# Patient Record
Sex: Female | Born: 1965
Health system: Southern US, Community
[De-identification: ages and names within clinical notes are randomized; demographics above are authoritative.]

## PROBLEM LIST (undated history)

## (undated) DIAGNOSIS — F419 Anxiety disorder, unspecified: Secondary | ICD-10-CM

## (undated) DIAGNOSIS — M797 Fibromyalgia: Secondary | ICD-10-CM

## (undated) DIAGNOSIS — G43909 Migraine, unspecified, not intractable, without status migrainosus: Secondary | ICD-10-CM

## (undated) DIAGNOSIS — M810 Age-related osteoporosis without current pathological fracture: Secondary | ICD-10-CM

## (undated) DIAGNOSIS — M1990 Unspecified osteoarthritis, unspecified site: Secondary | ICD-10-CM

## (undated) DIAGNOSIS — E079 Disorder of thyroid, unspecified: Secondary | ICD-10-CM

## (undated) DIAGNOSIS — K219 Gastro-esophageal reflux disease without esophagitis: Secondary | ICD-10-CM

## (undated) HISTORY — PX: TUBAL LIGATION: SHX77

## (undated) HISTORY — DX: Gastro-esophageal reflux disease without esophagitis: K21.9

## (undated) HISTORY — DX: Anxiety disorder, unspecified: F41.9

## (undated) HISTORY — DX: Age-related osteoporosis without current pathological fracture: M81.0

## (undated) HISTORY — PX: TONSILLECTOMY: SUR1361

## (undated) HISTORY — PX: OTHER SURGICAL HISTORY: SHX169

## (undated) HISTORY — DX: Unspecified osteoarthritis, unspecified site: M19.90

---

## 2001-07-23 ENCOUNTER — Encounter: Payer: Self-pay | Admitting: Emergency Medicine

## 2001-07-23 ENCOUNTER — Emergency Department (HOSPITAL_COMMUNITY): Admission: EM | Admit: 2001-07-23 | Discharge: 2001-07-23 | Payer: Self-pay | Admitting: Emergency Medicine

## 2002-10-24 HISTORY — PX: OTHER SURGICAL HISTORY: SHX169

## 2003-05-15 ENCOUNTER — Other Ambulatory Visit: Admission: RE | Admit: 2003-05-15 | Discharge: 2003-05-15 | Payer: Self-pay | Admitting: Obstetrics and Gynecology

## 2004-01-14 ENCOUNTER — Emergency Department (HOSPITAL_COMMUNITY): Admission: EM | Admit: 2004-01-14 | Discharge: 2004-01-14 | Payer: Self-pay | Admitting: Emergency Medicine

## 2004-06-07 ENCOUNTER — Other Ambulatory Visit: Admission: RE | Admit: 2004-06-07 | Discharge: 2004-06-07 | Payer: Self-pay | Admitting: Obstetrics and Gynecology

## 2004-08-24 ENCOUNTER — Ambulatory Visit (HOSPITAL_COMMUNITY): Admission: RE | Admit: 2004-08-24 | Discharge: 2004-08-24 | Payer: Self-pay | Admitting: Internal Medicine

## 2005-05-19 ENCOUNTER — Encounter (INDEPENDENT_AMBULATORY_CARE_PROVIDER_SITE_OTHER): Payer: Self-pay | Admitting: Specialist

## 2005-05-19 ENCOUNTER — Ambulatory Visit (HOSPITAL_COMMUNITY): Admission: RE | Admit: 2005-05-19 | Discharge: 2005-05-19 | Payer: Self-pay | Admitting: Obstetrics and Gynecology

## 2005-10-05 ENCOUNTER — Emergency Department (HOSPITAL_COMMUNITY): Admission: EM | Admit: 2005-10-05 | Discharge: 2005-10-05 | Payer: Self-pay | Admitting: Emergency Medicine

## 2005-11-02 ENCOUNTER — Other Ambulatory Visit: Admission: RE | Admit: 2005-11-02 | Discharge: 2005-11-02 | Payer: Self-pay | Admitting: Obstetrics and Gynecology

## 2006-01-29 ENCOUNTER — Emergency Department (HOSPITAL_COMMUNITY): Admission: EM | Admit: 2006-01-29 | Discharge: 2006-01-29 | Payer: Self-pay | Admitting: Emergency Medicine

## 2006-03-21 ENCOUNTER — Ambulatory Visit (HOSPITAL_COMMUNITY): Admission: RE | Admit: 2006-03-21 | Discharge: 2006-03-21 | Payer: Self-pay | Admitting: Internal Medicine

## 2006-08-10 ENCOUNTER — Ambulatory Visit (HOSPITAL_COMMUNITY): Admission: RE | Admit: 2006-08-10 | Discharge: 2006-08-10 | Payer: Self-pay | Admitting: Internal Medicine

## 2007-05-23 ENCOUNTER — Ambulatory Visit: Payer: Self-pay | Admitting: Internal Medicine

## 2007-06-03 ENCOUNTER — Emergency Department (HOSPITAL_COMMUNITY): Admission: EM | Admit: 2007-06-03 | Discharge: 2007-06-03 | Payer: Self-pay | Admitting: Emergency Medicine

## 2007-07-05 ENCOUNTER — Ambulatory Visit: Payer: Self-pay | Admitting: Surgery

## 2007-07-05 ENCOUNTER — Ambulatory Visit (HOSPITAL_COMMUNITY): Admission: RE | Admit: 2007-07-05 | Discharge: 2007-07-05 | Payer: Self-pay | Admitting: Orthopaedic Surgery

## 2007-09-06 ENCOUNTER — Encounter
Admission: RE | Admit: 2007-09-06 | Discharge: 2007-09-06 | Payer: Self-pay | Admitting: Physical Medicine and Rehabilitation

## 2007-11-22 ENCOUNTER — Ambulatory Visit (HOSPITAL_COMMUNITY): Admission: RE | Admit: 2007-11-22 | Discharge: 2007-11-22 | Payer: Self-pay | Admitting: Internal Medicine

## 2008-07-02 ENCOUNTER — Encounter (HOSPITAL_COMMUNITY): Admission: RE | Admit: 2008-07-02 | Discharge: 2008-07-21 | Payer: Self-pay | Admitting: Sports Medicine

## 2008-10-30 ENCOUNTER — Ambulatory Visit (HOSPITAL_COMMUNITY): Admission: RE | Admit: 2008-10-30 | Discharge: 2008-10-30 | Payer: Self-pay | Admitting: Internal Medicine

## 2009-01-03 ENCOUNTER — Emergency Department (HOSPITAL_COMMUNITY): Admission: EM | Admit: 2009-01-03 | Discharge: 2009-01-03 | Payer: Self-pay | Admitting: Emergency Medicine

## 2010-03-25 ENCOUNTER — Ambulatory Visit (HOSPITAL_COMMUNITY): Admission: RE | Admit: 2010-03-25 | Discharge: 2010-03-25 | Payer: Self-pay | Admitting: Family Medicine

## 2011-01-31 ENCOUNTER — Other Ambulatory Visit (HOSPITAL_COMMUNITY): Payer: Self-pay | Admitting: Family Medicine

## 2011-02-01 ENCOUNTER — Ambulatory Visit (HOSPITAL_COMMUNITY)
Admission: RE | Admit: 2011-02-01 | Discharge: 2011-02-01 | Disposition: A | Discharge status: Home / Self Care | Payer: Self-pay | Source: Ambulatory Visit | Attending: Family Medicine | Admitting: Family Medicine

## 2011-02-01 ENCOUNTER — Other Ambulatory Visit (HOSPITAL_COMMUNITY): Payer: Self-pay | Admitting: Family Medicine

## 2011-02-01 DIAGNOSIS — R1032 Left lower quadrant pain: Secondary | ICD-10-CM | POA: Insufficient documentation

## 2011-02-03 ENCOUNTER — Ambulatory Visit (HOSPITAL_COMMUNITY): Payer: Self-pay

## 2011-03-08 NOTE — Assessment & Plan Note (Signed)
[facility]                             PULMONARY OFFICE NOTE   JAKARA, BLATTER                  MRN:          [id number]  DATE:05/23/2007                            DOB:          Jun 02, 1966    REASON FOR CONSULTATION:  Pain in the back and abnormal chest x-ray.   HISTORY:  This is a 45 year old white female who has developed chest  discomfort for the last two years that is midline in nature, present 24  hours a day and not related to coughing or deep breathing.  It seems  better when she does heat or massage.  She comes in today because she is  having increasing symptoms of morning cough and congestion and  increasing dyspnea on her treadmill after resuming smoking two months  ago.  She states prior to that she had better exercise tolerance and no  cough at all.  She is concerned because she had pneumonia a year ago.  She had normal chest x-ray and PFTs a year ago but has not had any  follow-up.   PAST MEDICAL HISTORY:  Significant for headaches only. She sees a  psychiatrist.  Could not tell me his name initially and does not  actually know what diagnosis she is being followed for.   ALLERGIES:  NO KNOWN DRUG ALLERGIES.   MEDICATIONS:  Multivitamins, Lamictal and Depakote.   SOCIAL HISTORY:  She quit smoking a year ago but then resumed two months  ago which corresponds to when her symptoms started.   FAMILY HISTORY:  Positive for heart disease in her father.   REVIEW OF SYSTEMS:  Taken in detail and negative except as outlined  above.   PHYSICAL EXAMINATION:  VITAL SIGNS:  Afebrile with stable vital signs.  GENERAL APPEARANCE:  This is a somber and anxious, but not overtly  depressed-appearing ambulatory white female in no acute distress.  HEENT:  Unremarkable.  Oropharynx clear. Dentition intact.  Nasal  turbinates reveal minimum nonspecific turbinate edema.  Ear canals were  clear bilaterally.  NECK:  Supple without cervical  adenopathy or tenderness.  Trachea  midline.  No thyromegaly.  LUNGS:  Lung fields perfectly clear to auscultation and percussion  bilaterally with excellent air movement.  CARDIOVASCULAR:  There is regular rhythm without murmurs, rubs, or  gallops.  ABDOMEN:  Soft, benign.  EXTREMITIES:  Warm without calf tenderness, clubbing, cyanosis, or  edema.   IMPRESSION:  I was not exactly sure why this patient was here today.  She clearly understands that the cigarettes are chronologically directly  related to the symptoms that she is having, namely morning cough and  congestion that clears fairly readily after she stirs around and  worsening exercise tolerance on a treadmill.  Rather than treat her with  medicines to correct this, I think it makes more sense for her to return  to her psychiatrist for help quitting smoking.  She indeed does seem  motivated but said the reason she resumed smoking is because she could  not handle the stress which is exactly why she should discuss this with  her psychiatrist over her  pulmonologist.   I told her that no amount of screening with chest x-rays and pulmonary  function tests will be as important as her commitment and follow-through  on smoking cessation but I would be happy to perform a set of pulmonary  function tests and chest x-ray here to reassure her that the studies  done in [place], [place], were accurate (I reviewed them with  her today and find that she had a normal chest x-ray and pulmonary  function tests a year ago so by extrapolation it would be unlikely she  has permanent lung injury from smoking which was her main concern).  In terms of her back pain, it has been present for two years.  It is  midline in nature and is not effected by breathing or coughing, twisting  or turning.  I thought it was a bit peculiar in that it is present 24  hours a day without change except better with heat or massage, strongly  suggesting a  musculoskeletal component.  It is midline in nature, not  lateralizing and therefore, not likely to be related to the thoracic  cage and certainly not of pulmonary etiology and therefore does not need  any further evaluation at this point.     Charlaine Dalton. Sherene Sires, MD, [facility]  Electronically Signed    MBW/MedQ  DD: 05/23/2007  DT: 05/24/2007  Job #: [id number]   cc:   Kingsley Callander. Ouida Sills, MD  Jetty Duhamel., M.D.

## 2011-03-11 NOTE — Op Note (Signed)
Mary [name], Mary [name]           ACCOUNT NO.:  [id number]   MEDICAL RECORD NO.:  [id number]          PATIENT TYPE:  AMB   LOCATION:  SDC                           FACILITY:  [facility]   PHYSICIAN:  Juluis Mire, M.D.   DATE OF BIRTH:  11-07-1965   DATE OF PROCEDURE:  05/19/2005  DATE OF DISCHARGE:                                 OPERATIVE REPORT   PREOPERATIVE DIAGNOSES:  1.  Pelvic pain.  2.  Menorrhagia.   POSTOPERATIVE DIAGNOSES:  1.  Uterine adenomyosis.  2.  Pelvic adhesion.   OPERATIVE PROCEDURE:  1.  Open laparoscopy.  2.  Lysis of adhesion.  3.  Hysteroscopy.  4.  Endometrial curettings.  5.  NovaSure endometrial ablation.   SURGEON:  Dr. Arelia Sneddon   ANESTHESIA:  General.   ESTIMATED BLOOD LOSS:  Minimal.   PACKS AND DRAINS:  None.   INTRAOPERATIVE BLOOD REPLACEMENT AND COMPLICATIONS:  None.   INDICATIONS:  Dictated in history and physical.   DESCRIPTION OF PROCEDURE:  The patient was taken to the OR and placed in  supine position.  After satisfactory level of general endotracheal  anesthesia was obtained, the patient was placed in the dorsal lithotomy  position using the Allen stirrups.  Abdomen, perineum, and vagina were  prepped out with Betadine.  Bladder was emptied by in-and-out  catheterization.  A Hulka tenaculum put in place and secured.  The patient  was then draped out for laparoscopy.  Subumbilical incision was made with a  knife and carried through the subcutaneous tissue.  Fascia was entered  sharply and the incision in the fascia extended laterally.  Muscles were  separated in the midline.  The peritoneum was entered with digital pressure.  __________ laparoscopic trocar was put in place and secured.  Abdomen was  inflated with carbon dioxide.  Laparoscope was introduced.  There was no  evidence of injury to adjacent organs.  A 5 mm trocar was put in place in  the suprapubic area under direct visualization.  Visualization revealed a  markedly  enlarged uterus, consistent with adenomyosis.  Previous bilateral  tubal ligation.  Functional cysts bilaterally.  Did have adhesions from the  left pelvic sidewall to the left ovary.  These were taken down using the  scissors.  We used cautery to bring about hemostasis.  The ovaries are  otherwise unremarkable.  This did completely free up the left side.  Appendix was visualized and noted to be normal.  __________ gallbladder were  clear.  We had no active bleeding or signs of injury to adjacent organs.  The abdomen was deflated of its carbon dioxide.  Trocars were removed.  Subumbilical fascia was closed with two figure-of-eights of 0 Vicryl, skin  with interrupted subcuticulars of 4-0 Vicryl.  The suprapubic incisions were  closed with Dermabond.   The patient's legs were repositioned.  The patient was made ready for  hysteroscopy.  Speculum was placed in the vaginal vault.  The Hulka  tenaculums had been removed.  Cervix was grasped with single-tooth  tenaculum.  Uterine depth was 9 cm.  Endocervical length was 5 cm.  Cervix  serially dilated to a size 25 Pratt dilator.  Nonoperative hysteroscope was  introduced.  The intrauterine cavity was distended using lactated Ringer's.  Visualization revealed a normal endometrial cavity.  There was no evidence  of polyps or other abnormalities.  Endometrial curettings were then  obtained.  The NovaSure was brought in place, properly inserted.  It was  expanded.  Total cavity length was 3.7 cm.  We then passed the carbon  dioxide patency test.  The NovaSure was then fired at a power of 81 for 1  minute and 9 seconds.  NovaSure was then removed intact.  Rehysteroscopic  evaluation revealed complete ablation of the endometrium.  There were no  signs of perforation or other complications.  Total deficit from  hysteroscopy was 30 mL.  At this point in time, the single-tooth tenaculum  and speculum were then removed.  The patient taken out of the  dorsal  lithotomy position, once alert and extubated, transferred to the recovery  room in good condition.  Sponge, instrument, and needle count reported as  correct by the circulating nurse.       JSM/MEDQ  D:  05/19/2005  T:  05/19/2005  Job:  [id number]

## 2011-03-11 NOTE — H&P (Signed)
NAMELEITHA, [name] NO.:  [id number]   MEDICAL RECORD NO.:  [id number]          PATIENT TYPE:  AMB   LOCATION:  SDC                           FACILITY:  [facility]   PHYSICIAN:  Juluis Mire, M.D.   DATE OF BIRTH:  [birth year]   DATE OF ADMISSION:  05/19/2005  DATE OF DISCHARGE:                                HISTORY & PHYSICAL   The patient is a 45 year old gravida 6, para 3, abortus 3, married female  who presents for diagnostic laparoscopy with laser stand-by as well as  hysteroscopy and NovaSure ablation.   In relation to the present admission, the patient has had trouble with  chronic pelvic pain and discomfort.  Ultrasound evaluation has been  basically unremarkable.  We presumptively thought we were dealing with  pelvic endometriosis.  We are going to proceed with laparoscopic evaluation  for management.   The patient also describes menorrhagia.  Her cycles are regular.  She has  four to five days of flow, three days being heavy, changing pads and tampons  every hour, with clots and increasing dysmenorrhea.  Saline infusion  ultrasound was basically unremarkable.  We had discussed options, including  birth control pills versus Mirena IUD versus conservative follow-up.  The  patient decided to proceed with ablation at the time of hysteroscopy.   In terms of allergies, the patient has no known drug allergies.   MEDICATIONS:  None.   PAST MEDICAL HISTORY:  Usual childhood diseases.   PREVIOUS SURGICAL HISTORY:  Bilateral tubal ligation.   OBSTETRICAL HISTORY:  She has had three vaginal deliveries and three  miscarriages.   FAMILY HISTORY:  Noncontributory.   SOCIAL HISTORY:  No tobacco or alcohol use.   REVIEW OF SYSTEMS:  Noncontributory.   PHYSICAL EXAMINATION:  VITAL SIGNS:  The patient is afebrile with stable  vital signs.  HEENT:  Patient normocephalic.  Pupils equal, round, and reactive to light  and accommodation, extraocular movements were  intact.  Sclerae and  conjunctivae clear.  NECK:  Without thyromegaly.  BREASTS:  No discrete masses.  CHEST:  Lungs clear.  CARDIAC:  Regular rhythm and rate, without murmurs or gallops.  ABDOMEN:  Benign.  No mass, organomegaly or tenderness.  PELVIC:  Normal external genitalia.  Vaginal mucosa clear.  Cervix  unremarkable.  Uterus upper limits of normal size, moderately boggy,  possible adenomyosis.  Adnexa unremarkable.  EXTREMITIES:  Trace edema.  NEUROLOGIC:  Grossly within normal limits.   IMPRESSION:  Menorrhagia with associated pelvic pain, possible endometriosis  versus adenomyosis.   PLAN:  The patient will undergo diagnostic laparoscopy with laser stand-by,  subsequent hysteroscopy and NovaSure ablation.  Success rate for ablation of  80% is quoted.  The risks of surgery are explained, including the risk of  infection; the risk of vascular injury that could lead to hemorrhage  requiring transfusion of possible hysterectomy; risk of injury to adjacent  organs that could require further exploratory surgery; the risk of deep  venous thrombosis and pulmonary embolus.  The patient expressed an  understanding of indications and, indeed, risks.  JSM/MEDQ  D:  05/19/2005  T:  05/19/2005  Job:  [id number]

## 2011-03-11 NOTE — Procedures (Signed)
Mary [name], Mary [name]           ACCOUNT NO.:  [id number]   MEDICAL RECORD NO.:  [id number]           PATIENT TYPE:   LOCATION:                                 FACILITY:   PHYSICIAN:  Edward L. Juanetta Gosling, M.D.     DATE OF BIRTH:   DATE OF PROCEDURE:  DATE OF DISCHARGE:                              PULMONARY FUNCTION TEST   PROCEDURE:  Pulmonary function test.   INTERPRETATION:  1.  Spirometry is normal.  2.  Lung volumes are normal but there is slight increase in residual volume.  3.  DLCO is normal.     Edwa   ELH/MEDQ  D:  08/30/2004  T:  08/30/2004  Job:  [id number]   cc:   Vickki Hearing, M.D.  Fax: [phone]

## 2011-04-28 ENCOUNTER — Emergency Department (HOSPITAL_COMMUNITY)
Admission: EM | Admit: 2011-04-28 | Discharge: 2011-04-29 | Disposition: A | Discharge status: Home / Self Care | Payer: Worker's Compensation | Attending: Emergency Medicine | Admitting: Emergency Medicine

## 2011-04-28 ENCOUNTER — Emergency Department (HOSPITAL_COMMUNITY): Payer: Worker's Compensation

## 2011-04-28 DIAGNOSIS — S63509A Unspecified sprain of unspecified wrist, initial encounter: Secondary | ICD-10-CM | POA: Insufficient documentation

## 2011-04-28 DIAGNOSIS — Z79899 Other long term (current) drug therapy: Secondary | ICD-10-CM | POA: Insufficient documentation

## 2011-04-28 DIAGNOSIS — W010XXA Fall on same level from slipping, tripping and stumbling without subsequent striking against object, initial encounter: Secondary | ICD-10-CM | POA: Insufficient documentation

## 2011-04-28 DIAGNOSIS — F411 Generalized anxiety disorder: Secondary | ICD-10-CM | POA: Insufficient documentation

## 2011-04-28 DIAGNOSIS — F319 Bipolar disorder, unspecified: Secondary | ICD-10-CM | POA: Insufficient documentation

## 2011-07-08 ENCOUNTER — Other Ambulatory Visit: Payer: Self-pay

## 2011-07-08 ENCOUNTER — Emergency Department (HOSPITAL_COMMUNITY): Payer: BC Managed Care – PPO

## 2011-07-08 ENCOUNTER — Encounter: Payer: Self-pay | Admitting: Emergency Medicine

## 2011-07-08 ENCOUNTER — Emergency Department (HOSPITAL_COMMUNITY)
Admission: EM | Admit: 2011-07-08 | Discharge: 2011-07-08 | Disposition: A | Discharge status: Home / Self Care | Payer: BC Managed Care – PPO | Attending: Emergency Medicine | Admitting: Emergency Medicine

## 2011-07-08 DIAGNOSIS — IMO0001 Reserved for inherently not codable concepts without codable children: Secondary | ICD-10-CM | POA: Insufficient documentation

## 2011-07-08 DIAGNOSIS — J40 Bronchitis, not specified as acute or chronic: Secondary | ICD-10-CM | POA: Insufficient documentation

## 2011-07-08 DIAGNOSIS — F17200 Nicotine dependence, unspecified, uncomplicated: Secondary | ICD-10-CM | POA: Insufficient documentation

## 2011-07-08 HISTORY — DX: Fibromyalgia: M79.7

## 2011-07-08 MED ORDER — PREDNISONE 20 MG PO TABS
20.0000 mg | ORAL_TABLET | Freq: Once | ORAL | Status: AC
  Administered 2011-07-08: 20 mg via ORAL
  Filled 2011-07-08: qty 1

## 2011-07-08 MED ORDER — PREDNISONE 10 MG PO TABS: 20.0000 mg | ORAL_TABLET | Freq: Every day | ORAL | Status: AC

## 2011-07-08 NOTE — ED Provider Notes (Signed)
History   Scribed for Benny Lennert, MD, the patient was seen in room APA02/APA02. This chart was scribed by Clarita Crane. This patient's care was started at 1:58PM.  CSN: [id number] Arrival date & time: 07/08/2011 11:37 AM  Chief Complaint  Patient presents with  . Chest Pain  . Cough    HPI Mary [name] is a 45 y.o. female who presents to the Emergency Department complaining of cough and burning chest pain which is worse with coughing onset several days ago and persistent since with associated myalgias, fatigue, cough, back pain, left shoulder pain and numbness. Patient reports she was evaluated by PCP 3 days ago and prescribed a Codeine cough syrup with mild relief of cough and ore throat but no relief provided to additional symptoms. Patient is a current smoker. H/o fibromyalgia.   HPI ELEMENTS: Onset: several days ago Duration: persistent since onset  Timing: constant   Quality: chest pain described as burning Modifying factors: chest pain aggravated by coughing Context:  as above  Associated symptoms: +myalgias, fatigue, cough, back pain, left shoulder pain and numbness.Marland Kitchen  PAST MEDICAL HISTORY:  Past Medical History  Diagnosis Date  . Fibromyalgia     PAST SURGICAL HISTORY:  Past Surgical History  Procedure Date  . Tubal ligation   . Tonsillectomy     FAMILY HISTORY:  History reviewed. No pertinent family history.   SOCIAL HISTORY: History   Social History  . Marital Status: Married    Spouse Name: N/A    Number of Children: N/A  . Years of Education: N/A   Social History Main Topics  . Smoking status: Current Some Day Smoker  . Smokeless tobacco: None  . Alcohol Use: No  . Drug Use: No  . Sexually Active: Yes    Birth Control/ Protection: Surgical   Other Topics Concern  . None   Social History Narrative  . None     Review of Systems  Constitutional: Positive for fatigue. Negative for fever.  HENT: Negative for congestion, sinus pressure  and ear discharge.   Eyes: Negative for discharge.  Respiratory: Positive for cough.   Cardiovascular: Positive for chest pain.  Gastrointestinal: Negative for abdominal pain and diarrhea.  Genitourinary: Negative for frequency and hematuria.  Musculoskeletal: Positive for myalgias and back pain.       Left shoulder pain  Skin: Negative for rash.  Neurological: Positive for numbness. Negative for seizures and headaches.  Hematological: Negative.   Psychiatric/Behavioral: Negative for hallucinations.    Allergies  Review of patient's allergies indicates no known allergies.  Home Medications   Current Outpatient Rx  Name Route Sig Dispense Refill  . ACETAMINOPHEN 500 MG PO TABS Oral Take 1,000 mg by mouth every 6 (six) hours as needed. Pain     . AZITHROMYCIN 250 MG PO TABS Oral Take 250 mg by mouth daily. Take 2 tablets by mouth one first day then take 1 tablet by mouth for 4 days.     . ESCITALOPRAM OXALATE 20 MG PO TABS Oral Take 20 mg by mouth daily.      Marland Kitchen HYDROCODONE-HOMATROPINE 5-1.5 MG/5ML PO SYRP Oral Take 5 mLs by mouth every 6 (six) hours as needed. Congestion     . ALIVE WOMENS ENERGY PO TABS Oral Take 1 tablet by mouth daily.        Physical Exam    BP 105/55  Pulse 65  Temp(Src) 98.1 F (36.7 C) (Oral)  Resp 18  Ht 5\' 4"  (1.626 m)  Wt 170 lb (77.111 kg)  BMI 29.18 kg/m2  SpO2 99%  Physical Exam  Nursing note and vitals reviewed. Constitutional: She is oriented to person, place, and time. She appears well-developed and well-nourished. No distress.  HENT:  Head: Normocephalic and atraumatic.  Mouth/Throat: Oropharynx is clear and moist.  Eyes: Conjunctivae and EOM are normal. No scleral icterus.  Neck: Neck supple. No thyromegaly present.  Cardiovascular: Normal rate and regular rhythm.  Exam reveals no gallop and no friction rub.   No murmur heard. Pulmonary/Chest: Effort normal. No stridor. She has wheezes (minimal in bilateral fields). She has no rales.  She exhibits no tenderness.  Abdominal: She exhibits no distension. There is no tenderness. There is no rebound.  Musculoskeletal: Normal range of motion. She exhibits no edema.  Lymphadenopathy:    She has no cervical adenopathy.  Neurological: She is oriented to person, place, and time. Coordination normal.  Skin: Skin is warm and dry.  Psychiatric: She has a normal mood and affect. Her behavior is normal.    ED Course  Procedures  OTHER DATA REVIEWED: Nursing notes, vital signs, and past medical records reviewed. Lab results reviewed and considered Imaging results reviewed and considered  DIAGNOSTIC STUDIES:   LABS / RADIOLOGY: No results found for this or any previous visit. Dg Chest 2 View  07/08/2011  *RADIOLOGY REPORT*  Clinical Data: Chest pain with nonproductive cough for 1 week. Smoker  CHEST - 2 VIEW  Comparison: 10/30/2008  Findings: Heart and mediastinal contours are within normal limits. The lung fields are well expanded and appear clear with no signs of focal infiltrate or congestive failure.  No pleural fluid or peribronchial cuffing is seen.  Bony structures appear intact  IMPRESSION: Stable cardiopulmonary appearance with no worrisome focal focal or acute abnormality identified.  Original Report Authenticated By: Bertha Stakes, M.D.    ED COURSE / COORDINATION OF CARE: Orders Placed This Encounter  Procedures  . DG Chest 2 View  . Cardiac monitoring  . ED EKG     MDM:    PLAN: Discharge Home The patient is to return the emergency department if there is any worsening of symptoms. I have reviewed the discharge instructions with the patient/family  CONDITION ON DISCHARGE: Good  MEDICATIONS GIVEN IN THE E.D.  Medications  azithromycin (ZITHROMAX) 250 MG tablet (not administered)  acetaminophen (TYLENOL) 500 MG tablet (not administered)  Multiple Vitamins-Minerals (ALIVE WOMENS ENERGY) TABS (not administered)  escitalopram (LEXAPRO) 20 MG tablet  (not administered)  HYDROcodone-homatropine (HYCODAN) 5-1.5 MG/5ML syrup (not administered)  predniSONE (DELTASONE) tablet 20 mg (not administered)       The chart was scribed for me under my direct supervision.  I personally performed the history, physical, and medical decision making and all procedures in the evaluation of this patient.Marland Kitchen   ement}  Benny Lennert, MD 07/08/11 [phone]

## 2011-07-08 NOTE — ED Notes (Signed)
Patient sent directly to ER from doctors office for midsternal chest pain that radiates into back and down left arm and dizziness. Patient reports nausea, denies any vomiting. Patient having some shortness of breath.  Patient seen at [facility]P on Tuesday for flu. Cough noted. Patient reports having heartburn last night. Pain worse in chest with movement.

## 2011-07-08 NOTE — ED Notes (Signed)
Per Dr. Heywood Iles labs until seen by him. Advised lab and pt.

## 2011-08-15 ENCOUNTER — Other Ambulatory Visit (HOSPITAL_COMMUNITY): Payer: Self-pay | Admitting: Obstetrics and Gynecology

## 2011-08-15 DIAGNOSIS — Z139 Encounter for screening, unspecified: Secondary | ICD-10-CM

## 2011-08-19 ENCOUNTER — Ambulatory Visit (HOSPITAL_COMMUNITY)
Admission: RE | Admit: 2011-08-19 | Discharge: 2011-08-19 | Disposition: A | Discharge status: Home / Self Care | Payer: BC Managed Care – PPO | Source: Ambulatory Visit | Attending: Obstetrics and Gynecology | Admitting: Obstetrics and Gynecology

## 2011-08-19 DIAGNOSIS — Z139 Encounter for screening, unspecified: Secondary | ICD-10-CM

## 2011-08-19 DIAGNOSIS — Z1231 Encounter for screening mammogram for malignant neoplasm of breast: Secondary | ICD-10-CM | POA: Insufficient documentation

## 2011-09-20 ENCOUNTER — Other Ambulatory Visit (HOSPITAL_COMMUNITY): Payer: Self-pay | Admitting: "Endocrinology

## 2011-09-20 DIAGNOSIS — E049 Nontoxic goiter, unspecified: Secondary | ICD-10-CM

## 2011-09-26 ENCOUNTER — Ambulatory Visit (HOSPITAL_COMMUNITY)
Admission: RE | Admit: 2011-09-26 | Discharge: 2011-09-26 | Disposition: A | Discharge status: Home / Self Care | Payer: BC Managed Care – PPO | Source: Ambulatory Visit | Attending: "Endocrinology | Admitting: "Endocrinology

## 2011-09-26 DIAGNOSIS — E049 Nontoxic goiter, unspecified: Secondary | ICD-10-CM

## 2011-11-29 IMAGING — MG MM DIGITAL SCREENING BILAT W/ CAD
5 series · 5 of 5 positions shown · non-contrast
Comparison: none

DG SCREEN MAMMOGRAM BILATERAL
Bilateral CC and MLO view(s) were taken.
Technologist: [REDACTED]

DIGITAL SCREENING MAMMOGRAM WITH CAD:
The breast tissue is almost entirely fatty.  No masses or malignant type calcifications are 
identified.
Images were processed with CAD.

[L CC]
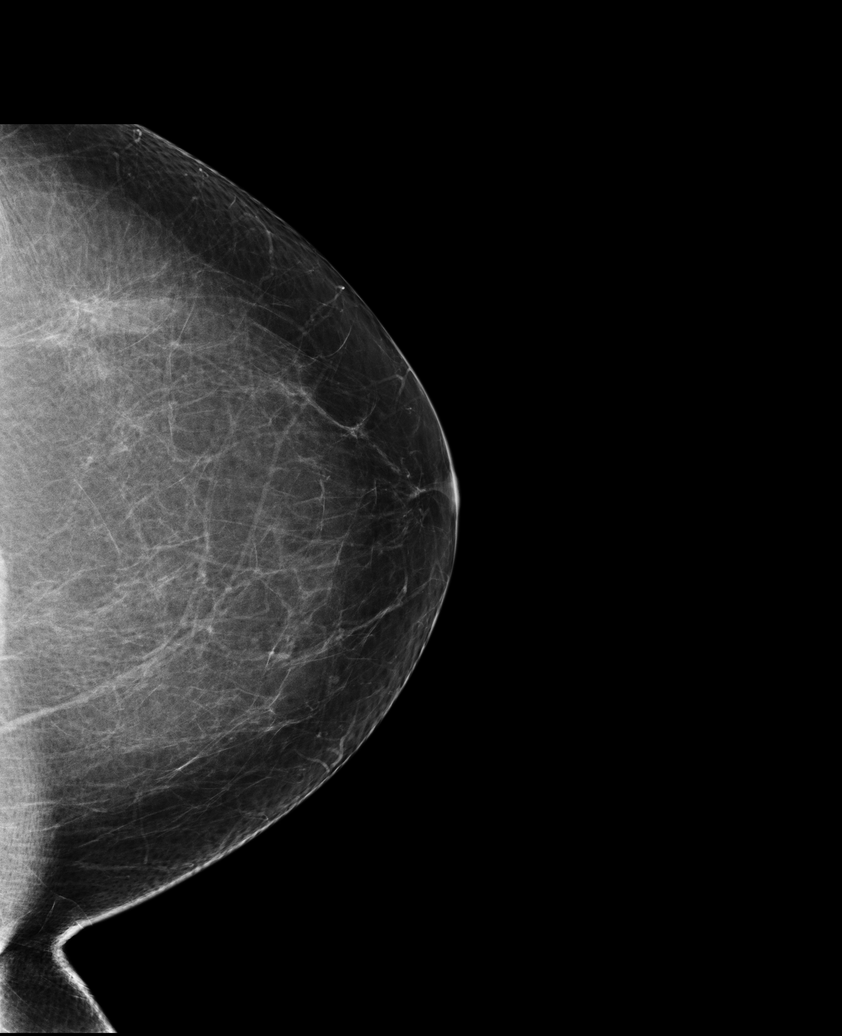

[L MLO]
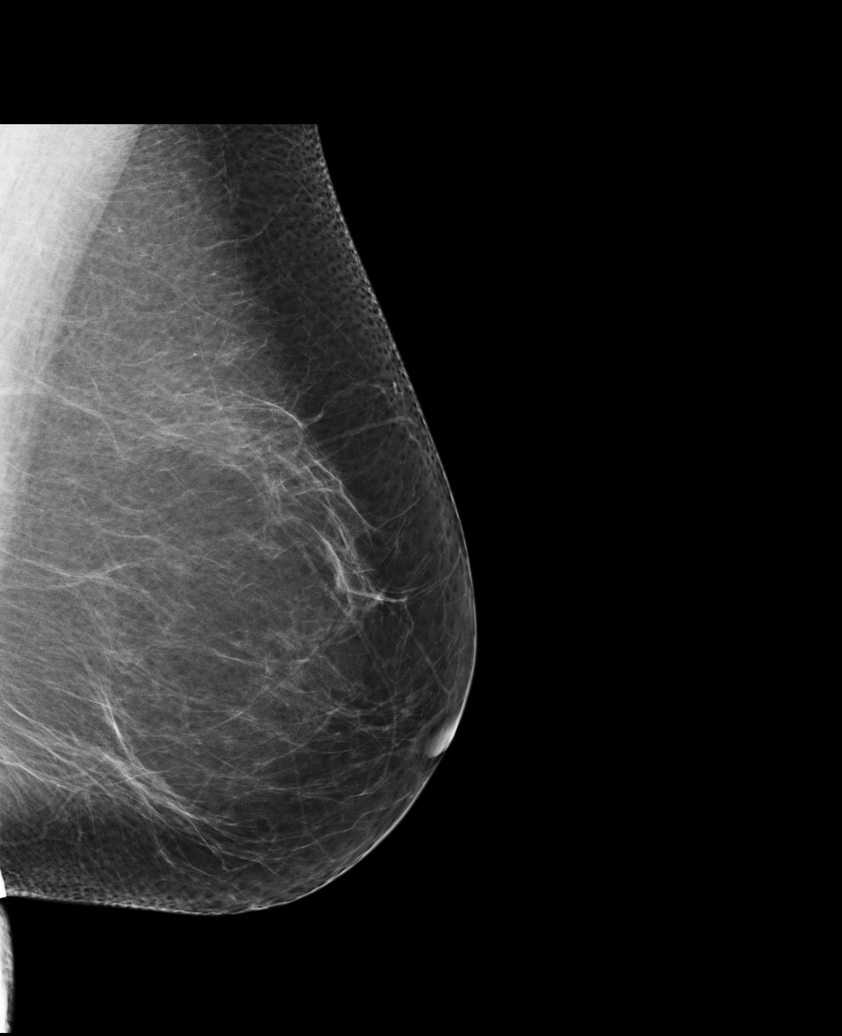

[R CC]
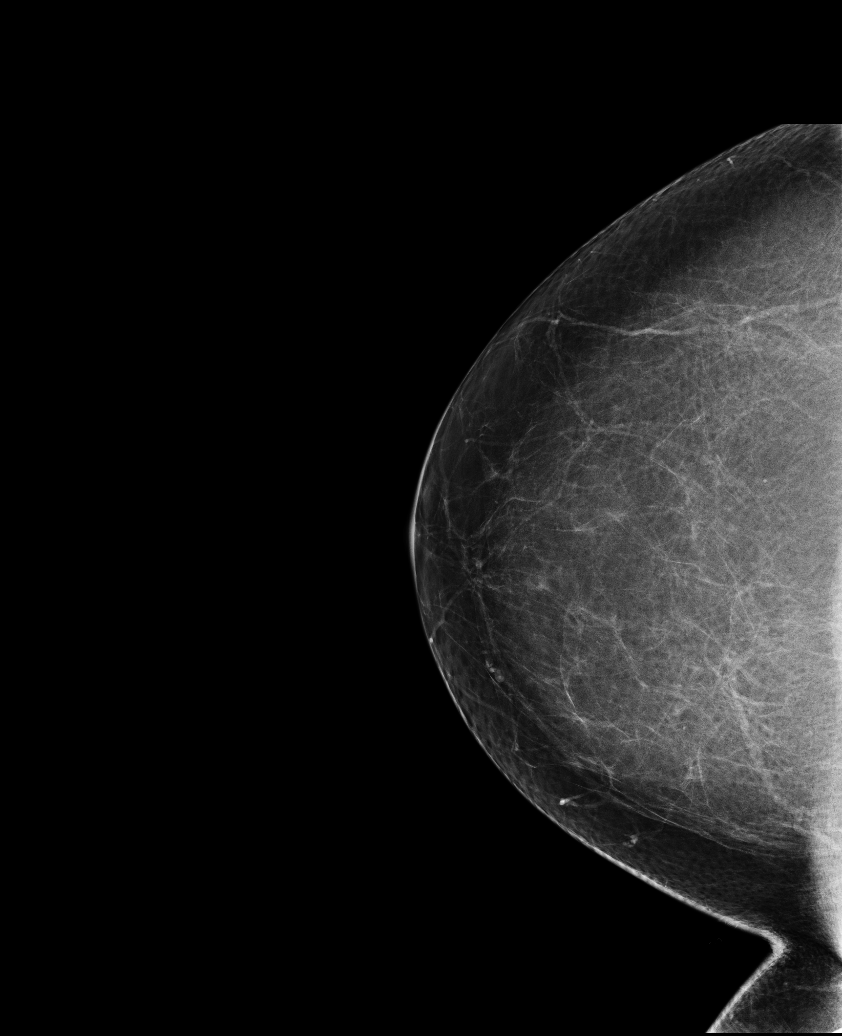

[R MLO (1 of 2)]
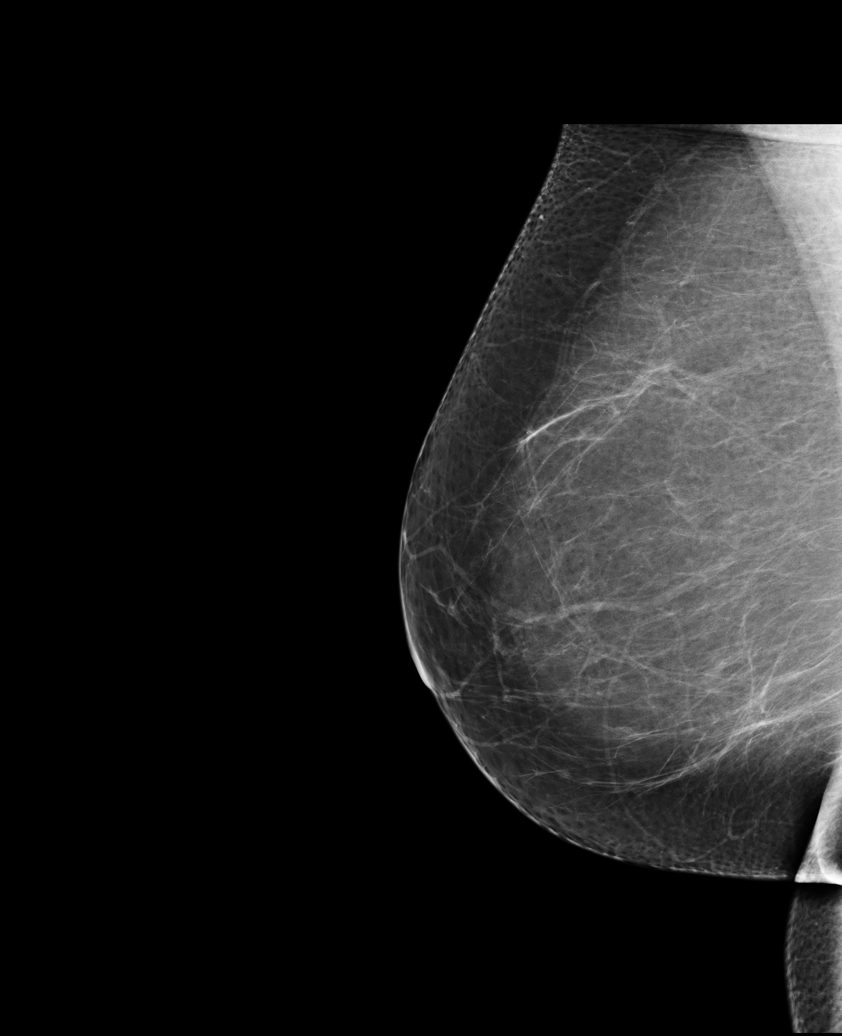

[R MLO (2 of 2)]
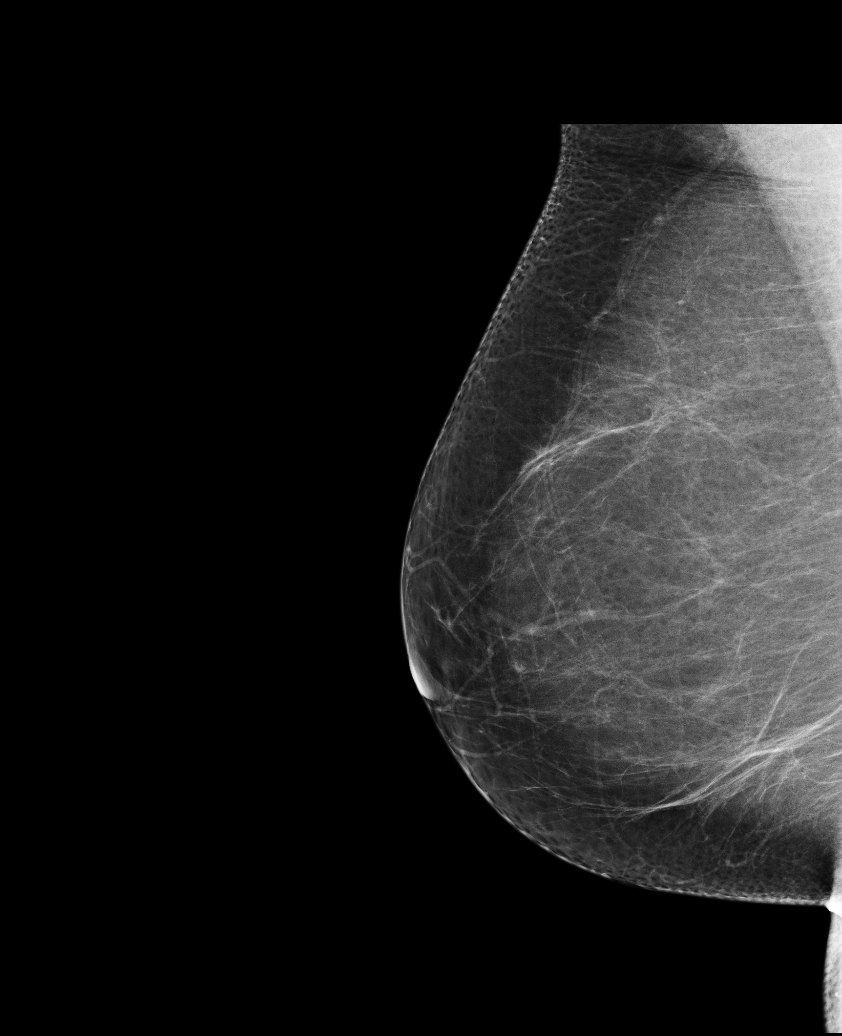

[5 of 5 positions shown; findings below may reference images not displayed]

IMPRESSION: No specific mammographic evidence of malignancy.  Next screening mammogram is recommended in one 
year.

A result letter of this screening mammogram will be mailed directly to the patient.

ASSESSMENT: Negative - BI-RADS 1

Screening mammogram in 1 year.
,

## 2012-01-06 IMAGING — US US SOFT TISSUE HEAD/NECK
1 series · 14 of 25 positions shown · non-contrast
Comparison: CT scan of the cervical spine dated 01/03/2009

CLINICAL DATA: Goiter.

THYROID ULTRASOUND
TECHNIQUE: Ultrasound examination of the thyroid gland and adjacent
soft tissues was performed.

[Series 1: us soft tissue head/neck · 0.08mm/px · 14 of 33 slices shown]
[im 1/33]
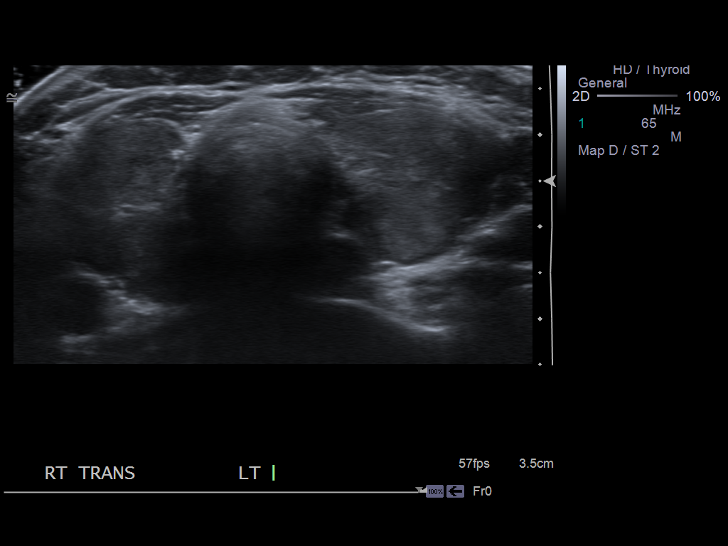
[im 3/33]
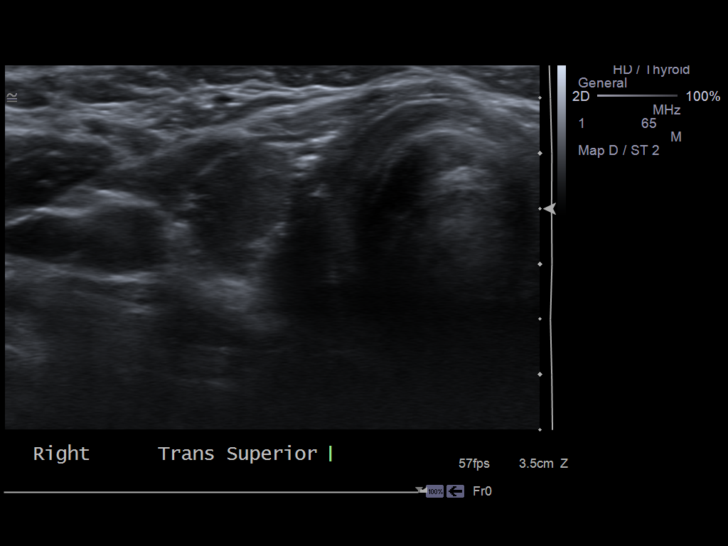
[im 6/33]
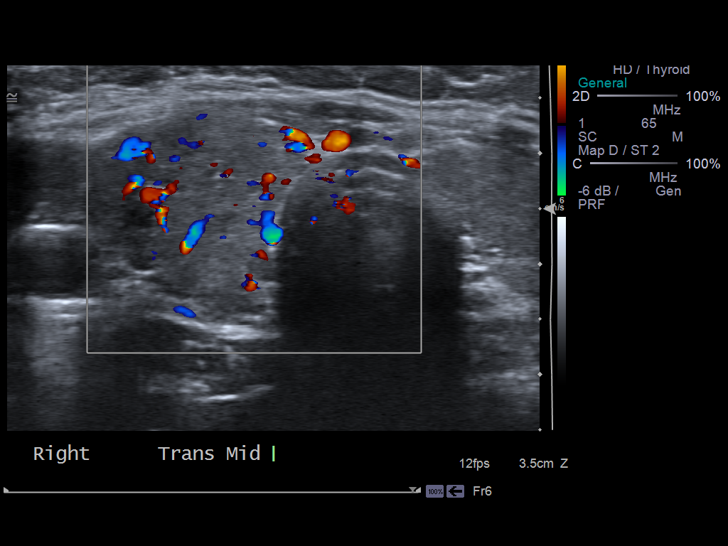
[im 9/33]
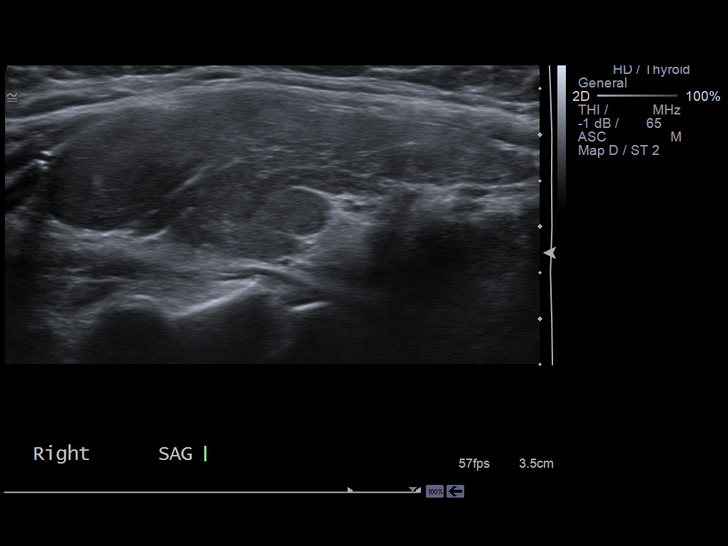
[im 11/33]
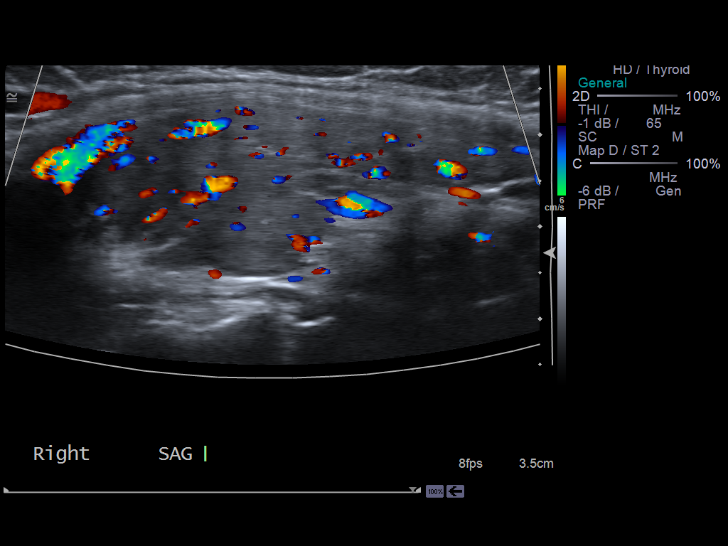
[im 13/33]
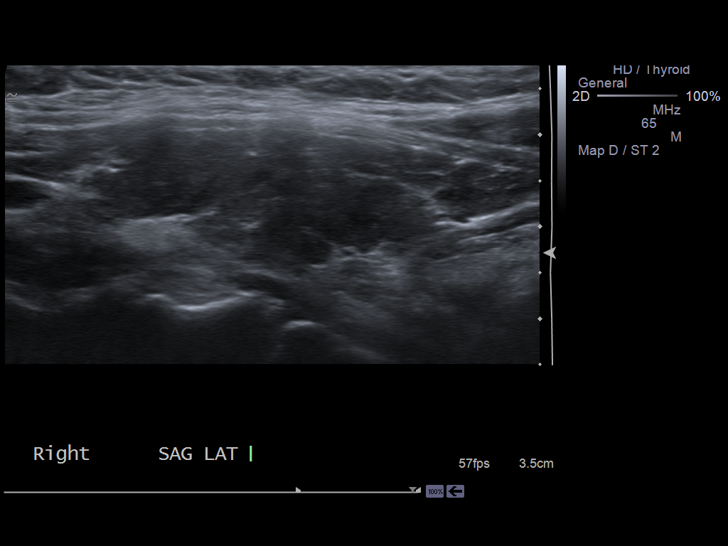
[im 15/33]
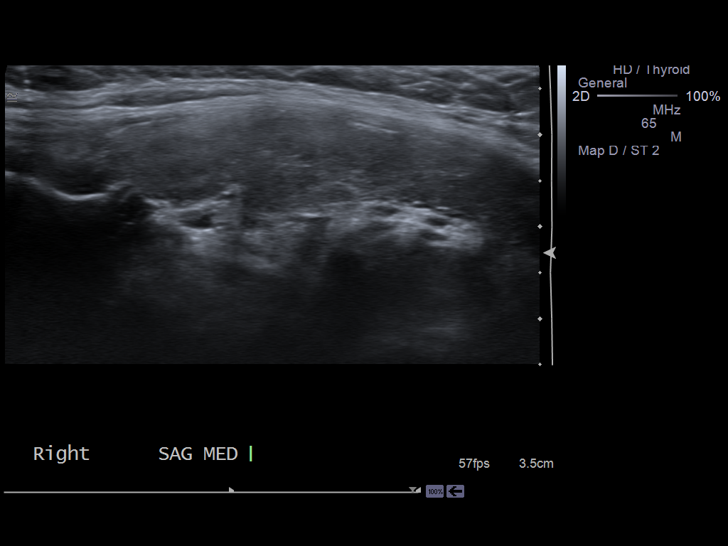
[im 18/33]
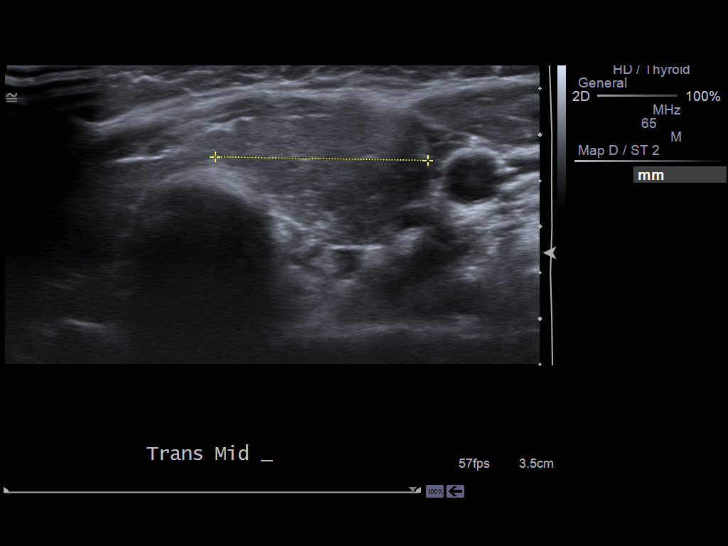
[im 21/33]
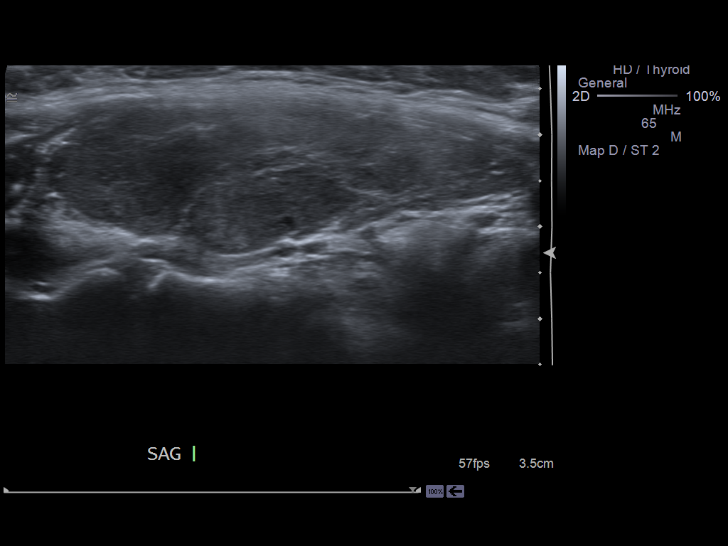
[im 22/33]
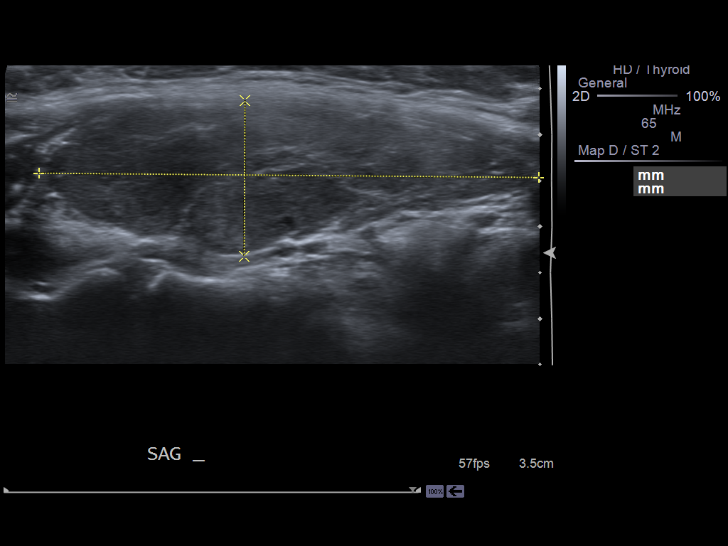
[im 25/33]
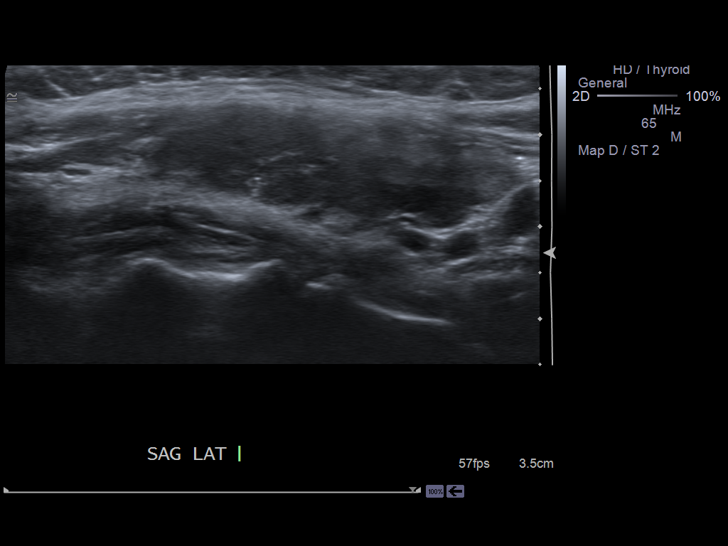
[im 27/33]
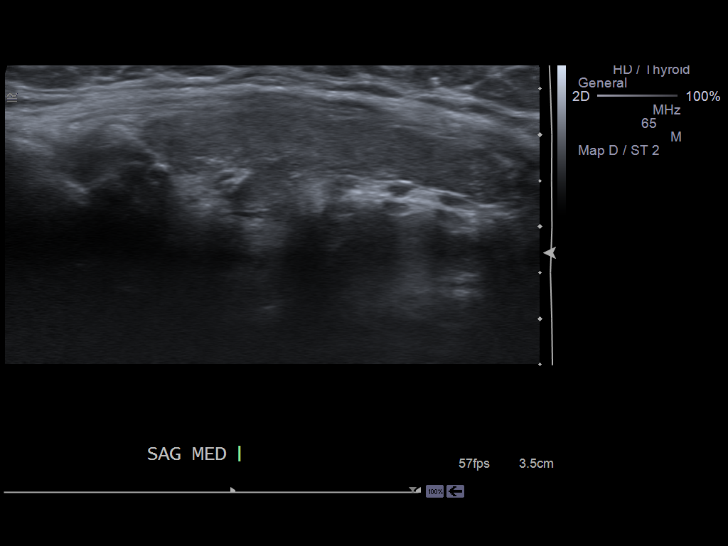
[im 30/33]
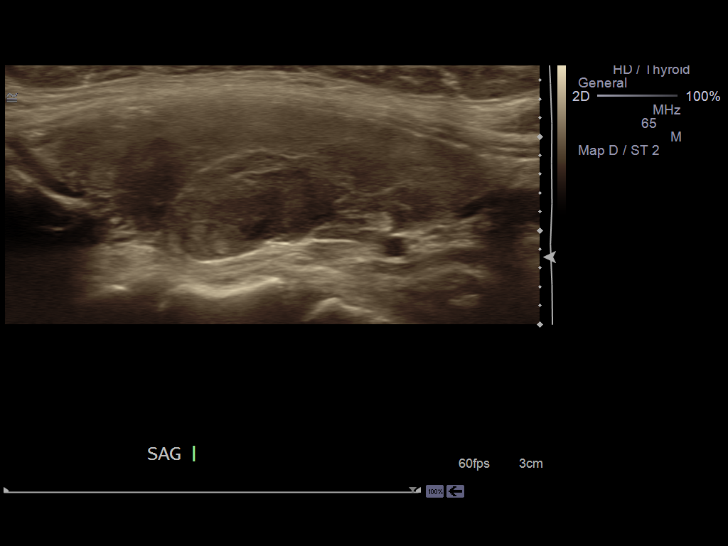
[im 33/33]
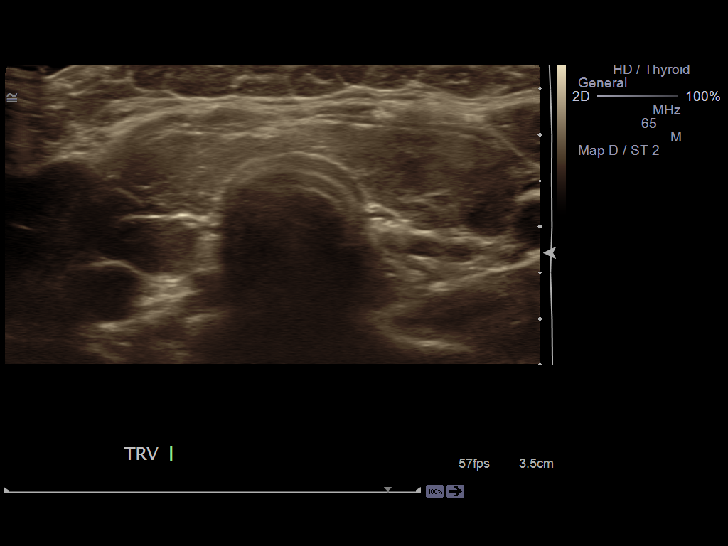

[14 of 25 positions shown; findings below may reference images not displayed]

FINDINGS: Right thyroid lobe:  5.4 x 1.9 x 2.1 cm.
Left thyroid lobe:  5.4 x 1.7 x 2.3 cm.
Isthmus:  0.3 cm.

Focal nodules:  None

Lymphadenopathy:  None visualized.
IMPRESSION: The thyroid gland is slightly prominent but there are no nodules or
other worrisome abnormalities.

## 2012-01-23 ENCOUNTER — Emergency Department (HOSPITAL_COMMUNITY)
Admission: EM | Admit: 2012-01-23 | Discharge: 2012-01-23 | Disposition: A | Discharge status: Home / Self Care | Payer: BC Managed Care – PPO

## 2012-02-02 ENCOUNTER — Encounter (HOSPITAL_COMMUNITY): Payer: Self-pay | Admitting: *Deleted

## 2012-02-02 ENCOUNTER — Emergency Department (HOSPITAL_COMMUNITY): Payer: Worker's Compensation

## 2012-02-02 ENCOUNTER — Emergency Department (HOSPITAL_COMMUNITY)
Admission: EM | Admit: 2012-02-02 | Discharge: 2012-02-02 | Disposition: A | Discharge status: Home / Self Care | Payer: Worker's Compensation | Attending: Emergency Medicine | Admitting: Emergency Medicine

## 2012-02-02 DIAGNOSIS — IMO0002 Reserved for concepts with insufficient information to code with codable children: Secondary | ICD-10-CM | POA: Insufficient documentation

## 2012-02-02 DIAGNOSIS — IMO0001 Reserved for inherently not codable concepts without codable children: Secondary | ICD-10-CM | POA: Insufficient documentation

## 2012-02-02 DIAGNOSIS — W19XXXA Unspecified fall, initial encounter: Secondary | ICD-10-CM | POA: Insufficient documentation

## 2012-02-02 DIAGNOSIS — T07XXXA Unspecified multiple injuries, initial encounter: Secondary | ICD-10-CM | POA: Insufficient documentation

## 2012-02-02 DIAGNOSIS — R51 Headache: Secondary | ICD-10-CM | POA: Insufficient documentation

## 2012-02-02 DIAGNOSIS — S86911A Strain of unspecified muscle(s) and tendon(s) at lower leg level, right leg, initial encounter: Secondary | ICD-10-CM

## 2012-02-02 MED ORDER — METAXALONE 800 MG PO TABS: 800.0000 mg | ORAL_TABLET | Freq: Three times a day (TID) | ORAL | Status: AC

## 2012-02-02 NOTE — ED Notes (Signed)
Fell at work getting off elevator, pain in right knee and right buttock, c/o headache but denies hitting head

## 2012-02-02 NOTE — ED Provider Notes (Signed)
History     CSN: [id number]  Arrival date & time 02/02/12  1200   None     Chief Complaint  Patient presents with  . Fall    (Consider location/radiation/quality/duration/timing/severity/associated sxs/prior treatment) HPI Comments: Pt states she slipped on a wet spot as she was stepping off an elevator today. No LOC reported. She reports injuring the right knee and right buttocks. The patient complains of a headache, but denies hitting her head. The patient further denies being on any blood thinning type medications at this time. She denies rib or chest area pain. No pelvis area pain reported. She has tried Tylenol but continues to have discomfort.  Patient is a 46 y.o. female presenting with fall. The history is provided by the patient.  Fall Associated symptoms include headaches. Pertinent negatives include no abdominal pain and no hematuria.    Past Medical History  Diagnosis Date  . Fibromyalgia     Past Surgical History  Procedure Date  . Tubal ligation   . Tonsillectomy     No family history on file.  History  Substance Use Topics  . Smoking status: Current Some Day Smoker  . Smokeless tobacco: Not on file  . Alcohol Use: No    OB History    Grav Para Term Preterm Abortions TAB SAB Ect Mult Living                  Review of Systems  Constitutional: Negative for activity change.       All ROS Neg except as noted in HPI  HENT: Negative for nosebleeds and neck pain.   Eyes: Negative for photophobia and discharge.  Respiratory: Negative for cough, shortness of breath and wheezing.   Cardiovascular: Negative for chest pain and palpitations.  Gastrointestinal: Negative for abdominal pain and blood in stool.  Genitourinary: Negative for dysuria, frequency and hematuria.  Musculoskeletal: Positive for back pain and arthralgias.  Skin: Negative.   Neurological: Positive for headaches. Negative for dizziness, seizures and speech difficulty.    Psychiatric/Behavioral: Negative for hallucinations and confusion.    Allergies  Review of patient's allergies indicates no known allergies.  Home Medications   Current Outpatient Rx  Name Route Sig Dispense Refill  . ACETAMINOPHEN 500 MG PO TABS Oral Take 1,000 mg by mouth every 6 (six) hours as needed. Pain     . AZITHROMYCIN 250 MG PO TABS Oral Take 250 mg by mouth daily. Take 2 tablets by mouth one first day then take 1 tablet by mouth for 4 days.     . ESCITALOPRAM OXALATE 20 MG PO TABS Oral Take 20 mg by mouth daily.      Marland Kitchen HYDROCODONE-HOMATROPINE 5-1.5 MG/5ML PO SYRP Oral Take 5 mLs by mouth every 6 (six) hours as needed. Congestion     . ALIVE WOMENS ENERGY PO TABS Oral Take 1 tablet by mouth daily.        BP 131/76  Pulse 79  Temp(Src) 98.3 F (36.8 C) (Oral)  Resp 18  Ht 5\' 3"  (1.6 m)  Wt 170 lb (77.111 kg)  BMI 30.11 kg/m2  SpO2 96%  Physical Exam  Nursing note and vitals reviewed. Constitutional: She is oriented to person, place, and time. She appears well-developed and well-nourished.  Non-toxic appearance.  HENT:  Head: Normocephalic and atraumatic.  Right Ear: Tympanic membrane and external ear normal.  Left Ear: Tympanic membrane and external ear normal.  Eyes: EOM and lids are normal. Pupils are equal, round, and reactive  to light.  Neck: Normal range of motion. Neck supple. Carotid bruit is not present.  Cardiovascular: Normal rate, regular rhythm, normal heart sounds, intact distal pulses and normal pulses.   Pulmonary/Chest: Breath sounds normal. No respiratory distress.  Abdominal: Soft. Bowel sounds are normal. There is no tenderness. There is no guarding.  Genitourinary:       Right buttox an upper thigh sore. No bruise noted. No hematoma appreciated. Chaperone in room during exam.  Musculoskeletal: Normal range of motion.       Pain with attempted range of motion of the right knee. No significant effusion appreciated. No evidence of deformity.  Distal pulses are symmetrical. Sensory is symmetrical. Achilles tendon is intact.  Lymphadenopathy:       Head (right side): No submandibular adenopathy present.       Head (left side): No submandibular adenopathy present.    She has no cervical adenopathy.  Neurological: She is alert and oriented to person, place, and time. She has normal strength. No cranial nerve deficit or sensory deficit.  Skin: Skin is warm and dry.  Psychiatric: She has a normal mood and affect. Her speech is normal.    ED Course  Procedures (including critical care time)  Labs Reviewed - No data to display Dg Sacrum/coccyx  02/02/2012  *RADIOLOGY REPORT*  Clinical Data: Fall.  SACRUM AND COCCYX - 2+ VIEW  Comparison: None  Findings: No acute bony abnormality.  No evidence of sacral or coccygeal fracture.  Visualized bony pelvis intact.  IMPRESSION: No fracture visualized.  Original Report Authenticated By: Cyndie Chime, M.D.   Dg Hip Complete Right  02/02/2012  *RADIOLOGY REPORT*  Clinical Data: Fall.  RIGHT HIP - COMPLETE 2+ VIEW  Comparison: 01/03/2009  Findings: Early degenerative changes in the hips bilaterally which are symmetric.  SI joints are symmetric and unremarkable.  No acute bony abnormality.  Specifically, no fracture, subluxation, or dislocation.  Soft tissues are intact.  IMPRESSION: Early degenerative changes in the hips. No acute bony abnormality.  Original Report Authenticated By: Cyndie Chime, M.D.   Dg Knee Complete 4 Views Right  02/02/2012  *RADIOLOGY REPORT*  Clinical Data:  right knee pain post fall  RIGHT KNEE - COMPLETE 4+ VIEW  Comparison: None.  Findings: Four views of the right knee submitted.  No acute fracture or subluxation.  No joint effusion.  Mild spurring of patella.  IMPRESSION: No acute fracture or subluxation.  Mild spurring of the patella.  Original Report Authenticated By: Natasha Mead, M.D.     No diagnosis found.    MDM  I have reviewed nursing notes, vital signs, and  all appropriate lab and imaging results for this patient.  X-ray of the coccyx area is negative for fracture. X-ray of the right hip and pelvis is negative for fracture. X-ray of the right knee is negative for fracture or dislocation. There is some degenerative spurring appreciated. No gross neurologic deficits appreciated. Patient is given a prescription for Skelaxin 800 mg, the patient is to continue her current medications. Patient is to see her primary care physician for followup and recheck.     Kathie Dike, [place] 02/10/12 [phone]

## 2012-02-02 NOTE — Discharge Instructions (Signed)
Your xrays are negative for fracture or dislocation. Please use the knee immobilizer for the next 5 to 7 days. (do not sleep in the immobilizer). Continue your current medications. Add Skelaxin to relax the muscles.

## 2012-02-02 NOTE — ED Notes (Signed)
Pt DC to home with steady gait and knee immobilizer on.

## 2012-02-18 NOTE — ED Provider Notes (Signed)
Evaluation and management procedures were performed by the PA/NP/resident physician under my supervision/collaboration.   Orbin Mayeux D Merton Wadlow, MD 02/18/12 2045 

## 2012-03-28 ENCOUNTER — Emergency Department (HOSPITAL_COMMUNITY)
Admission: EM | Admit: 2012-03-28 | Discharge: 2012-03-28 | Disposition: A | Discharge status: Home / Self Care | Payer: BC Managed Care – PPO | Attending: Emergency Medicine | Admitting: Emergency Medicine

## 2012-03-28 ENCOUNTER — Other Ambulatory Visit: Payer: Self-pay

## 2012-03-28 ENCOUNTER — Encounter (HOSPITAL_COMMUNITY): Payer: Self-pay

## 2012-03-28 DIAGNOSIS — F17200 Nicotine dependence, unspecified, uncomplicated: Secondary | ICD-10-CM | POA: Insufficient documentation

## 2012-03-28 DIAGNOSIS — E079 Disorder of thyroid, unspecified: Secondary | ICD-10-CM | POA: Insufficient documentation

## 2012-03-28 DIAGNOSIS — R51 Headache: Secondary | ICD-10-CM

## 2012-03-28 DIAGNOSIS — Z79899 Other long term (current) drug therapy: Secondary | ICD-10-CM | POA: Insufficient documentation

## 2012-03-28 DIAGNOSIS — R5381 Other malaise: Secondary | ICD-10-CM | POA: Insufficient documentation

## 2012-03-28 DIAGNOSIS — G43909 Migraine, unspecified, not intractable, without status migrainosus: Secondary | ICD-10-CM | POA: Insufficient documentation

## 2012-03-28 DIAGNOSIS — IMO0001 Reserved for inherently not codable concepts without codable children: Secondary | ICD-10-CM | POA: Insufficient documentation

## 2012-03-28 DIAGNOSIS — R55 Syncope and collapse: Secondary | ICD-10-CM

## 2012-03-28 DIAGNOSIS — R112 Nausea with vomiting, unspecified: Secondary | ICD-10-CM | POA: Insufficient documentation

## 2012-03-28 HISTORY — DX: Disorder of thyroid, unspecified: E07.9

## 2012-03-28 HISTORY — DX: Migraine, unspecified, not intractable, without status migrainosus: G43.909

## 2012-03-28 MED ORDER — METOCLOPRAMIDE HCL 5 MG/ML IJ SOLN
10.0000 mg | Freq: Once | INTRAMUSCULAR | Status: AC
  Administered 2012-03-28: 10 mg via INTRAVENOUS
  Filled 2012-03-28: qty 2

## 2012-03-28 MED ORDER — DIPHENHYDRAMINE HCL 50 MG/ML IJ SOLN
50.0000 mg | Freq: Once | INTRAMUSCULAR | Status: AC
  Administered 2012-03-28: 50 mg via INTRAVENOUS
  Filled 2012-03-28: qty 1

## 2012-03-28 MED ORDER — SODIUM CHLORIDE 0.9 % IV BOLUS (SEPSIS)
500.0000 mL | Freq: Once | INTRAVENOUS | Status: AC
  Administered 2012-03-28: 500 mL via INTRAVENOUS

## 2012-03-28 NOTE — ED Notes (Signed)
C/o frontal HA that started this morning; reports this pain feels similar to her migraine HA pain; states took Ultram, Topamax w/o relief.

## 2012-03-28 NOTE — ED Notes (Signed)
Pt crying, tearful, requesting something for pain. Will notify MD.

## 2012-03-28 NOTE — ED Notes (Signed)
C/o "heart fluttering" and requesting to wear oxygen; placed on O2 2L/min per [place].  Pt radial pulse +3 and regular.

## 2012-03-28 NOTE — ED Notes (Signed)
Pt is very upset that she was in the hallway. Pt states she needs something for nausea. Pt is also upset that nobody has seen her. She was moved to room 4 and the pt advocate with talking with her now.

## 2012-03-28 NOTE — ED Provider Notes (Signed)
History  This chart was scribed for Mary Gaskins, MD by Stevphen Meuse. This patient was seen in room APA04/AP[facility]04 and the patient's care was started at 3:19PM.  CSN: [id number]  Arrival date & time 03/28/12  1413   First MD Initiated Contact with Patient 03/28/12 1501      Chief Complaint  Patient presents with  . Migraine    Patient is a 46 y.o. female presenting with migraine. The history is provided by the patient and the spouse. No language interpreter was used.  Migraine Associated symptoms include headaches. Pertinent negatives include no chest pain and no shortness of breath.   Mary [name] is a 46 y.o. female brought in by ambulance, who presents to the Emergency Department complaining of 5 hours of gradual onset, gradually worsening severe frontal HA.  Pt reports that the pain feels similar to her migraine HAs. Pt states that she was has been crying all morning. Pt husband thinks her symptoms are brought on by stress and states that she has been having problems with her daughter.Pt states that her heart has been fluttering all day and was given O2 with relief.  Pt reports taking Ultram and Topamax with no relief.  Pt denies fever, neck pain, eye pain, SOB, chest pain, dysuria, back pain, adenopathy, wound and agitation as associated symptoms. Pt reports vomiting and weakness as associated symptoms. Pt has a h/o fibromyalgia, thyroid disease and migraine. Pt is a current some day smoker but denies a h/o alcohol use.   Past Medical History  Diagnosis Date  . Fibromyalgia   . Migraine   . Thyroid disease     Past Surgical History  Procedure Date  . Tubal ligation   . Tonsillectomy     No family history on file.  History  Substance Use Topics  . Smoking status: Current Some Day Smoker    Types: Cigarettes  . Smokeless tobacco: Not on file  . Alcohol Use: No    OB History    Grav Para Term Preterm Abortions TAB SAB Ect Mult Living                   Review of Systems  Constitutional: Negative for fever.  HENT: Negative for neck pain.   Eyes: Negative for pain.  Respiratory: Negative for shortness of breath.   Cardiovascular: Negative for chest pain.  Gastrointestinal: Positive for vomiting.  Genitourinary: Negative for dysuria.  Musculoskeletal: Negative for back pain.  Skin: Negative for wound.  Neurological: Positive for weakness and headaches.  Hematological: Negative for adenopathy.  Psychiatric/Behavioral: Negative for agitation.  All other systems reviewed and are negative.    Allergies  Review of patient's allergies indicates no known allergies.  Home Medications   Current Outpatient Rx  Name Route Sig Dispense Refill  . DULOXETINE HCL 60 MG PO CPEP Oral Take 60 mg by mouth daily.    Marland Kitchen METHOCARBAMOL 500 MG PO TABS Oral Take 500 mg by mouth 3 (three) times daily.    Marland Kitchen ALIVE WOMENS ENERGY PO TABS Oral Take 1 tablet by mouth daily.      . TOPIRAMATE 25 MG PO TABS Oral Take 50 mg by mouth at bedtime.    . TRAMADOL HCL 50 MG PO TABS Oral Take 50 mg by mouth 2 (two) times daily.    . AZITHROMYCIN 250 MG PO TABS Oral Take 250 mg by mouth daily. Take 2 tablets by mouth one first day then take 1 tablet by mouth for  4 days.     Marland Kitchen HYDROCODONE-HOMATROPINE 5-1.5 MG/5ML PO SYRP Oral Take 5 mLs by mouth every 6 (six) hours as needed. Congestion       BP 121/72  Pulse 68  Temp(Src) 97.7 F (36.5 C) (Oral)  Resp 20  Ht 5\' 3"  (1.6 m)  Wt 160 lb (72.576 kg)  BMI 28.34 kg/m2  SpO2 100%  Physical Exam  Nursing note and vitals reviewed.  CONSTITUTIONAL: Well developed/well nourished HEAD AND FACE: Normocephalic/atraumatic EYES: EOMI/PERRL ENMT: Mucous membranes moist NECK: supple no meningeal signs, No burits  SPINE:entire spine nontender CV: S1/S2 noted, no murmurs/rubs/gallops noted LUNGS: Lungs are clear to auscultation bilaterally, no apparent distress ABDOMEN: soft, nontender, no rebound or guarding GU:no cva  tenderness NEURO: Awake/alert, facies symmetric, no arm or leg drift is noted Cranial nerves 3/4/5/6/05/01/09/11/12 tested and intact Gait normal No past pointing  EXTREMITIES: pulses normal, full ROM SKIN: warm, color normal PSYCH: no abnormalities of mood noted   ED Course  Procedures   DIAGNOSTIC STUDIES: Oxygen Saturation is 100% on room air,  normal by my interpretation.    COORDINATION OF CARE:  3:25PM Discussed administering medication for her HA and ordering an EKG with pt and pt agreed  Pt improved, no neuro deficits, EKG unremarkable, stable for d/c  The patient appears reasonably screened and/or stabilized for discharge and I doubt any other medical condition or other [facility] requiring further screening, evaluation, or treatment in the ED at this time prior to discharge.    MDM  Nursing notes reviewed and considered in documentation    Date: 03/28/2012  Rate: 69  Rhythm: normal sinus rhythm  QRS Axis: normal  Intervals: normal  ST/T Wave abnormalities: nonspecific ST changes  Conduction Disutrbances:none  Narrative Interpretation:   Old EKG Reviewed: unchanged      I personally performed the services described in this documentation, which was scribed in my presence. The recorded information has been reviewed and considered.      Mary Gaskins, MD 03/28/12 [phone]

## 2012-03-28 NOTE — ED Notes (Signed)
Migraine headache for 5 hours pta--arrived byems

## 2012-03-28 NOTE — Discharge Instructions (Signed)

## 2012-05-24 ENCOUNTER — Emergency Department (HOSPITAL_COMMUNITY)
Admission: EM | Admit: 2012-05-24 | Discharge: 2012-05-24 | Disposition: A | Discharge status: Home / Self Care | Payer: BC Managed Care – PPO | Attending: Emergency Medicine | Admitting: Emergency Medicine

## 2012-05-24 ENCOUNTER — Encounter (HOSPITAL_COMMUNITY): Payer: Self-pay | Admitting: *Deleted

## 2012-05-24 DIAGNOSIS — R51 Headache: Secondary | ICD-10-CM

## 2012-05-24 DIAGNOSIS — F17200 Nicotine dependence, unspecified, uncomplicated: Secondary | ICD-10-CM | POA: Insufficient documentation

## 2012-05-24 DIAGNOSIS — IMO0001 Reserved for inherently not codable concepts without codable children: Secondary | ICD-10-CM | POA: Insufficient documentation

## 2012-05-24 DIAGNOSIS — E079 Disorder of thyroid, unspecified: Secondary | ICD-10-CM | POA: Insufficient documentation

## 2012-05-24 MED ORDER — KETOROLAC TROMETHAMINE 30 MG/ML IJ SOLN
30.0000 mg | Freq: Once | INTRAMUSCULAR | Status: AC
  Administered 2012-05-24: 30 mg via INTRAVENOUS
  Filled 2012-05-24: qty 1

## 2012-05-24 MED ORDER — METOCLOPRAMIDE HCL 5 MG/ML IJ SOLN
10.0000 mg | Freq: Once | INTRAMUSCULAR | Status: AC
  Administered 2012-05-24: 10 mg via INTRAVENOUS
  Filled 2012-05-24: qty 2

## 2012-05-24 MED ORDER — DIPHENHYDRAMINE HCL 50 MG/ML IJ SOLN
25.0000 mg | Freq: Once | INTRAMUSCULAR | Status: AC
  Administered 2012-05-24: 25 mg via INTRAVENOUS
  Filled 2012-05-24: qty 1

## 2012-05-24 MED ORDER — SODIUM CHLORIDE 0.9 % IV BOLUS (SEPSIS)
1000.0000 mL | Freq: Once | INTRAVENOUS | Status: AC
  Administered 2012-05-24: 1000 mL via INTRAVENOUS

## 2012-05-24 NOTE — ED Provider Notes (Cosign Needed)
History     CSN: [id number]  Arrival date & time 05/24/12  2017   First MD Initiated Contact with Patient 05/24/12 2044      Chief Complaint  Patient presents with  . Headache    (Consider location/radiation/quality/duration/timing/severity/associated sxs/prior treatment) Patient is a 46 y.o. female presenting with headaches. The history is provided by the patient (pt complains of a headache). No language interpreter was used.  Headache  This is a recurrent problem. The current episode started 6 to 12 hours ago. The problem occurs constantly. The problem has not changed since onset.The headache is associated with nothing. The pain is located in the left unilateral region. The quality of the pain is described as dull. The pain is at a severity of 6/10. The pain is moderate. The pain does not radiate. Associated symptoms include anorexia.    Past Medical History  Diagnosis Date  . Fibromyalgia   . Migraine   . Thyroid disease     Past Surgical History  Procedure Date  . Tubal ligation   . Tonsillectomy     History reviewed. No pertinent family history.  History  Substance Use Topics  . Smoking status: Current Some Day Smoker    Types: Cigarettes  . Smokeless tobacco: Not on file  . Alcohol Use: No    OB History    Grav Para Term Preterm Abortions TAB SAB Ect Mult Living                  Review of Systems  Constitutional: Negative for fatigue.  HENT: Negative for congestion, sinus pressure and ear discharge.   Eyes: Negative for discharge.  Respiratory: Negative for cough.   Cardiovascular: Negative for chest pain.  Gastrointestinal: Positive for anorexia. Negative for abdominal pain and diarrhea.  Genitourinary: Negative for frequency and hematuria.  Musculoskeletal: Negative for back pain.  Skin: Negative for rash.  Neurological: Positive for headaches. Negative for seizures.  Hematological: Negative.   Psychiatric/Behavioral: Negative for hallucinations.     Allergies  Review of patient's allergies indicates no known allergies.  Home Medications   Current Outpatient Rx  Name Route Sig Dispense Refill  . DULOXETINE HCL 60 MG PO CPEP Oral Take 60 mg by mouth daily.    Marland Kitchen FLAXSEED OIL PO Oral Take 1 tablet by mouth daily.    Marland Kitchen CALCIUM-MAGNESIUM-ZINC-D3 PO Oral Take 1 tablet by mouth daily.    Marland Kitchen ALIVE WOMENS ENERGY PO TABS Oral Take 1 tablet by mouth daily.      . TOPIRAMATE 25 MG PO TABS Oral Take 50 mg by mouth at bedtime.    . TRAMADOL HCL 50 MG PO TABS Oral Take 50 mg by mouth 2 (two) times daily.    Marland Kitchen METHOCARBAMOL 500 MG PO TABS Oral Take 500 mg by mouth 3 (three) times daily.      BP 130/84  Pulse 76  Temp 98 F (36.7 C) (Oral)  Resp 22  Ht 5\' 3"  (1.6 m)  Wt 160 lb (72.576 kg)  BMI 28.34 kg/m2  SpO2 100%  Physical Exam  Constitutional: She is oriented to person, place, and time. She appears well-developed.  HENT:  Head: Normocephalic and atraumatic.  Eyes: Conjunctivae and EOM are normal. No scleral icterus.  Neck: Neck supple. No thyromegaly present.  Cardiovascular: Normal rate and regular rhythm.  Exam reveals no gallop and no friction rub.   No murmur heard. Pulmonary/Chest: No stridor. She has no wheezes. She has no rales. She exhibits no tenderness.  Abdominal: She exhibits no distension. There is no tenderness. There is no rebound.  Musculoskeletal: Normal range of motion. She exhibits no edema.  Lymphadenopathy:    She has no cervical adenopathy.  Neurological: She is oriented to person, place, and time. Coordination normal.  Skin: No rash noted. No erythema.  Psychiatric: She has a normal mood and affect. Her behavior is normal.    ED Course  Procedures (including critical care time)  Labs Reviewed - No data to display No results found.   1. Headache       MDM          Benny Lennert, MD 05/24/12 2136

## 2012-05-24 NOTE — ED Notes (Signed)
Pt alert & oriented x4. Patient given discharge instructions, paperwork. Patient instructed to stop at the registration desk to finish any additional paperwork. Patient verbalized understanding. Pt left department w/ no further questions.  
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
## 2012-08-17 ENCOUNTER — Other Ambulatory Visit: Payer: Self-pay | Admitting: Family Medicine

## 2012-08-17 DIAGNOSIS — Z139 Encounter for screening, unspecified: Secondary | ICD-10-CM

## 2012-08-24 ENCOUNTER — Ambulatory Visit (HOSPITAL_COMMUNITY)
Admission: RE | Admit: 2012-08-24 | Discharge: 2012-08-24 | Disposition: A | Discharge status: Home / Self Care | Payer: BC Managed Care – PPO | Source: Ambulatory Visit | Attending: Family Medicine | Admitting: Family Medicine

## 2012-08-24 DIAGNOSIS — Z139 Encounter for screening, unspecified: Secondary | ICD-10-CM

## 2012-08-24 DIAGNOSIS — Z1231 Encounter for screening mammogram for malignant neoplasm of breast: Secondary | ICD-10-CM | POA: Insufficient documentation

## 2013-02-18 ENCOUNTER — Emergency Department (HOSPITAL_COMMUNITY)
Admission: EM | Admit: 2013-02-18 | Discharge: 2013-02-18 | Disposition: A | Discharge status: Home / Self Care | Payer: Self-pay | Attending: Emergency Medicine | Admitting: Emergency Medicine

## 2013-02-18 ENCOUNTER — Encounter (HOSPITAL_COMMUNITY): Payer: Self-pay | Admitting: *Deleted

## 2013-02-18 DIAGNOSIS — F430 Acute stress reaction: Secondary | ICD-10-CM | POA: Insufficient documentation

## 2013-02-18 DIAGNOSIS — Z79899 Other long term (current) drug therapy: Secondary | ICD-10-CM | POA: Insufficient documentation

## 2013-02-18 DIAGNOSIS — Z8679 Personal history of other diseases of the circulatory system: Secondary | ICD-10-CM | POA: Insufficient documentation

## 2013-02-18 DIAGNOSIS — F419 Anxiety disorder, unspecified: Secondary | ICD-10-CM

## 2013-02-18 DIAGNOSIS — IMO0001 Reserved for inherently not codable concepts without codable children: Secondary | ICD-10-CM | POA: Insufficient documentation

## 2013-02-18 DIAGNOSIS — E079 Disorder of thyroid, unspecified: Secondary | ICD-10-CM | POA: Insufficient documentation

## 2013-02-18 DIAGNOSIS — M542 Cervicalgia: Secondary | ICD-10-CM | POA: Insufficient documentation

## 2013-02-18 DIAGNOSIS — F411 Generalized anxiety disorder: Secondary | ICD-10-CM | POA: Insufficient documentation

## 2013-02-18 DIAGNOSIS — F17200 Nicotine dependence, unspecified, uncomplicated: Secondary | ICD-10-CM | POA: Insufficient documentation

## 2013-02-18 DIAGNOSIS — G8929 Other chronic pain: Secondary | ICD-10-CM | POA: Insufficient documentation

## 2013-02-18 DIAGNOSIS — Z3202 Encounter for pregnancy test, result negative: Secondary | ICD-10-CM | POA: Insufficient documentation

## 2013-02-18 DIAGNOSIS — R002 Palpitations: Secondary | ICD-10-CM | POA: Insufficient documentation

## 2013-02-18 LAB — CBC WITH DIFFERENTIAL/PLATELET
Basophils Absolute: 0 10*3/uL (ref 0.0–0.1)
Basophils Relative: 0 % (ref 0–1)
Eosinophils Absolute: 0.2 10*3/uL (ref 0.0–0.7)
Eosinophils Relative: 2 % (ref 0–5)
HCT: 41.5 % (ref 36.0–46.0)
Hemoglobin: 14.3 g/dL (ref 12.0–15.0)
Lymphocytes Relative: 47 % — ABNORMAL HIGH (ref 12–46)
Lymphs Abs: 4.2 10*3/uL — ABNORMAL HIGH (ref 0.7–4.0)
MCH: 31.5 pg (ref 26.0–34.0)
MCHC: 34.5 g/dL (ref 30.0–36.0)
MCV: 91.4 fL (ref 78.0–100.0)
Monocytes Absolute: 0.5 10*3/uL (ref 0.1–1.0)
Monocytes Relative: 5 % (ref 3–12)
Neutro Abs: 4.1 10*3/uL (ref 1.7–7.7)
Neutrophils Relative %: 46 % (ref 43–77)
Platelets: 331 10*3/uL (ref 150–400)
RBC: 4.54 MIL/uL (ref 3.87–5.11)
RDW: 12.1 % (ref 11.5–15.5)
WBC: 8.9 10*3/uL (ref 4.0–10.5)

## 2013-02-18 LAB — BASIC METABOLIC PANEL
BUN: 10 mg/dL (ref 6–23)
CO2: 29 mEq/L (ref 19–32)
Calcium: 9.4 mg/dL (ref 8.4–10.5)
Chloride: 100 mEq/L (ref 96–112)
Creatinine, Ser: 0.66 mg/dL (ref 0.50–1.10)
GFR calc Af Amer: >90 mL/min (ref >90)
GFR calc non Af Amer: >90 mL/min (ref >90)
Glucose, Bld: 86 mg/dL (ref 70–99)
Potassium: 3.9 mEq/L (ref 3.5–5.1)
Sodium: 139 mEq/L (ref 135–145)

## 2013-02-18 LAB — TROPONIN I: Troponin I: <0.3 ng/mL (ref <0.30)

## 2013-02-18 LAB — PREGNANCY, URINE: Preg Test, Ur: NEGATIVE

## 2013-02-18 MED ORDER — LORAZEPAM 1 MG PO TABS: 1.0000 mg | ORAL_TABLET | Freq: Three times a day (TID) | ORAL | Status: DC | PRN

## 2013-02-18 MED ORDER — LORAZEPAM 1 MG PO TABS
2.0000 mg | ORAL_TABLET | Freq: Once | ORAL | Status: AC
  Administered 2013-02-18: 2 mg via ORAL
  Filled 2013-02-18: qty 2
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
## 2013-02-18 NOTE — ED Notes (Signed)
Irregular heart beat, feels it "fluttering". "Under a lot of stress"  Sob at times.   Dizzy at times

## 2013-02-19 LAB — TSH: TSH: 0.964 u[IU]/mL (ref 0.350–4.500)

## 2013-06-22 ENCOUNTER — Emergency Department (HOSPITAL_COMMUNITY)
Admission: EM | Admit: 2013-06-22 | Discharge: 2013-06-22 | Disposition: A | Discharge status: Home / Self Care | Payer: Self-pay | Attending: Emergency Medicine | Admitting: Emergency Medicine

## 2013-06-22 ENCOUNTER — Emergency Department (HOSPITAL_COMMUNITY): Payer: Self-pay

## 2013-06-22 ENCOUNTER — Encounter (HOSPITAL_COMMUNITY): Payer: Self-pay | Admitting: Emergency Medicine

## 2013-06-22 DIAGNOSIS — Z79899 Other long term (current) drug therapy: Secondary | ICD-10-CM | POA: Insufficient documentation

## 2013-06-22 DIAGNOSIS — Z8679 Personal history of other diseases of the circulatory system: Secondary | ICD-10-CM | POA: Insufficient documentation

## 2013-06-22 DIAGNOSIS — E079 Disorder of thyroid, unspecified: Secondary | ICD-10-CM | POA: Insufficient documentation

## 2013-06-22 DIAGNOSIS — M545 Low back pain, unspecified: Secondary | ICD-10-CM | POA: Insufficient documentation

## 2013-06-22 DIAGNOSIS — F17200 Nicotine dependence, unspecified, uncomplicated: Secondary | ICD-10-CM | POA: Insufficient documentation

## 2013-06-22 DIAGNOSIS — R109 Unspecified abdominal pain: Secondary | ICD-10-CM | POA: Insufficient documentation

## 2013-06-22 DIAGNOSIS — IMO0001 Reserved for inherently not codable concepts without codable children: Secondary | ICD-10-CM | POA: Insufficient documentation

## 2013-06-22 LAB — URINE MICROSCOPIC-ADD ON

## 2013-06-22 LAB — URINALYSIS, ROUTINE W REFLEX MICROSCOPIC
Bilirubin Urine: NEGATIVE
Glucose, UA: NEGATIVE mg/dL
Ketones, ur: NEGATIVE mg/dL
Nitrite: NEGATIVE
Protein, ur: NEGATIVE mg/dL
Specific Gravity, Urine: 1.025 (ref 1.005–1.030)
Urobilinogen, UA: 0.2 mg/dL (ref 0.0–1.0)
pH: 6 (ref 5.0–8.0)

## 2013-06-22 MED ORDER — CYCLOBENZAPRINE HCL 10 MG PO TABS: 10.0000 mg | ORAL_TABLET | Freq: Two times a day (BID) | ORAL | Status: DC | PRN

## 2013-06-22 MED ORDER — IBUPROFEN 800 MG PO TABS
800.0000 mg | ORAL_TABLET | Freq: Once | ORAL | Status: AC
  Administered 2013-06-22: 800 mg via ORAL
  Filled 2013-06-22: qty 1

## 2013-06-22 MED ORDER — HYDROMORPHONE HCL PF 1 MG/ML IJ SOLN
1.0000 mg | Freq: Once | INTRAMUSCULAR | Status: AC
  Administered 2013-06-22: 1 mg via INTRAMUSCULAR
  Filled 2013-06-22: qty 1

## 2013-06-22 MED ORDER — TRAMADOL HCL 50 MG PO TABS: 50.0000 mg | ORAL_TABLET | Freq: Four times a day (QID) | ORAL | Status: DC | PRN

## 2013-06-22 MED ORDER — IBUPROFEN 800 MG PO TABS: 800.0000 mg | ORAL_TABLET | Freq: Three times a day (TID) | ORAL | Status: DC

## 2013-06-22 NOTE — ED Notes (Signed)
Pt c/o left sided back pain x one week.

## 2013-06-22 NOTE — ED Provider Notes (Signed)
CSN: [id number]     Arrival date & time 06/22/13  0125 History   First MD Initiated Contact with Patient 06/22/13 0157     Chief Complaint  Patient presents with  . Back Pain   (Consider location/radiation/quality/duration/timing/severity/associated sxs/prior Treatment) HPI History provided by patient. Left-sided low back and flank pain, worse with movement and radiates to left anterior thigh. Pain is sharp in quality and moderate in severity. Started about a week ago without any known trauma. No history of same. No weakness or numbness. No blood in urine. No incontinence of bowel or bladder. Past Medical History  Diagnosis Date  . Fibromyalgia   . Migraine   . Thyroid disease    Past Surgical History  Procedure Laterality Date  . Tubal ligation    . Tonsillectomy    . Uterine ablation     History reviewed. No pertinent family history. History  Substance Use Topics  . Smoking status: Current Some Day Smoker    Types: Cigarettes  . Smokeless tobacco: Not on file  . Alcohol Use: No   OB History   Grav Para Term Preterm Abortions TAB SAB Ect Mult Living                 Review of Systems  Constitutional: Negative for fever and chills.  HENT: Negative for neck pain and neck stiffness.   Respiratory: Negative for shortness of breath.   Cardiovascular: Negative for chest pain.  Gastrointestinal: Negative for abdominal pain.  Genitourinary: Negative for dysuria and hematuria.  Musculoskeletal: Positive for back pain.  Skin: Negative for rash.  Neurological: Negative for headaches.  All other systems reviewed and are negative.    Allergies  Review of patient's allergies indicates no known allergies.  Home Medications   Current Outpatient Rx  Name  Route  Sig  Dispense  Refill  . DULoxetine (CYMBALTA) 60 MG capsule   Oral   Take 60 mg by mouth daily.         Marland Kitchen levothyroxine (SYNTHROID, LEVOTHROID) 50 MCG tablet   Oral   Take 50 mcg by mouth daily before  breakfast.         . Multiple Vitamins-Minerals (ALIVE WOMENS ENERGY) TABS   Oral   Take 1 tablet by mouth daily.           . pseudoephedrine-guaifenesin (MUCINEX D) 60-600 MG per tablet   Oral   Take 1 tablet by mouth every 12 (twelve) hours.         Marland Kitchen LORazepam (ATIVAN) 1 MG tablet   Oral   Take 1 tablet (1 mg total) by mouth every 8 (eight) hours as needed for anxiety.   6 tablet   0    BP 134/84  Pulse 73  Temp(Src) 98.3 F (36.8 C) (Oral)  Resp 20  Ht 5\' 3"  (1.6 m)  Wt 160 lb (72.576 kg)  BMI 28.35 kg/m2  SpO2 100% Physical Exam  Constitutional: She is oriented to person, place, and time. She appears well-developed and well-nourished.  HENT:  Head: Normocephalic and atraumatic.  Eyes: EOM are normal. Pupils are equal, round, and reactive to light.  Neck: Neck supple.  Cardiovascular: Normal rate, regular rhythm and intact distal pulses.   Pulmonary/Chest: Effort normal and breath sounds normal. No respiratory distress.  Abdominal: Soft. Bowel sounds are normal. She exhibits no distension. There is no tenderness.  Musculoskeletal: Normal range of motion. She exhibits no edema.  Tender over left flank and left para lumbar region without  any midline tenderness or deformity. No lower extremity deficits with sensorium to light touch and motor intact. Normal gait.  Neurological: She is alert and oriented to person, place, and time.  Skin: Skin is warm and dry.    ED Course  Procedures (including critical care time) Labs Review Labs Reviewed  URINALYSIS, ROUTINE W REFLEX MICROSCOPIC - Abnormal; Notable for the following:    Hgb urine dipstick LARGE (*)    Leukocytes, UA TRACE (*)    All other components within normal limits  URINE MICROSCOPIC-ADD ON - Abnormal; Notable for the following:    Squamous Epithelial / LPF FEW (*)    Bacteria, UA FEW (*)    All other components within normal limits   Imaging Review Ct Abdomen Pelvis Wo Contrast  06/22/2013    *RADIOLOGY REPORT*  Clinical Data: Left-sided flank pain for 1 week.  CT ABDOMEN AND PELVIS WITHOUT CONTRAST  Technique:  Multidetector CT imaging of the abdomen and pelvis was performed following the standard protocol without intravenous contrast.  Comparison: 11/22/2007.  Findings:  BODY WALL: Unremarkable.  LOWER CHEST:  Mediastinum: Unremarkable.  Lungs/pleura: No consolidation.  ABDOMEN/PELVIS:  Liver: No focal abnormality.  Biliary: No evidence of biliary obstruction or stone.  Pancreas: Unremarkable.  Spleen: Unremarkable.  Adrenals: Unremarkable.  Kidneys and ureters: No hydronephrosis or stone.  Bladder: Unremarkable.  Bowel: No obstruction. Normal appendix.  Retroperitoneum: No mass or adenopathy.  Peritoneum: No free fluid or gas.  Reproductive: Unremarkable.  Vascular: No acute abnormality.  OSSEOUS: No acute abnormalities. L5-S1 central disc herniation, moderate in size, unchanged from 2009 comparison. Right ilium bone island.  IMPRESSION: Negative CT of the abdomen.   Original Report Authenticated By: Tiburcio Pea   Dg Lumbar Spine Complete  06/22/2013   *RADIOLOGY REPORT*  Clinical Data: Low back pain for 3 weeks.  Worsening tonight.  No injury.  LUMBAR SPINE - COMPLETE 4+ VIEW  Comparison: 03/25/2010.  Findings: No acute fracture or subluxation.  No focally advanced degenerative disc narrowing.  No progression of degenerative changes since 2011.  No spondylolysis.  Bone island again noted in the right ilium.  IMPRESSION: No acute or significant abnormality involving the lumbar spine.   Original Report Authenticated By: Tiburcio Pea   4:40 AM on recheck is still having back pain. She has not noticed any hematuria and has some red blood cells on urinalysis as above. Discussed with patient and she would like to have a CT scan.    Back pain precautions provided. Prescriptions provided. Plan close outpatient followup and return here for any fevers or worsening condition. No deficits or red  flags to suggest indication for emergent MRI MDM  Diagnosis low back pain, microscopic hematuria  X-ray and CT scan obtained and reviewed Improved with IM Dilaudid  Vital signs / nurse's notes reviewed and considered        Sunnie Nielsen, MD 06/22/13 [phone]

## 2013-09-27 ENCOUNTER — Encounter: Payer: Self-pay | Admitting: Family Medicine

## 2013-09-27 ENCOUNTER — Ambulatory Visit (INDEPENDENT_AMBULATORY_CARE_PROVIDER_SITE_OTHER): Payer: Self-pay | Admitting: Family Medicine

## 2013-09-27 ENCOUNTER — Telehealth: Payer: Self-pay | Admitting: *Deleted

## 2013-09-27 VITALS — BP 102/70 | Temp 97.9°F | Ht 63.0 in | Wt 166.5 lb

## 2013-09-27 DIAGNOSIS — J019 Acute sinusitis, unspecified: Secondary | ICD-10-CM

## 2013-09-27 NOTE — Progress Notes (Signed)
   Subjective:    Patient ID: Mary [name], female    DOB: 11-Nov-1965, 47 y.o.   MRN: [id number]  Sore Throat  This is a new problem. The current episode started in the past 7 days. The problem has been unchanged. Neither side of throat is experiencing more pain than the other. There has been no fever. The pain is at a severity of 7/10. The pain is moderate. Associated symptoms include congestion, coughing and ear pain. Treatments tried: nyquil. The treatment provided no relief.   PMH benign   Review of Systems  HENT: Positive for congestion and ear pain.   Respiratory: Positive for cough.        Objective:   Physical Exam Lungs are clear hearts regular pulse normal extremities no edema skin warm dry neurologic grossly normal  Mild sinus tenderness     Assessment & Plan:  Viral syndrome with secondary sinusitis antibiotics discussed. Followup if ongoing troubles. Warnings discussed.

## 2013-09-27 NOTE — Telephone Encounter (Signed)
Pt made an appt for today

## 2013-09-27 NOTE — Telephone Encounter (Signed)
Pt is concern she thinks she may have a strep throat or possible allergies please call pt at [phone]

## 2013-11-05 ENCOUNTER — Telehealth: Payer: Self-pay | Admitting: Family Medicine

## 2013-11-05 NOTE — Telephone Encounter (Signed)
Patient would like someone to call her back and answer her questions that she has about the constipation she is experiencing.

## 2013-11-05 NOTE — Telephone Encounter (Signed)
Patient was calling to ask medical questions concerning her grandchild that is not our patient. Advised patient that we can not give medical advice for someone that is not our patient.

## 2014-02-04 ENCOUNTER — Encounter: Payer: Self-pay | Admitting: Family Medicine

## 2014-02-04 ENCOUNTER — Ambulatory Visit (INDEPENDENT_AMBULATORY_CARE_PROVIDER_SITE_OTHER): Payer: Self-pay | Admitting: Family Medicine

## 2014-02-04 VITALS — BP 122/70 | Temp 99.7°F | Ht 63.0 in | Wt 164.0 lb

## 2014-02-04 DIAGNOSIS — J310 Chronic rhinitis: Secondary | ICD-10-CM

## 2014-02-04 DIAGNOSIS — J329 Chronic sinusitis, unspecified: Secondary | ICD-10-CM

## 2014-02-04 MED ORDER — AMOXICILLIN-POT CLAVULANATE 875-125 MG PO TABS: 1.0000 | ORAL_TABLET | Freq: Two times a day (BID) | ORAL | Status: AC

## 2014-02-04 NOTE — Progress Notes (Signed)
   Subjective:    Patient ID: Mary [name], female    DOB: [birth year], 48 y.o.   MRN: [id number]  Sinusitis This is a new problem. The current episode started in the past 7 days. Associated symptoms include coughing, ear pain and a hoarse voice. (Runny nose) Treatments tried: zyrtec.    Not much coughing  More aggravating, started about a week ago  Moistening meds for nasal passage  Nasal disch  Left sided, dizy at times  Dizzy worse today  Review of Systems  HENT: Positive for ear pain and hoarse voice.   Respiratory: Positive for cough.    no vomiting no diarrhea no rash     Objective:   Physical Exam Alert moderate malaise. Low-grade fever temp 99.7. Vitals reviewed. Frontal maxillary tenderness. Pharynx slight erythema neck supple. Lungs clear. Heart rare in rhythm.       Assessment & Plan:  Impression acute rhinosinusitis plan antibiotics prescribed. Symptomatic care discussed. Warning signs discussed. WSL

## 2014-04-22 ENCOUNTER — Encounter: Payer: Self-pay | Admitting: *Deleted

## 2014-05-27 ENCOUNTER — Ambulatory Visit (INDEPENDENT_AMBULATORY_CARE_PROVIDER_SITE_OTHER): Payer: Self-pay | Admitting: Family Medicine

## 2014-05-27 ENCOUNTER — Encounter: Payer: Self-pay | Admitting: Family Medicine

## 2014-05-27 VITALS — BP 110/72 | Temp 98.8°F | Ht 63.0 in | Wt 164.0 lb

## 2014-05-27 DIAGNOSIS — IMO0001 Reserved for inherently not codable concepts without codable children: Secondary | ICD-10-CM

## 2014-05-27 DIAGNOSIS — M797 Fibromyalgia: Secondary | ICD-10-CM

## 2014-05-27 MED ORDER — BUPROPION HCL ER (SR) 150 MG PO TB12: ORAL_TABLET | ORAL | Status: DC

## 2014-05-27 MED ORDER — HYDROCODONE-HOMATROPINE 5-1.5 MG/5ML PO SYRP: 5.0000 mL | ORAL_SOLUTION | Freq: Every evening | ORAL | Status: DC | PRN

## 2014-05-27 MED ORDER — AMOXICILLIN-POT CLAVULANATE 875-125 MG PO TABS: 1.0000 | ORAL_TABLET | Freq: Two times a day (BID) | ORAL | Status: AC

## 2014-05-27 NOTE — Progress Notes (Signed)
   Subjective:    Patient ID: Mary [name], female    DOB: 1966/07/04, 48 y.o.   MRN: [id number]  Cough This is a new problem. The current episode started in the past 7 days. Associated symptoms include headaches. Associated symptoms comments: Runny nose, sinus pressure. Treatments tried: zyrtec. The treatment provided no relief.   Would like RX to help quit smoking.   In the past has tried to quit.longest period of time off the cigaretes,   Back to smoking after family stress.  Smokes a pack per day, Husband smokes also   Some productive cough a little green   Left max tenderness and fullness Does not smoke in the office, outside  Patient notes she is treated for fibromyalgia by Dr. Wendy Poetouglas Warner.   Zyrtec prn   Review of Systems  Respiratory: Positive for cough.   Neurological: Positive for headaches.       Objective:   Physical Exam  Alert mild malaise. Hydration good. Vital stable. Frontal maxillary tenderness left greater than right pharynx normal neck supple. Lungs no crackles no wheezes intermittent bronchial cough      Assessment & Plan:  Impression rhinosinusitis/bronchitis patient would like to quit smoking discussed at length plan Wellbutrin appropriate dose. Antibiotics prescribed. Symptomatic care discussed. WSL

## 2014-05-28 ENCOUNTER — Encounter (HOSPITAL_COMMUNITY): Payer: Self-pay | Admitting: Emergency Medicine

## 2014-05-28 ENCOUNTER — Emergency Department (HOSPITAL_COMMUNITY)
Admission: EM | Admit: 2014-05-28 | Discharge: 2014-05-28 | Disposition: A | Discharge status: Home / Self Care | Payer: Self-pay | Attending: Emergency Medicine | Admitting: Emergency Medicine

## 2014-05-28 DIAGNOSIS — Z79899 Other long term (current) drug therapy: Secondary | ICD-10-CM | POA: Insufficient documentation

## 2014-05-28 DIAGNOSIS — J209 Acute bronchitis, unspecified: Secondary | ICD-10-CM | POA: Insufficient documentation

## 2014-05-28 DIAGNOSIS — R05 Cough: Secondary | ICD-10-CM | POA: Insufficient documentation

## 2014-05-28 DIAGNOSIS — M797 Fibromyalgia: Secondary | ICD-10-CM | POA: Insufficient documentation

## 2014-05-28 DIAGNOSIS — F17200 Nicotine dependence, unspecified, uncomplicated: Secondary | ICD-10-CM | POA: Insufficient documentation

## 2014-05-28 DIAGNOSIS — R059 Cough, unspecified: Secondary | ICD-10-CM | POA: Insufficient documentation

## 2014-05-28 DIAGNOSIS — E039 Hypothyroidism, unspecified: Secondary | ICD-10-CM | POA: Insufficient documentation

## 2014-05-28 DIAGNOSIS — G43909 Migraine, unspecified, not intractable, without status migrainosus: Secondary | ICD-10-CM | POA: Insufficient documentation

## 2014-05-28 MED ORDER — HYDROCOD POLST-CHLORPHEN POLST 10-8 MG/5ML PO LQCR: 5.0000 mL | Freq: Two times a day (BID) | ORAL | Status: DC | PRN

## 2014-05-28 MED ORDER — PREDNISONE 10 MG PO TABS: 20.0000 mg | ORAL_TABLET | Freq: Every day | ORAL | Status: DC

## 2014-05-28 MED ORDER — ALBUTEROL SULFATE HFA 108 (90 BASE) MCG/ACT IN AERS
2.0000 | INHALATION_SPRAY | RESPIRATORY_TRACT | Status: DC | PRN
  Administered 2014-05-28: 2 via RESPIRATORY_TRACT
  Filled 2014-05-28: qty 6.7

## 2014-05-28 MED ORDER — AEROCHAMBER PLUS FLO-VU MEDIUM MISC
1.0000 | Freq: Once | Status: AC
  Administered 2014-05-28: 1
  Filled 2014-05-28: qty 1

## 2014-05-28 MED ORDER — PREDNISONE 50 MG PO TABS
60.0000 mg | ORAL_TABLET | Freq: Once | ORAL | Status: AC
  Administered 2014-05-28: 60 mg via ORAL
  Filled 2014-05-28 (×2): qty 1

## 2014-05-28 NOTE — ED Provider Notes (Signed)
CSN: [id number]     Arrival date & time 05/28/14  1613 History   First MD Initiated Contact with Patient 05/28/14 1621     Chief Complaint  Patient presents with  . Bronchitis     (Consider location/radiation/quality/duration/timing/severity/associated sxs/prior Treatment) HPI 48 year old female presents today complaining of cough. She has had URI symptoms that began on Sunday. She has not had fever but has felt hot and cold. She has coughed up discolored sputum. She has been taking Tylenol for pain but has worsening pain in her bilateral lower rib cages with coughing. She was seen by Dr. Gerda Diss yesterday and started on Augmentin. She comes in today complaining of continued coughing. She has not been needing as well as usual duties general malaise. She has been taking fluids in. She has had not had nausea or vomiting. She has taken Augmentin last night and this morning. She is a smoker but has not been smoking the past several days. Past Medical History  Diagnosis Date  . Fibromyalgia   . Migraine   . Thyroid disease    Past Surgical History  Procedure Laterality Date  . Tubal ligation    . Tonsillectomy    . Uterine ablation     Family History  Problem Relation Age of Onset  . Heart disease Mother   . Heart disease Father    History  Substance Use Topics  . Smoking status: Current Some Day Smoker    Types: Cigarettes  . Smokeless tobacco: Not on file  . Alcohol Use: No   OB History   Grav Para Term Preterm Abortions TAB SAB Ect Mult Living                 Review of Systems  All other systems reviewed and are negative.     Allergies  Review of patient's allergies indicates no known allergies.  Home Medications   Prior to Admission medications   Medication Sig Start Date End Date Taking? Authorizing Provider  acetaminophen (TYLENOL) 500 MG tablet Take 500 mg by mouth every 6 (six) hours as needed for mild pain or moderate pain.   Yes Historical Provider, MD   albuterol (PROVENTIL HFA;VENTOLIN HFA) 108 (90 BASE) MCG/ACT inhaler Inhale 1-2 puffs into the lungs every 6 (six) hours as needed for wheezing or shortness of breath.   Yes Historical Provider, MD  amoxicillin-clavulanate (AUGMENTIN) 875-125 MG per tablet Take 1 tablet by mouth 2 (two) times daily. 05/27/14 06/06/14 Yes Merlyn Albert, MD  buPROPion (WELLBUTRIN SR) 150 MG 12 hr tablet Take 150 mg by mouth See admin instructions. Take one tablet every morning for 3 days starting on 05/27/2014, then take one tablet twice daily thereafter   Yes Historical Provider, MD  guaiFENesin (MUCINEX) 600 MG 12 hr tablet Take 600 mg by mouth daily as needed for cough or to loosen phlegm.   Yes Historical Provider, MD  DULoxetine (CYMBALTA) 60 MG capsule Take 60 mg by mouth daily.    Historical Provider, MD  HYDROcodone-homatropine (HYCODAN) 5-1.5 MG/5ML syrup Take 5 mLs by mouth at bedtime as needed for cough. 05/27/14   Merlyn Albert, MD  levothyroxine (SYNTHROID, LEVOTHROID) 50 MCG tablet Take 50 mcg by mouth daily before breakfast.    Historical Provider, MD   BP 124/79  Pulse 100  Temp(Src) 98.6 F (37 C) (Oral)  Resp 100  SpO2 100% Physical Exam  Nursing note and vitals reviewed. Constitutional: She is oriented to person, place, and time. She appears well-developed  and well-nourished.  HENT:  Head: Normocephalic and atraumatic.  Right Ear: External ear normal.  Left Ear: External ear normal.  Nose: Nose normal.  Mouth/Throat: Oropharynx is clear and moist.  Eyes: Conjunctivae and EOM are normal. Pupils are equal, round, and reactive to light.  Neck: Normal range of motion. Neck supple. No JVD present. No tracheal deviation present. No thyromegaly present.  Cardiovascular: Normal rate, regular rhythm, normal heart sounds and intact distal pulses.   Pulmonary/Chest: Effort normal and breath sounds normal. No respiratory distress. She has no wheezes.  Some coughing on exam bilateral lower anterior  chest wall tenderness to palpation.  Abdominal: Soft. Bowel sounds are normal. She exhibits no mass. There is no tenderness. There is no guarding.  Musculoskeletal: Normal range of motion.  Lymphadenopathy:    She has no cervical adenopathy.  Neurological: She is alert and oriented to person, place, and time. She has normal reflexes. No cranial nerve deficit or sensory deficit. Gait normal. GCS eye subscore is 4. GCS verbal subscore is 5. GCS motor subscore is 6.  Reflex Scores:      Bicep reflexes are 2+ on the right side and 2+ on the left side.      Patellar reflexes are 2+ on the right side and 2+ on the left side. Strength is 5/5 bilateral elbow flexor/extensors, wrist extension/flexion, intrinsic hand strength equal Bilateral hip flexion/extension 5/5, knee flexion/extension 5/5, ankle 5/5 flexion extension    Skin: Skin is warm and dry.  Psychiatric: She has a normal mood and affect. Her behavior is normal. Judgment and thought content normal.    ED Course  Procedures (including critical care time) Labs Review Labs Reviewed - No data to display  Imaging Review No results found.   EKG Interpretation None      MDM   Final diagnoses:  Acute bronchitis, unspecified organism    Patient will be given an inhaler here and placed on low dose steroids. She is to continue her Augmentin. She is given a prescription for Tussionex. She does not have fever and has normal oxygen saturations. She has no rales on her exam. I have discussed return precautions and need for falls patient and she voices understanding.    Mary Quarryanielle S Dionicio Shelnutt, MD 05/28/14 [phone]

## 2014-05-28 NOTE — ED Notes (Signed)
Seen by PCP yesterday and dx with bronchitis and put on antibiotic. Pt states she has coughed so much that it is making her abdomen and rib area sore. Pt also states headache. NAD

## 2014-05-28 NOTE — Discharge Instructions (Signed)

## 2014-06-09 ENCOUNTER — Telehealth: Payer: Self-pay | Admitting: Family Medicine

## 2014-06-09 MED ORDER — FLUCONAZOLE 150 MG PO TABS: ORAL_TABLET | ORAL | Status: DC

## 2014-06-09 NOTE — Telephone Encounter (Signed)
Diflucan sent to [facility]. Left message on voicemail notifying patient.

## 2014-06-09 NOTE — Telephone Encounter (Signed)
Pt issued an antibiotic last week 8/4 an now has a yeast infection  Can we call in diflucan x2   [facility]

## 2014-07-28 ENCOUNTER — Ambulatory Visit (INDEPENDENT_AMBULATORY_CARE_PROVIDER_SITE_OTHER): Payer: Self-pay | Admitting: Nurse Practitioner

## 2014-07-28 ENCOUNTER — Encounter: Payer: Self-pay | Admitting: Nurse Practitioner

## 2014-07-28 ENCOUNTER — Encounter: Payer: Self-pay | Admitting: Family Medicine

## 2014-07-28 VITALS — BP 150/94 | Ht 63.0 in | Wt 157.0 lb

## 2014-07-28 DIAGNOSIS — F4323 Adjustment disorder with mixed anxiety and depressed mood: Secondary | ICD-10-CM

## 2014-07-28 DIAGNOSIS — R030 Elevated blood-pressure reading, without diagnosis of hypertension: Secondary | ICD-10-CM

## 2014-07-28 DIAGNOSIS — M797 Fibromyalgia: Secondary | ICD-10-CM

## 2014-07-28 MED ORDER — TRAZODONE HCL 50 MG PO TABS: 25.0000 mg | ORAL_TABLET | Freq: Every evening | ORAL | Status: DC | PRN

## 2014-07-30 ENCOUNTER — Telehealth: Payer: Self-pay | Admitting: Family Medicine

## 2014-07-30 NOTE — Telephone Encounter (Signed)
Patient was to call today and let Eber JonesCarolyn know how she is doing. She said the medication she was prescribed on Monday is working great and she does feel better.

## 2014-07-30 NOTE — Telephone Encounter (Signed)
Noted  

## 2014-08-03 ENCOUNTER — Encounter: Payer: Self-pay | Admitting: Nurse Practitioner

## 2014-08-03 DIAGNOSIS — F4323 Adjustment disorder with mixed anxiety and depressed mood: Secondary | ICD-10-CM | POA: Insufficient documentation

## 2014-08-03 NOTE — Progress Notes (Signed)
Subjective:  Presents for complaints of "heart fluttering" and stress worse over the weekend. Anxiety has been going on for several months. Has a daughter who is a heroin addict living out of state. Patient and her husband went to pick her up to bring her back home but her daughter changed her mind. Patient is in a constant state of worry and depression. Denies suicidal or homicidal thoughts or ideation. Has spells of "a pounding heart" with a squeezing or tightness in the chest more aware of it when she is trying to go to sleep. Not sleeping well. Working 13 hour shifts at times. [facility]. Diagnosis was palpitations and fibromyalgia. According to patient her EKG was normal. Has a copy of her lab work. Cymbalta 60 mg does not seem to be working as well as usual. Has increased her daily smoking. No alcohol use. Wants to avoid controlled substances if possible.  Objective:   BP 150/94  Ht 5\' 3"  (1.6 m)  Wt 157 lb (71.215 kg)  BMI 27.82 kg/m2 NAD. Alert, oriented. Crying at times during office visit. Fatigued in appearance. Mildly anxious affect. Thoughts logical coherent and relevant. Dressed appropriately. Lungs clear. Heart regular rate rhythm, no murmur or gallop noted. Lower extremities no edema. Lab work dated 10/15 shows a normal troponin IC scanned results.  Assessment:  Problem List Items Addressed This Visit     Musculoskeletal and Integument   Fibromyalgia affecting multiple sites     Other   Adjustment disorder with mixed anxiety and depressed mood - Primary    Other Visit Diagnoses   Elevated blood pressure (not hypertension)          Plan:  Meds ordered this encounter  Medications  . traZODone (DESYREL) 50 MG tablet    Sig: Take 0.5-1 tablets (25-50 mg total) by mouth at bedtime as needed for sleep.    Dispense:  30 tablet    Refill:  2    Order Specific Question:  Supervising Provider    Answer:  Merlyn AlbertLUKING, WILLIAM S [[id number]]   given work note.  Trial of trazodone to help with sleep. With a half tab at bedtime, increase to one tab if tolerated. DC med and call if any problems. Recommend mental health counseling. Return in about 1 month (around 08/28/2014). Call back sooner if any problems. Seek help immediately if any severe issues.

## 2014-08-04 ENCOUNTER — Encounter: Payer: Self-pay | Admitting: Family Medicine

## 2014-08-04 ENCOUNTER — Ambulatory Visit (INDEPENDENT_AMBULATORY_CARE_PROVIDER_SITE_OTHER): Payer: Self-pay | Admitting: Family Medicine

## 2014-08-04 VITALS — BP 138/82 | Ht 63.0 in | Wt 160.0 lb

## 2014-08-04 DIAGNOSIS — R002 Palpitations: Secondary | ICD-10-CM

## 2014-08-04 NOTE — Progress Notes (Signed)
   Subjective:    Patient ID: Mary BariKimberly D Cai, female    DOB: September 11, 1966, 48 y.o.   MRN: [id number]  HPI Patient arrives for a follow up on palpitations- was seen in ER and in our office last week(see 08/03/14 note in Epic) Patient states she can still feel the heart fluttering all the time.  Pt experiencing tremendous stress, her 47 year old daughter has become addicted to heroin. Having all types of difficulties. She lives in [place]. Mother scared for her.  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 No acute distress. Lungs clear. Heart rare rhythm. H&T normal.      Assessment & Plan:  Impression depression with anxiety and insomnia. Palpitations highly doubt serious pathology discussed. Plan patient encouraged to pursue counseling further. Maintain same medications. Followup with Eber JonesCarolyn has discussed. Advised patient that she is going through grief and anger and distress and there is no perfect medication for this. WSL

## 2014-08-27 ENCOUNTER — Telehealth: Payer: Self-pay | Admitting: Nurse Practitioner

## 2014-08-27 MED ORDER — LEVOTHYROXINE SODIUM 50 MCG PO TABS: 50.0000 ug | ORAL_TABLET | Freq: Every day | ORAL | Status: DC

## 2014-08-27 NOTE — Telephone Encounter (Signed)
Rx sent electronically to pharmacy. Patient notified. 

## 2014-08-27 NOTE — Telephone Encounter (Signed)
Patient said that she has been getting her levothyroxine (SYNTHROID, LEVOTHROID) 50 MCG [facility], but they are taking too long to get this filled for her.  She wants to know if we can refill this medication for her?   [facility]

## 2014-08-27 NOTE — Telephone Encounter (Signed)
Ok 6 ref 

## 2014-12-10 ENCOUNTER — Encounter: Payer: Self-pay | Admitting: Family Medicine

## 2014-12-10 ENCOUNTER — Ambulatory Visit (INDEPENDENT_AMBULATORY_CARE_PROVIDER_SITE_OTHER): Payer: Self-pay | Admitting: Family Medicine

## 2014-12-10 VITALS — BP 110/78 | Temp 98.6°F | Ht 63.0 in | Wt 158.0 lb

## 2014-12-10 DIAGNOSIS — J329 Chronic sinusitis, unspecified: Secondary | ICD-10-CM

## 2014-12-10 DIAGNOSIS — J310 Chronic rhinitis: Secondary | ICD-10-CM

## 2014-12-10 MED ORDER — SULFAMETHOXAZOLE-TRIMETHOPRIM 800-160 MG PO TABS: 1.0000 | ORAL_TABLET | Freq: Two times a day (BID) | ORAL | Status: DC

## 2014-12-10 NOTE — Patient Instructions (Signed)
aleave three tabs twice per day, or ibuprofen three tabs three times per day

## 2014-12-10 NOTE — Progress Notes (Signed)
   Subjective:    Patient ID: Mary [name], female    DOB: Mar 20, 1966, 49 y.o.   MRN: [id number]  Sinusitis This is a new problem. The current episode started in the past 7 days. The problem is unchanged. There has been no fever. The pain is moderate. Associated symptoms include congestion, ear pain, headaches, sinus pressure, a sore throat and swollen glands. Past treatments include nothing. The treatment provided no relief.   Patient states that she has no other concerns at this time.   Few days of cong and headache and neck discomfort  Neck pain and tender  Right and neck pain worse woth pressure and motions  minimsl vough   smoker  Review of Systems  HENT: Positive for congestion, ear pain, sinus pressure and sore throat.   Neurological: Positive for headaches.       Objective:   Physical Exam   Alert vital stable moderate malaise frontal maxillary tenderness right side more than left pharynx normal neck right tender cervical lymph nodes neck supple lungs clear heart regular in rhythm      Assessment & Plan:   impression rhinosinusitis/cervical lymphadenitis plan antibiotics prescribed. Symptomatic care discussed. Warning signs discussed. WSL

## 2014-12-14 ENCOUNTER — Emergency Department (HOSPITAL_COMMUNITY)
Admission: EM | Admit: 2014-12-14 | Discharge: 2014-12-14 | Disposition: A | Discharge status: Home / Self Care | Payer: Self-pay | Attending: Emergency Medicine | Admitting: Emergency Medicine

## 2014-12-14 ENCOUNTER — Encounter (HOSPITAL_COMMUNITY): Payer: Self-pay

## 2014-12-14 ENCOUNTER — Emergency Department (HOSPITAL_COMMUNITY): Payer: Self-pay

## 2014-12-14 DIAGNOSIS — R0981 Nasal congestion: Secondary | ICD-10-CM | POA: Insufficient documentation

## 2014-12-14 DIAGNOSIS — R5383 Other fatigue: Secondary | ICD-10-CM | POA: Insufficient documentation

## 2014-12-14 DIAGNOSIS — Z79899 Other long term (current) drug therapy: Secondary | ICD-10-CM | POA: Insufficient documentation

## 2014-12-14 DIAGNOSIS — F419 Anxiety disorder, unspecified: Secondary | ICD-10-CM | POA: Insufficient documentation

## 2014-12-14 DIAGNOSIS — R05 Cough: Secondary | ICD-10-CM | POA: Insufficient documentation

## 2014-12-14 DIAGNOSIS — Z8679 Personal history of other diseases of the circulatory system: Secondary | ICD-10-CM | POA: Insufficient documentation

## 2014-12-14 DIAGNOSIS — M549 Dorsalgia, unspecified: Secondary | ICD-10-CM | POA: Insufficient documentation

## 2014-12-14 DIAGNOSIS — R0602 Shortness of breath: Secondary | ICD-10-CM | POA: Insufficient documentation

## 2014-12-14 DIAGNOSIS — Z720 Tobacco use: Secondary | ICD-10-CM | POA: Insufficient documentation

## 2014-12-14 DIAGNOSIS — R51 Headache: Secondary | ICD-10-CM | POA: Insufficient documentation

## 2014-12-14 DIAGNOSIS — J029 Acute pharyngitis, unspecified: Secondary | ICD-10-CM | POA: Insufficient documentation

## 2014-12-14 DIAGNOSIS — M791 Myalgia: Secondary | ICD-10-CM | POA: Insufficient documentation

## 2014-12-14 DIAGNOSIS — E079 Disorder of thyroid, unspecified: Secondary | ICD-10-CM | POA: Insufficient documentation

## 2014-12-14 DIAGNOSIS — Z792 Long term (current) use of antibiotics: Secondary | ICD-10-CM | POA: Insufficient documentation

## 2014-12-14 DIAGNOSIS — R509 Fever, unspecified: Secondary | ICD-10-CM | POA: Insufficient documentation

## 2014-12-14 DIAGNOSIS — R6889 Other general symptoms and signs: Secondary | ICD-10-CM

## 2014-12-14 LAB — BASIC METABOLIC PANEL
Anion gap: 6 (ref 5–15)
BUN: 12 mg/dL (ref 6–23)
CO2: 21 mmol/L (ref 19–32)
Calcium: 8.5 mg/dL (ref 8.4–10.5)
Chloride: 106 mmol/L (ref 96–112)
Creatinine, Ser: 0.94 mg/dL (ref 0.50–1.10)
GFR calc Af Amer: 82 mL/min — ABNORMAL LOW (ref >90)
GFR calc non Af Amer: 71 mL/min — ABNORMAL LOW (ref >90)
Glucose, Bld: 102 mg/dL — ABNORMAL HIGH (ref 70–99)
Potassium: 3.9 mmol/L (ref 3.5–5.1)
Sodium: 133 mmol/L — ABNORMAL LOW (ref 135–145)

## 2014-12-14 LAB — CBC WITH DIFFERENTIAL/PLATELET
Basophils Absolute: 0 10*3/uL (ref 0.0–0.1)
Basophils Relative: 0 % (ref 0–1)
Eosinophils Absolute: 0 10*3/uL (ref 0.0–0.7)
Eosinophils Relative: 0 % (ref 0–5)
HCT: 36.1 % (ref 36.0–46.0)
Hemoglobin: 12.5 g/dL (ref 12.0–15.0)
Lymphocytes Relative: 12 % (ref 12–46)
Lymphs Abs: 1 10*3/uL (ref 0.7–4.0)
MCH: 31.9 pg (ref 26.0–34.0)
MCHC: 34.6 g/dL (ref 30.0–36.0)
MCV: 92.1 fL (ref 78.0–100.0)
Monocytes Absolute: 0.9 10*3/uL (ref 0.1–1.0)
Monocytes Relative: 10 % (ref 3–12)
Neutro Abs: 6.6 10*3/uL (ref 1.7–7.7)
Neutrophils Relative %: 78 % — ABNORMAL HIGH (ref 43–77)
Platelets: 236 10*3/uL (ref 150–400)
RBC: 3.92 MIL/uL (ref 3.87–5.11)
RDW: 11.9 % (ref 11.5–15.5)
WBC: 8.5 10*3/uL (ref 4.0–10.5)

## 2014-12-14 LAB — I-STAT CG4 LACTIC ACID, ED: Lactic Acid, Venous: 0.33 mmol/L — ABNORMAL LOW (ref 0.5–2.0)

## 2014-12-14 MED ORDER — KETOROLAC TROMETHAMINE 30 MG/ML IJ SOLN
30.0000 mg | Freq: Once | INTRAMUSCULAR | Status: AC
  Administered 2014-12-14: 30 mg via INTRAVENOUS
  Filled 2014-12-14: qty 1

## 2014-12-14 MED ORDER — SODIUM CHLORIDE 0.9 % IV BOLUS (SEPSIS)
1000.0000 mL | Freq: Once | INTRAVENOUS | Status: AC
  Administered 2014-12-14: 1000 mL via INTRAVENOUS

## 2014-12-14 MED ORDER — MORPHINE SULFATE 4 MG/ML IJ SOLN
4.0000 mg | Freq: Once | INTRAMUSCULAR | Status: AC
  Administered 2014-12-14: 4 mg via INTRAVENOUS
  Filled 2014-12-14: qty 1

## 2014-12-14 MED ORDER — FEXOFENADINE-PSEUDOEPHED ER 180-240 MG PO TB24: 1.0000 | ORAL_TABLET | Freq: Every day | ORAL | Status: DC

## 2014-12-14 NOTE — ED Notes (Signed)
edp aware of hypotension.  OK to d/c patient per Dr. Juleen ChinaKohut.  Instructed pt to return if starts to feel dizzy or lightheaded and encouraged pt to drink fluids.

## 2014-12-14 NOTE — ED Provider Notes (Addendum)
Discharged delayed as pt noted to be hypotensive. First noted after dose of morphine. Presenting normotensive. Still mild hypotension, but subjectively feels better. Able to ambulate to bathroom. Making good urine. Lactic acid normal. Does not look toxic. I feel she is appropriate for discharge at this time. Pt feels comfortable going home. Shared decision making and pt discharged with strict return precautions.     Raeford RazorStephen Haven Pylant, MD 12/14/14 [phone]

## 2014-12-14 NOTE — Discharge Instructions (Signed)

## 2014-12-14 NOTE — ED Notes (Signed)
Pt c/o fever, body aches, cough since yesterday, tylenol last at 5:40am for fever over 102

## 2014-12-14 NOTE — ED Notes (Signed)
Pt's bp 80/39, edp aware and was instructed to administer 1 liter of ns.

## 2014-12-14 NOTE — ED Provider Notes (Signed)
CSN: [id number]     Arrival date & time 12/14/14  0607 History   First MD Initiated Contact with Patient 12/14/14 [phone]     Chief Complaint  Patient presents with  . Fever    Patient is a 49 y.o. female presenting with fever. The history is provided by the patient and a significant other.  Fever Max temp prior to arrival:  102 Severity:  Moderate Onset quality:  Sudden Duration: several hours. Timing:  Constant Progression:  Worsening Chronicity:  New Relieved by:  Nothing Worsened by:  Nothing tried Associated symptoms: chills, congestion, cough, headaches, myalgias and sore throat   Associated symptoms: no diarrhea and no vomiting   Risk factors: sick contacts   Risk factors: no immunosuppression and no recent travel   Pt reports she began to have cough/congestion approximately 3 days ago She was seen by PCP and found to have sinusitis and placed on bactrim Over the past 24 hours her symptoms have worsened.  She now reports fever, cough, HA, myalgias and sore throat.  She just took APAP prior to arrival to the ER  No vomiting/diarrhea reported   Past Medical History  Diagnosis Date  . Fibromyalgia   . Migraine   . Thyroid disease    Past Surgical History  Procedure Laterality Date  . Tubal ligation    . Tonsillectomy    . Uterine ablation     Family History  Problem Relation Age of Onset  . Heart disease Mother   . Heart disease Father    History  Substance Use Topics  . Smoking status: Current Some Day Smoker    Types: Cigarettes  . Smokeless tobacco: Not on file  . Alcohol Use: No   OB History    No data available     Review of Systems  Constitutional: Positive for fever, chills and fatigue.  HENT: Positive for congestion and sore throat.   Respiratory: Positive for cough and shortness of breath.   Gastrointestinal: Negative for vomiting and diarrhea.  Musculoskeletal: Positive for myalgias and back pain.  Neurological: Positive for headaches.   Psychiatric/Behavioral: The patient is nervous/anxious.   All other systems reviewed and are negative.     Allergies  Review of patient's allergies indicates no known allergies.  Home Medications   Prior to Admission medications   Medication Sig Start Date End Date Taking? Authorizing Provider  acetaminophen (TYLENOL) 500 MG tablet Take 500 mg by mouth every 6 (six) hours as needed for mild pain or moderate pain.   Yes Historical Provider, MD  FLUoxetine (PROZAC) 10 MG capsule Take 10 mg by mouth daily.   Yes Historical Provider, MD  levothyroxine (SYNTHROID, LEVOTHROID) 50 MCG tablet Take 1 tablet (50 mcg total) by mouth daily before breakfast. 08/27/14  Yes Campbell Riches, NP  sulfamethoxazole-trimethoprim (BACTRIM DS,SEPTRA DS) 800-160 MG per tablet Take 1 tablet by mouth 2 (two) times daily. 12/10/14  Yes Merlyn Albert, MD  traZODone (DESYREL) 50 MG tablet Take 0.5-1 tablets (25-50 mg total) by mouth at bedtime as needed for sleep. 07/28/14   Campbell Riches, NP   BP 113/78 mmHg  Pulse 107  Temp(Src) 101.5 F (38.6 C) (Oral)  Resp 24  Ht  (1.6 m)  Wt 150 lb (68.04 kg)  BMI 26.58 kg/m2  SpO2 96% Physical Exam CONSTITUTIONAL: Well developed/well nourished, anxious. Pt coughing during exam HEAD: Normocephalic/atraumatic EYES: EOMI/PERRL ENMT: Mucous membranes moist. Uvula midline with mild erythema of pharynx but no exudates NECK:  supple no meningeal signs SPINE/BACK:entire spine nontender CV: S1/S2 noted, no murmurs/rubs/gallops noted LUNGS: Lungs are clear to auscultation bilaterally, no apparent distress ABDOMEN: soft, nontender, no rebound or guarding, bowel sounds noted throughout abdomen GU:no cva tenderness NEURO: Pt is awake/alert/appropriate, moves all extremitiesx4.  No facial droop.   EXTREMITIES: pulses normal/equal, full ROM SKIN: warm, color normal PSYCH: no abnormalities of mood noted, alert and oriented to situation  ED Course  Procedures   6:40 AM Pt presenting with flu like illness - fever/cough/HA/sore throat/myalgias She is hemodynamically stable at this time Will give IV fluids and check imaging/labs Pt agreeable with plan 7:09 AM Signed out to Dr Juleen ChinaKohut to f/u on imaging/labs Pt with probable flu like illness I have low suspicion for meningitis Pt/family agreeable with plan Labs Review Labs Reviewed  CBC WITH DIFFERENTIAL/PLATELET - Abnormal; Notable for the following:    Neutrophils Relative % 78 (*)    All other components within normal limits  BASIC METABOLIC PANEL    Medications  morphine 4 MG/ML injection 4 mg (not administered)  ketorolac (TORADOL) 30 MG/ML injection 30 mg (30 mg Intravenous Given 12/14/14 0645)  sodium chloride 0.9 % bolus 1,000 mL (1,000 mLs Intravenous New Bag/Given 12/14/14 [id number])     MDM   Final diagnoses:  Flu-like symptoms    Nursing notes including past medical history and social history reviewed and considered in documentation Labs/vital reviewed myself and considered during evaluation     Joya Gaskinsonald W Misa Fedorko, MD 12/14/14 [phone]

## 2014-12-14 NOTE — ED Notes (Signed)
Patient assisted to bathroom, gait better-little assistance needed. Blood pressure increasing. Per patient feels better. EDP made aware.

## 2014-12-14 NOTE — ED Notes (Signed)
Patient's blood pressure still abnormal low, Dr Juleen ChinaKohut aware-order given.

## 2014-12-14 NOTE — ED Notes (Addendum)
Pt reports running a fever, took tylenol around 0540. Pt presents w/ productive cough & complaining of head hurting & generalized body aches, pt denies any vomiting or diarrhea.. Pt states antibiotics on Wednesday.

## 2014-12-18 ENCOUNTER — Encounter: Payer: Self-pay | Admitting: Family Medicine

## 2014-12-18 ENCOUNTER — Ambulatory Visit (INDEPENDENT_AMBULATORY_CARE_PROVIDER_SITE_OTHER): Payer: Self-pay | Admitting: Family Medicine

## 2014-12-18 VITALS — BP 132/88 | Temp 98.4°F | Ht 63.0 in | Wt 155.0 lb

## 2014-12-18 DIAGNOSIS — J310 Chronic rhinitis: Secondary | ICD-10-CM

## 2014-12-18 DIAGNOSIS — J329 Chronic sinusitis, unspecified: Secondary | ICD-10-CM

## 2014-12-18 MED ORDER — ALBUTEROL SULFATE HFA 108 (90 BASE) MCG/ACT IN AERS: 2.0000 | INHALATION_SPRAY | Freq: Four times a day (QID) | RESPIRATORY_TRACT | Status: DC | PRN

## 2014-12-18 MED ORDER — AMOXICILLIN-POT CLAVULANATE 875-125 MG PO TABS: 1.0000 | ORAL_TABLET | Freq: Two times a day (BID) | ORAL | Status: AC

## 2014-12-18 NOTE — Progress Notes (Signed)
   Subjective:    Patient ID: Mary [name], female    DOB: 1966/09/03, 49 y.o.   MRN: [id number]  Cough This is a new problem. Episode onset: Was seen here last Wed and went to ER on 2/21. The problem has been gradually improving. The cough is productive of sputum. Associated symptoms include ear pain, a fever, headaches, hemoptysis, nasal congestion, rhinorrhea, a sore throat and wheezing. Treatments tried: Tylenol and IBU. The treatment provided mild relief.      Review of Systems  Constitutional: Positive for fever.  HENT: Positive for ear pain, rhinorrhea and sore throat.   Respiratory: Positive for cough, hemoptysis and wheezing.   Neurological: Positive for headaches.       Objective:   Physical Exam  Alert substantial malaise HET moderate nasal congestion frontal tenderness pharynx normal lungs clear heart regular in rhythm.      Assessment & Plan:  Impression post flu rhinosinusitis plan antibiotics prescribed. Since Medicare discussed. Warning signs discussed. Encouraged to stop smoking. Albuterol when necessary for any wheeze WSL

## 2014-12-23 ENCOUNTER — Telehealth: Payer: Self-pay | Admitting: Family Medicine

## 2014-12-23 NOTE — Telephone Encounter (Signed)
Pt is requesting diflucan for a yeast infection pt is on antibiotics. [facility] [place]

## 2014-12-25 ENCOUNTER — Other Ambulatory Visit: Payer: Self-pay | Admitting: Nurse Practitioner

## 2014-12-25 MED ORDER — FLUCONAZOLE 150 MG PO TABS: ORAL_TABLET | ORAL | Status: DC

## 2015-03-26 ENCOUNTER — Other Ambulatory Visit: Payer: Self-pay | Admitting: *Deleted

## 2015-03-26 ENCOUNTER — Telehealth: Payer: Self-pay | Admitting: *Deleted

## 2015-03-26 MED ORDER — HYDROCODONE-ACETAMINOPHEN 5-325 MG PO TABS: ORAL_TABLET | ORAL | Status: DC

## 2015-03-26 NOTE — Telephone Encounter (Signed)
Pt walked in office having a bad headache. Started yesterday. Having left side neck pain. Under a lot of stress at work. Consult with Dr. Brett CanalesSteve. Hydrocodone 5/325 #8 one q 4 -6 hours prn headache. Office visit next week with Dr. Brett CanalesSteve. Script given to pt. appt on Monday.

## 2015-03-30 ENCOUNTER — Ambulatory Visit (INDEPENDENT_AMBULATORY_CARE_PROVIDER_SITE_OTHER): Payer: Self-pay | Admitting: Family Medicine

## 2015-03-30 ENCOUNTER — Encounter: Payer: Self-pay | Admitting: Family Medicine

## 2015-03-30 VITALS — BP 130/82 | Ht 63.0 in | Wt 162.0 lb

## 2015-03-30 DIAGNOSIS — G44209 Tension-type headache, unspecified, not intractable: Secondary | ICD-10-CM

## 2015-03-30 DIAGNOSIS — R002 Palpitations: Secondary | ICD-10-CM

## 2015-03-30 MED ORDER — NAPROXEN 500 MG PO TABS: 500.0000 mg | ORAL_TABLET | Freq: Two times a day (BID) | ORAL | Status: DC

## 2015-03-30 MED ORDER — CHLORZOXAZONE 500 MG PO TABS: 500.0000 mg | ORAL_TABLET | Freq: Three times a day (TID) | ORAL | Status: DC | PRN

## 2015-03-30 NOTE — Progress Notes (Signed)
   Subjective:    Patient ID: Mary [name], female    DOB: 04-24-1966, 49 y.o.   MRN: [id number]  HPI  Patient arrives to discus stress and anxiety. Patient also having issues with her migraines.  On further history headaches are daily. Symmetric. Right temporal. Sometimes related to stress. Often radiating to the neck.  Left supra clavicular pain.  Still expressing palpitations.  Has been going to use Haven. Prozac just increase to 30 mg. Having tremendous stress with work these days.   Review of Systems No loss of consciousness no chest pain no nausea no diaphoresis    Objective:   Physical Exam Alert vitals stable blood pressure good HEENT normal neck supple positive suprascapular tenderness left side lungs clear heart revealed 1 PVC with auscultation       Assessment & Plan:  Impression 1 tension headaches not migraine discussed #2 PVCs cardiology referral is not necessary discussed #3 chronic stress with element of depression anxiety plan encouraged to maintain with mental health. Start exercising. Naprosyn and chlorzoxazone for neck strain.

## 2015-04-30 ENCOUNTER — Telehealth: Payer: Self-pay | Admitting: Family Medicine

## 2015-04-30 MED ORDER — LEVOTHYROXINE SODIUM 50 MCG PO TABS: 50.0000 ug | ORAL_TABLET | Freq: Every day | ORAL | Status: DC

## 2015-04-30 NOTE — Telephone Encounter (Signed)
levothyroxine (SYNTHROID, LEVOTHROID) 50 MCG tablet  Pt states that she would like to go ahead an get this med [facility]   She has 3 pills left   Please advise

## 2015-04-30 NOTE — Telephone Encounter (Signed)
Rx refilled per protocol. Rx sent electronically to pharmacy. Patient [facility] and [facility] and will have a copy of results sent to Dr Brett CanalesSteve.

## 2015-05-27 ENCOUNTER — Telehealth: Payer: Self-pay | Admitting: Family Medicine

## 2015-05-27 NOTE — Telephone Encounter (Signed)
Can call work if you can't reach her on cell  [phone]  xt [phone]

## 2015-05-27 NOTE — Telephone Encounter (Signed)
Notified patient not uncommon to get rashes like this with excess exposure so should fade. Patient verbalized understanding.

## 2015-05-27 NOTE — Telephone Encounter (Signed)
Pt states she went to the beach an spent a few days in the sun,  She has a reddish bruising to her lower legs, kind of a rash look to  It, no fever, not painful, not itchy any more (was at first) put noxema  For the itchy sting it had.   Pt does not have insurance, she is unsure if she should let  It go for a few more days or does she absolutely need to be seen?   [facility]

## 2015-05-27 NOTE — Telephone Encounter (Signed)
Not uncommon to get rashes like this with excess exposure so shoud fade

## 2015-06-10 ENCOUNTER — Encounter (HOSPITAL_COMMUNITY): Payer: Self-pay

## 2015-06-10 ENCOUNTER — Telehealth: Payer: Self-pay | Admitting: Family Medicine

## 2015-06-10 ENCOUNTER — Emergency Department (HOSPITAL_COMMUNITY)
Admission: EM | Admit: 2015-06-10 | Discharge: 2015-06-10 | Disposition: A | Discharge status: Home / Self Care | Payer: Self-pay | Attending: Physician Assistant | Admitting: Physician Assistant

## 2015-06-10 DIAGNOSIS — M5442 Lumbago with sciatica, left side: Secondary | ICD-10-CM | POA: Insufficient documentation

## 2015-06-10 DIAGNOSIS — Z87891 Personal history of nicotine dependence: Secondary | ICD-10-CM | POA: Insufficient documentation

## 2015-06-10 DIAGNOSIS — Z79899 Other long term (current) drug therapy: Secondary | ICD-10-CM | POA: Insufficient documentation

## 2015-06-10 DIAGNOSIS — E079 Disorder of thyroid, unspecified: Secondary | ICD-10-CM | POA: Insufficient documentation

## 2015-06-10 DIAGNOSIS — N390 Urinary tract infection, site not specified: Secondary | ICD-10-CM | POA: Insufficient documentation

## 2015-06-10 DIAGNOSIS — M797 Fibromyalgia: Secondary | ICD-10-CM | POA: Insufficient documentation

## 2015-06-10 DIAGNOSIS — G43909 Migraine, unspecified, not intractable, without status migrainosus: Secondary | ICD-10-CM | POA: Insufficient documentation

## 2015-06-10 LAB — URINE MICROSCOPIC-ADD ON

## 2015-06-10 LAB — URINALYSIS, ROUTINE W REFLEX MICROSCOPIC
Bilirubin Urine: NEGATIVE
Glucose, UA: NEGATIVE mg/dL
Ketones, ur: NEGATIVE mg/dL
Nitrite: NEGATIVE
Protein, ur: NEGATIVE mg/dL
Specific Gravity, Urine: 1.015 (ref 1.005–1.030)
Urobilinogen, UA: 0.2 mg/dL (ref 0.0–1.0)
pH: 6.5 (ref 5.0–8.0)

## 2015-06-10 MED ORDER — PHENAZOPYRIDINE HCL 100 MG PO TABS
200.0000 mg | ORAL_TABLET | Freq: Once | ORAL | Status: AC
  Administered 2015-06-10: 200 mg via ORAL
  Filled 2015-06-10: qty 2

## 2015-06-10 MED ORDER — CYCLOBENZAPRINE HCL 10 MG PO TABS
10.0000 mg | ORAL_TABLET | Freq: Once | ORAL | Status: AC
  Administered 2015-06-10: 10 mg via ORAL
  Filled 2015-06-10: qty 1

## 2015-06-10 MED ORDER — SULFAMETHOXAZOLE-TRIMETHOPRIM 800-160 MG PO TABS: 1.0000 | ORAL_TABLET | Freq: Two times a day (BID) | ORAL | Status: AC

## 2015-06-10 MED ORDER — PHENAZOPYRIDINE HCL 200 MG PO TABS: 200.0000 mg | ORAL_TABLET | Freq: Three times a day (TID) | ORAL | Status: DC

## 2015-06-10 MED ORDER — SULFAMETHOXAZOLE-TRIMETHOPRIM 800-160 MG PO TABS
1.0000 | ORAL_TABLET | Freq: Once | ORAL | Status: AC
  Administered 2015-06-10: 1 via ORAL
  Filled 2015-06-10: qty 1

## 2015-06-10 MED ORDER — HYDROCODONE-ACETAMINOPHEN 5-325 MG PO TABS: 1.0000 | ORAL_TABLET | ORAL | Status: DC | PRN

## 2015-06-10 MED ORDER — CYCLOBENZAPRINE HCL 10 MG PO TABS: 10.0000 mg | ORAL_TABLET | Freq: Two times a day (BID) | ORAL | Status: DC | PRN

## 2015-06-10 NOTE — Telephone Encounter (Signed)
Notified patient (Per Dr. Lorin Picket) that she go to the nearest ER or urgent care for treatment. We do not recommend those symptoms waiting until tomorrow. Patient agreed and verbalized understanding.

## 2015-06-10 NOTE — ED Provider Notes (Signed)
CSN: [id number]     Arrival date & time 06/10/15  1823 History   First MD Initiated Contact with Patient 06/10/15 1833     Chief Complaint  Patient presents with  . Back Pain     (Consider location/radiation/quality/duration/timing/severity/associated sxs/prior Treatment) Patient is a 49 y.o. female presenting with back pain. The history is provided by the patient.  Back Pain Location:  Lumbar spine Quality:  Aching Radiates to: left buttock. Pain severity:  Severe Duration:  2 days Timing:  Intermittent Progression:  Worsening Chronicity:  New Relieved by:  Nothing Worsened by:  Movement and ambulation Ineffective treatments:  Heating pad and lying down Associated symptoms: no bladder incontinence, no bowel incontinence and no dysuria    Mary [name] is a 49 y.o. female who presents to the ED with low back pain that started 2 days ago. She called her PCP today and tried to get them to call in medication for pain and possible UTI. Dr. Catalina Pizza her to go to Urgent Care or ED to be evaluated before starting medication.   Past Medical History  Diagnosis Date  . Fibromyalgia   . Migraine   . Thyroid disease    Past Surgical History  Procedure Laterality Date  . Tubal ligation    . Tonsillectomy    . Uterine ablation     Family History  Problem Relation Age of Onset  . Heart disease Mother   . Heart disease Father    Social History  Substance Use Topics  . Smoking status: Former Smoker    Types: Cigarettes  . Smokeless tobacco: None     Comment: quit feb 2016  . Alcohol Use: No   OB History    No data available     Review of Systems  Gastrointestinal: Negative for bowel incontinence.  Genitourinary: Negative for bladder incontinence and dysuria.  Musculoskeletal: Positive for back pain.  all other systems negative    Allergies  Review of patient's allergies indicates no known allergies.  Home Medications   Prior to Admission medications   Medication  Sig Start Date End Date Taking? Authorizing Provider  fexofenadine-pseudoephedrine (ALLEGRA-D ALLERGY & CONGESTION) 180-240 MG per 24 hr tablet Take 1 tablet by mouth daily. Patient taking differently: Take 1 tablet by mouth daily as needed (for allergies).  12/14/14  Yes Raeford Razor, MD  FLUoxetine (PROZAC) 40 MG capsule Take 40 mg by mouth daily.   Yes Historical Provider, MD  levothyroxine (SYNTHROID, LEVOTHROID) 50 MCG tablet Take 1 tablet (50 mcg total) by mouth daily before breakfast. 04/30/15  Yes Merlyn Albert, MD  cyclobenzaprine (FLEXERIL) 10 MG tablet Take 1 tablet (10 mg total) by mouth 2 (two) times daily as needed for muscle spasms. 06/10/15   Selden Noteboom Orlene Och, NP  HYDROcodone-acetaminophen (NORCO/VICODIN) 5-325 MG per tablet Take 1 tablet by mouth every 4 (four) hours as needed. 06/10/15   Errica Dutil Orlene Och, NP  phenazopyridine (PYRIDIUM) 200 MG tablet Take 1 tablet (200 mg total) by mouth 3 (three) times daily. 06/10/15   Rebecca Cairns Orlene Och, NP  sulfamethoxazole-trimethoprim (BACTRIM DS,SEPTRA DS) 800-160 MG per tablet Take 1 tablet by mouth 2 (two) times daily. 06/10/15 06/17/15  Rosamund Nyland Orlene Och, NP   BP 126/78 mmHg  Pulse 70  Temp(Src) 98.6 F (37 C) (Oral)  Resp 16  Ht 5\' 3"  (1.6 m)  Wt 160 lb (72.576 kg)  BMI 28.35 kg/m2  SpO2 100% Physical Exam  Constitutional: She is oriented to person, place, and time.  She appears well-developed and well-nourished. No distress.  HENT:  Head: Normocephalic and atraumatic.  Right Ear: Tympanic membrane normal.  Left Ear: Tympanic membrane normal.  Nose: Nose normal.  Mouth/Throat: Uvula is midline, oropharynx is clear and moist and mucous membranes are normal.  Eyes: EOM are normal.  Neck: Normal range of motion. Neck supple.  Cardiovascular: Normal rate and regular rhythm.   Pulmonary/Chest: Effort normal. She has no wheezes. She has no rales.  Abdominal: Soft. Bowel sounds are normal. There is no tenderness.  Musculoskeletal: Normal range of motion.        Lumbar back: She exhibits tenderness, pain and spasm. She exhibits normal pulse.  Neurological: She is alert and oriented to person, place, and time. She has normal strength. No cranial nerve deficit or sensory deficit. Gait normal.  Reflex Scores:      Bicep reflexes are 2+ on the right side and 2+ on the left side.      Brachioradialis reflexes are 2+ on the right side and 2+ on the left side.      Patellar reflexes are 2+ on the right side and 2+ on the left side.      Achilles reflexes are 2+ on the right side and 2+ on the left side. Skin: Skin is warm and dry.  Psychiatric: She has a normal mood and affect. Her behavior is normal.  Nursing note and vitals reviewed.   ED Course  Procedures (including critical care time) Labs Review Results for orders placed or performed during the hospital encounter of 06/10/15 (from the past 24 hour(s))  Urinalysis, Routine w reflex microscopic (not at [facility])     Status: Abnormal   Collection Time: 06/10/15  6:48 PM  Result Value Ref Range   Color, Urine YELLOW YELLOW   APPearance CLEAR CLEAR   Specific Gravity, Urine 1.015 1.005 - 1.030   pH 6.5 5.0 - 8.0   Glucose, UA NEGATIVE NEGATIVE mg/dL   Hgb urine dipstick SMALL (A) NEGATIVE   Bilirubin Urine NEGATIVE NEGATIVE   Ketones, ur NEGATIVE NEGATIVE mg/dL   Protein, ur NEGATIVE NEGATIVE mg/dL   Urobilinogen, UA 0.2 0.0 - 1.0 mg/dL   Nitrite NEGATIVE NEGATIVE   Leukocytes, UA SMALL (A) NEGATIVE  Urine microscopic-add on     Status: None   Collection Time: 06/10/15  6:48 PM  Result Value Ref Range   Squamous Epithelial / LPF RARE RARE   WBC, UA 7-10 <3 WBC/hpf   RBC / HPF 3-6 <3 RBC/hpf   Bacteria, UA RARE RARE     Imaging Review No results found. I have personally reviewed the lab results as part of my medical decision-making.  Bactrim, Pyridium, Flexeril, Norco  MDM  49 y.o. female with low back pain and UTI. Will treat for UTI and back pain and she will follow up with her  PCP urine sent for culture. Discussed with the patient and all questioned fully answered. She will return if any problems arise.   Final diagnoses:  Left-sided low back pain with left-sided sciatica  UTI (lower urinary tract infection)       Janne Napoleon, NP 06/11/15 0009  Courteney Randall An, MD 06/11/15 1448

## 2015-06-10 NOTE — Telephone Encounter (Signed)
Patient is having pain in the bottom part of her back and into kidney area.  She said it hurt so bad last night that it woke her up.  Heating pad makes it feel better.  She said she has had some pains in her front abdomen that were not bad.  I advised her that she would more than likely need an appointment for this, but she wanted me to send back a message to be sure.  She said that she would be available tomorrow for an appointment if necessary.  [facility]

## 2015-06-10 NOTE — Discharge Instructions (Signed)
Your urine shows that you may have an early infection. We have sent the urine for culture. If we need to change your antibiotics someone will call you. We are also treating you for muscle strain of your back. Do not take the narcotic or muscle relaxant if driving because they will make you sleepy. Follow up with Dr. Gerda Diss or return here as needed for worsening symptoms.

## 2015-06-10 NOTE — ED Notes (Signed)
Pt c/o pain in lower back x 2 days.  Reports pain is slightly worse on the left side.  Denies any pain or burning with urination.  Denies injury.

## 2015-06-12 ENCOUNTER — Ambulatory Visit: Payer: Self-pay | Admitting: Family Medicine

## 2015-06-12 LAB — URINE CULTURE: Culture: NO GROWTH

## 2015-08-11 ENCOUNTER — Telehealth: Payer: Self-pay | Admitting: Family Medicine

## 2015-08-11 MED ORDER — PREDNISONE 20 MG PO TABS: ORAL_TABLET | ORAL | Status: DC

## 2015-08-11 NOTE — Telephone Encounter (Signed)
Pt is currently uninsured, please try to avoid a expensive cream

## 2015-08-11 NOTE — Telephone Encounter (Signed)
[place] an thinks she [place] up some  Chiggers. She has red bumps, itchy, all over her right side an stomach area,  Going down to her groin. She has tried OTC not working.   Is there something we can call in for her.    [facility]

## 2015-08-11 NOTE — Telephone Encounter (Signed)
Oral pred likely cheaper adult taper

## 2015-08-11 NOTE — Telephone Encounter (Signed)
Called patient and informed her per Dr.Steve Luking- Prednisone was sent in to requested pharmacy. Informed patient to take 3 tablets by mouth for 3 days, then take 2 tablets by mouth for 3 days, then take 1 tablet by mouth for 2 days. Patient verbalized understanding.

## 2015-09-21 ENCOUNTER — Telehealth: Payer: Self-pay | Admitting: Family Medicine

## 2015-09-21 NOTE — Telephone Encounter (Signed)
Please schedule appt! Thanks

## 2015-09-21 NOTE — Telephone Encounter (Signed)
Pt is calling to say that she is having heart palpitations again, off an on She is on prozac currently, feels that's working for her depression issues But feeling these issues are possible anxiety or thyroid related. She is fatigued And weak from these episodes.  She is needing the latest in the day appt she can get this week. [name]   Please advise

## 2015-09-21 NOTE — Telephone Encounter (Signed)
appt made for wed afternoon

## 2015-09-23 ENCOUNTER — Ambulatory Visit (INDEPENDENT_AMBULATORY_CARE_PROVIDER_SITE_OTHER): Payer: Self-pay | Admitting: Nurse Practitioner

## 2015-09-23 ENCOUNTER — Encounter: Payer: Self-pay | Admitting: Nurse Practitioner

## 2015-09-23 VITALS — BP 136/82 | Ht 63.0 in | Wt 168.0 lb

## 2015-09-23 DIAGNOSIS — M797 Fibromyalgia: Secondary | ICD-10-CM

## 2015-09-23 DIAGNOSIS — Z13220 Encounter for screening for lipoid disorders: Secondary | ICD-10-CM

## 2015-09-23 DIAGNOSIS — R002 Palpitations: Secondary | ICD-10-CM

## 2015-09-23 DIAGNOSIS — Z79899 Other long term (current) drug therapy: Secondary | ICD-10-CM

## 2015-09-24 ENCOUNTER — Encounter: Payer: Self-pay | Admitting: Family Medicine

## 2015-09-25 ENCOUNTER — Encounter: Payer: Self-pay | Admitting: Nurse Practitioner

## 2015-09-25 DIAGNOSIS — R002 Palpitations: Secondary | ICD-10-CM | POA: Insufficient documentation

## 2015-09-25 NOTE — Progress Notes (Signed)
Subjective:  Presents for recheck on her palpitations. Has had this off and on for about a year. Has been uninsured until this point. Palpitations occur daily. Worse when she is at work. Not as much when she is at home. Describes sensation as a strong beat followed by a slight squeeze and then a brief irregular beat. No syncopal episodes. Sometimes takes a deep breath during these episodes. No unusual shortness of breath with activity. Fatigue. Has been diagnosed with fibromyalgia. Chronic insomnia. Receives counseling. Drinks 2 cups of coffee, no other caffeine. No OTC meds or supplements. FMH: Her father had a heart attack in his 6950s. Her current cholesterol status is unknown. Former smoker.  Objective:   BP 136/82 mmHg  Ht 5\' 3"  (1.6 m)  Wt 168 lb (76.204 kg)  BMI 29.77 kg/m2 NAD. Alert, oriented. Lungs clear. Heart slightly irregular rate, apical pulse 88. No murmur or gallop noted. Lower extremities no edema. EKG shows occasional PVC and PAC. Results reviewed with Dr. Lorin PicketScott.  Assessment:  Problem List Items Addressed This Visit      Musculoskeletal and Integument   Fibromyalgia affecting multiple sites   Relevant Orders   CBC with Differential/Platelet   Basic metabolic panel     Other   Palpitations - Primary   Relevant Orders   CBC with Differential/Platelet   Basic metabolic panel   TSH   EKG 12-Lead    Other Visit Diagnoses    Screening, lipid        Relevant Orders    Lipid panel    High risk medication use        Relevant Orders    Hepatic function panel      Plan: Will refer to cardiology for evaluation. Warning signs reviewed. Patient to seek help immediately if any significant problems. Lipid profile pending. Strongly recommend preventive health physical. Also recommend office visit for discussion regarding fibromyalgia.

## 2015-09-26 LAB — BASIC METABOLIC PANEL
BUN/Creatinine Ratio: 23 (ref 9–23)
BUN: 15 mg/dL (ref 6–24)
CO2: 23 mmol/L (ref 18–29)
Calcium: 9 mg/dL (ref 8.7–10.2)
Chloride: 102 mmol/L (ref 97–106)
Creatinine, Ser: 0.65 mg/dL (ref 0.57–1.00)
GFR calc Af Amer: 121 mL/min/{1.73_m2} (ref >59)
GFR calc non Af Amer: 105 mL/min/{1.73_m2} (ref >59)
Glucose: 86 mg/dL (ref 65–99)
Potassium: 4.8 mmol/L (ref 3.5–5.2)
Sodium: 140 mmol/L (ref 136–144)

## 2015-09-26 LAB — HEPATIC FUNCTION PANEL
ALT: 6 IU/L (ref 0–32)
AST: 10 IU/L (ref 0–40)
Albumin: 4.4 g/dL (ref 3.5–5.5)
Alkaline Phosphatase: 75 IU/L (ref 39–117)
Bilirubin Total: 0.3 mg/dL (ref 0.0–1.2)
Bilirubin, Direct: 0.07 mg/dL (ref 0.00–0.40)
Total Protein: 6.2 g/dL (ref 6.0–8.5)

## 2015-09-26 LAB — CBC WITH DIFFERENTIAL/PLATELET
Basophils Absolute: 0 10*3/uL (ref 0.0–0.2)
Basos: 0 %
EOS (ABSOLUTE): 0.1 10*3/uL (ref 0.0–0.4)
Eos: 1 %
Hematocrit: 38.2 % (ref 34.0–46.6)
Hemoglobin: 13 g/dL (ref 11.1–15.9)
Immature Grans (Abs): 0 10*3/uL (ref 0.0–0.1)
Immature Granulocytes: 0 %
Lymphocytes Absolute: 2 10*3/uL (ref 0.7–3.1)
Lymphs: 36 %
MCH: 31.7 pg (ref 26.6–33.0)
MCHC: 34 g/dL (ref 31.5–35.7)
MCV: 93 fL (ref 79–97)
Monocytes Absolute: 0.5 10*3/uL (ref 0.1–0.9)
Monocytes: 9 %
Neutrophils Absolute: 3 10*3/uL (ref 1.4–7.0)
Neutrophils: 54 %
Platelets: 311 10*3/uL (ref 150–379)
RBC: 4.1 x10E6/uL (ref 3.77–5.28)
RDW: 12.5 % (ref 12.3–15.4)
WBC: 5.6 10*3/uL (ref 3.4–10.8)

## 2015-09-26 LAB — LIPID PANEL
Chol/HDL Ratio: 4.7 ratio units — ABNORMAL HIGH (ref 0.0–4.4)
Cholesterol, Total: 218 mg/dL — ABNORMAL HIGH (ref 100–199)
HDL: 46 mg/dL (ref >39)
LDL Calculated: 155 mg/dL — ABNORMAL HIGH (ref 0–99)
Triglycerides: 85 mg/dL (ref 0–149)
VLDL Cholesterol Cal: 17 mg/dL (ref 5–40)

## 2015-09-26 LAB — TSH: TSH: 1.62 u[IU]/mL (ref 0.450–4.500)

## 2015-09-28 ENCOUNTER — Telehealth: Payer: Self-pay | Admitting: Family Medicine

## 2015-09-28 NOTE — Telephone Encounter (Signed)
Pt would like to know the results to her labs please

## 2015-09-30 NOTE — Telephone Encounter (Signed)
See notes on labs. 

## 2015-10-07 ENCOUNTER — Ambulatory Visit (INDEPENDENT_AMBULATORY_CARE_PROVIDER_SITE_OTHER): Payer: PRIVATE HEALTH INSURANCE | Admitting: Cardiovascular Disease

## 2015-10-07 ENCOUNTER — Encounter: Payer: Self-pay | Admitting: *Deleted

## 2015-10-07 ENCOUNTER — Encounter: Payer: Self-pay | Admitting: Cardiovascular Disease

## 2015-10-07 VITALS — BP 106/68 | HR 89 | Ht 63.0 in | Wt 169.0 lb

## 2015-10-07 DIAGNOSIS — E7800 Pure hypercholesterolemia, unspecified: Secondary | ICD-10-CM

## 2015-10-07 DIAGNOSIS — R5383 Other fatigue: Secondary | ICD-10-CM | POA: Diagnosis not present

## 2015-10-07 DIAGNOSIS — R002 Palpitations: Secondary | ICD-10-CM

## 2015-10-07 DIAGNOSIS — M797 Fibromyalgia: Secondary | ICD-10-CM

## 2015-10-07 MED ORDER — ROSUVASTATIN CALCIUM 5 MG PO TABS: 5.0000 mg | ORAL_TABLET | Freq: Every day | ORAL | Status: DC

## 2015-10-07 NOTE — Patient Instructions (Signed)
Medication Instructions:  START CRESTOR 5 MG DAILY  Labwork: NONE  Testing/Procedures: Your physician has requested that you have an echocardiogram. Echocardiography is a painless test that uses sound waves to create images of your heart. It provides your doctor with information about the size and shape of your heart and how well your heart's chambers and valves are working. This procedure takes approximately one hour. There are no restrictions for this procedure.  Your physician has recommended that you wear an event monitor. Event monitors are medical devices that record the heart's electrical activity. Doctors most often us these monitors to diagnose arrhythmias. Arrhythmias are problems with the speed or rhythm of the heartbeat. The monitor is a small, portable device. You can wear one while you do your normal daily activities. This is usually used to diagnose what is causing palpitations/syncope (passing out).    Follow-Up: Your physician recommends that you schedule a follow-up appointment in: 6 WEEKS WITH DR. Purvis SheffieldKONESWARAN   Any Other Special Instructions Will Be Listed Below (If Applicable).     If you need a refill on your cardiac medications before your next appointment, please call your pharmacy. Thanks for choosing [facility]!!!

## 2015-10-07 NOTE — Progress Notes (Signed)
Patient ID: Mary [name], female   DOB: [birth year], 49 y.o.   MRN: [id number]       CARDIOLOGY CONSULT NOTE  Patient ID: Mary [name] MRN: [id number] DOB/AGE: 1966/09/15 49 y.o. y.o.  Admit date: (Not on file) Primary Physician Lubertha South, MD  Reason for Consultation: palpitations  HPI: The patient is a 49 year old woman with a history of hypothyroidism, fibromyalgia, and chronic insomnia who has been referred for the evaluation of palpitations. Upon review of notes from PCP, she has experienced these on and off for about a year. They occur daily but are worse when she is at work. She denies syncope and shortness of breath. She drinks 2 cups of coffee daily. Recent ECG performed at PCPs office demonstrated sinus rhythm with possible old anteroseptal infarct and PVCs.  She complained of palpitations during my physical exam but she was in a regular rhythm and I did not appreciate any premature contractions. She denies exertional chest pain. She sometimes has episodes where she has to take a deep breath after having palpitations. She describes it as a forceful heartbeat and a squeezing sensation. She said she does not exercise at all. She has a lot of stress in her life as one of her daughters is addicted to drugs. She is here with another daughter and her husband. She denies orthopnea, paroxysmal nocturnal dyspnea, syncope, and leg swelling. She has a Health and safety inspector job. She complains of diffuse muscle aches and tightness in her neck related to her fibromyalgia.  Fam: Father died of MI at 31, and was a smoker.  Soc: Former smoker.   No Known Allergies  Current Outpatient Prescriptions  Medication Sig Dispense Refill  . fexofenadine-pseudoephedrine (ALLEGRA-D ALLERGY & CONGESTION) 180-240 MG per 24 hr tablet Take 1 tablet by mouth daily. (Patient taking differently: Take 1 tablet by mouth daily as needed (for allergies). ) 15 tablet 0  . FLUoxetine (PROZAC) 40 MG capsule Take 40 mg by  mouth daily.    Marland Kitchen levothyroxine (SYNTHROID, LEVOTHROID) 50 MCG tablet Take 1 tablet (50 mcg total) by mouth daily before breakfast. 30 tablet 5   No current facility-administered medications for this visit.    Past Medical History  Diagnosis Date  . Fibromyalgia   . Migraine   . Thyroid disease     Past Surgical History  Procedure Laterality Date  . Tubal ligation    . Tonsillectomy    . Uterine ablation      Social History   Social History  . Marital Status: Married    Spouse Name: N/A  . Number of Children: N/A  . Years of Education: N/A   Occupational History  . Not on file.   Social History Main Topics  . Smoking status: Former Smoker    Types: Cigarettes  . Smokeless tobacco: Not on file     Comment: quit feb 2016  . Alcohol Use: No  . Drug Use: No  . Sexual Activity: Yes    Birth Control/ Protection: Surgical   Other Topics Concern  . Not on file   Social History Narrative       Prior to Admission medications   Medication Sig Start Date End Date Taking? Authorizing Provider  fexofenadine-pseudoephedrine (ALLEGRA-D ALLERGY & CONGESTION) 180-240 MG per 24 hr tablet Take 1 tablet by mouth daily. Patient taking differently: Take 1 tablet by mouth daily as needed (for allergies).  12/14/14  Yes Raeford Razor, MD  FLUoxetine (PROZAC) 40 MG capsule Take 40 mg by mouth daily.  Yes Historical Provider, MD  levothyroxine (SYNTHROID, LEVOTHROID) 50 MCG tablet Take 1 tablet (50 mcg total) by mouth daily before breakfast. 04/30/15  Yes Merlyn AlbertWilliam S Luking, MD     Review of systems complete and found to be negative unless listed above in HPI     Physical exam Blood pressure 106/68, pulse 89, height 5\' 3"  (1.6 m), weight 169 lb (76.658 kg), SpO2 97 %. General: NAD Neck: No JVD, no thyromegaly or thyroid nodule.  Lungs: Clear to auscultation bilaterally with normal respiratory effort. CV: Nondisplaced PMI. Regular rate and rhythm, normal S1/S2, no S3/S4, no  murmur.  No peripheral edema.  No carotid bruit.  Normal pedal pulses.  Abdomen: Soft, nontender, obese.  Skin: Intact without lesions or rashes.  Neurologic: Alert and oriented x 3.  Psych: Normal affect. Extremities: No clubbing or cyanosis.  HEENT: Normal.   ECG: Most recent ECG reviewed.  Labs:   Lab Results  Component Value Date   WBC 5.6 09/25/2015   HGB 12.5 12/14/2014   HCT 38.2 09/25/2015   MCV 92.1 12/14/2014   PLT 236 12/14/2014   No results for input(s): NA, K, CL, CO2, BUN, CREATININE, CALCIUM, PROT, BILITOT, ALKPHOS, ALT, AST, GLUCOSE in the last 168 hours.  Invalid input(s): LABALBU Lab Results  Component Value Date   TROPONINI <0.30 02/18/2013    Lab Results  Component Value Date   CHOL 218* 09/25/2015   Lab Results  Component Value Date   HDL 46 09/25/2015   Lab Results  Component Value Date   LDLCALC 155* 09/25/2015   Lab Results  Component Value Date   TRIG 85 09/25/2015   Lab Results  Component Value Date   CHOLHDL 4.7* 09/25/2015   No results found for: LDLDIRECT       Studies: No results found.  ASSESSMENT AND PLAN:  1. Palpitations and fatigue: Will obtain a one-week event monitor to see if she has any sustained arrhythmias and to see if she is experiencing symptomatic PVCs. Will obtain an echocardiogram to evaluate for structural heart disease. Recent labs were checked and basic metabolic panel, TSH, and CBC were all normal. Fatigue may be related to anxiety and stress related to family stressors and fibromyalgia.  2. Hyperlipidemia: Lipids on 09/25/15 showed total cholesterol 218, triglycerides 85, HDL 46, LDL 155. She is a former smoker and has a strong family h/o heart disease. For this reason, I prefer to initiate low-dose statin therapy with Crestor 5 mg and for its pleiotropic effects.  Dispo: f/u 6 weeks.   Signed: Prentice DockerSuresh Erek Kowal, M.D., F.A.C.C.  10/07/2015, 2:56 PM

## 2015-10-09 ENCOUNTER — Ambulatory Visit (HOSPITAL_COMMUNITY)
Admission: RE | Admit: 2015-10-09 | Discharge: 2015-10-09 | Disposition: A | Discharge status: Home / Self Care | Payer: No Typology Code available for payment source | Source: Ambulatory Visit | Attending: Cardiovascular Disease | Admitting: Cardiovascular Disease

## 2015-10-09 ENCOUNTER — Ambulatory Visit (HOSPITAL_COMMUNITY): Payer: PRIVATE HEALTH INSURANCE

## 2015-10-09 DIAGNOSIS — R002 Palpitations: Secondary | ICD-10-CM | POA: Insufficient documentation

## 2015-10-13 ENCOUNTER — Telehealth: Payer: Self-pay | Admitting: Family Medicine

## 2015-10-13 MED ORDER — ALPRAZOLAM 0.5 MG PO TABS: ORAL_TABLET | ORAL | Status: DC

## 2015-10-13 NOTE — Telephone Encounter (Signed)
Left message on voicemail notifying patient that script will be faxed to pharmacy by the end of the day.

## 2015-10-13 NOTE — Telephone Encounter (Signed)
Pt called stating that she was released from her job yesterday and feels like she is about to have a nervous breakdown. Pt scheduled an appt with Dr. Brett CanalesSteve on Friday. Pt would like for something to be called in.      [facility] [place]

## 2015-10-13 NOTE — Telephone Encounter (Signed)
How many can patient have

## 2015-10-13 NOTE — Telephone Encounter (Signed)
Xanax .5 mg one up to bid prn anxiety no ref, drowsy prec

## 2015-10-13 NOTE — Telephone Encounter (Signed)
24

## 2015-10-16 ENCOUNTER — Encounter: Payer: Self-pay | Admitting: Family Medicine

## 2015-10-16 ENCOUNTER — Ambulatory Visit (INDEPENDENT_AMBULATORY_CARE_PROVIDER_SITE_OTHER): Payer: PRIVATE HEALTH INSURANCE | Admitting: Family Medicine

## 2015-10-16 VITALS — BP 128/84 | Ht 63.0 in | Wt 169.0 lb

## 2015-10-16 DIAGNOSIS — F4323 Adjustment disorder with mixed anxiety and depressed mood: Secondary | ICD-10-CM | POA: Diagnosis not present

## 2015-10-16 NOTE — Progress Notes (Signed)
   Subjective:    Patient ID: Mary [name], female    DOB: 05-14-1966, 49 y.o.   MRN: [id number] Patient arrives office for protracted discussion.   Depression        This is a chronic problem.  The current episode started more than 1 year ago.   Associated symptoms include decreased concentration, fatigue, insomnia, decreased interest, appetite change and body aches.     The symptoms are aggravated by work stress (pt states she got fired this week. ).  Xanax was sent into pharm on 12/20. Pt has not picked up yet. States she will get today. Feeling very anxious.  No suicidal thoughts. Lost her job earlier this week. 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 Unfortunately feels that her employer did not respect her opinion on this matter.  Patient had worked at this current site of employment for 3 years. Feels she was treated unfairly. Very stressed out.  Continued use to [facility]. Saw the counselor this week. In addition saw the psychiatrist this week. They recommended adding Adderall to her current regimen.   Review of Systems  Constitutional: Positive for appetite change and fatigue.  Psychiatric/Behavioral: Positive for depression and decreased concentration. The patient has insomnia.        Objective:   Physical Exam  Alert no acute distress. Lungs clear. Heart regular in rhythm. H&T normal. Anxious appearing tearful at times      Assessment & Plan:  Impression flare of depression and anxiety with acute job loss and considerable stress. Long discussion held plan patient encouraged to wait before changing her mental health provider. At the same time I emphasized the patient her need for ongoing mental health specialist we cannot possibly take over her anxiety and depression at this time [facility]

## 2015-10-20 ENCOUNTER — Ambulatory Visit: Payer: Self-pay | Admitting: Nurse Practitioner

## 2015-11-25 ENCOUNTER — Ambulatory Visit: Payer: Self-pay | Admitting: Cardiovascular Disease

## 2016-05-02 ENCOUNTER — Other Ambulatory Visit: Payer: Self-pay | Admitting: Family Medicine

## 2016-05-04 DIAGNOSIS — Z029 Encounter for administrative examinations, unspecified: Secondary | ICD-10-CM

## 2016-07-19 ENCOUNTER — Other Ambulatory Visit: Payer: Self-pay | Admitting: Family Medicine

## 2016-08-28 ENCOUNTER — Other Ambulatory Visit: Payer: Self-pay | Admitting: Family Medicine

## 2016-08-30 ENCOUNTER — Emergency Department (HOSPITAL_COMMUNITY): Payer: Self-pay

## 2016-08-30 ENCOUNTER — Emergency Department (HOSPITAL_COMMUNITY)
Admission: EM | Admit: 2016-08-30 | Discharge: 2016-08-30 | Disposition: A | Discharge status: Home / Self Care | Payer: Self-pay | Attending: Emergency Medicine | Admitting: Emergency Medicine

## 2016-08-30 ENCOUNTER — Encounter (HOSPITAL_COMMUNITY): Payer: Self-pay | Admitting: Emergency Medicine

## 2016-08-30 DIAGNOSIS — R51 Headache: Secondary | ICD-10-CM | POA: Insufficient documentation

## 2016-08-30 DIAGNOSIS — F17210 Nicotine dependence, cigarettes, uncomplicated: Secondary | ICD-10-CM | POA: Insufficient documentation

## 2016-08-30 DIAGNOSIS — R42 Dizziness and giddiness: Secondary | ICD-10-CM | POA: Insufficient documentation

## 2016-08-30 DIAGNOSIS — R519 Headache, unspecified: Secondary | ICD-10-CM

## 2016-08-30 LAB — URINALYSIS, ROUTINE W REFLEX MICROSCOPIC
Bilirubin Urine: NEGATIVE
Glucose, UA: NEGATIVE mg/dL
Ketones, ur: NEGATIVE mg/dL
Nitrite: NEGATIVE
Protein, ur: NEGATIVE mg/dL
Specific Gravity, Urine: 1.015 (ref 1.005–1.030)
pH: 6 (ref 5.0–8.0)

## 2016-08-30 LAB — BASIC METABOLIC PANEL
Anion gap: 6 (ref 5–15)
BUN: 18 mg/dL (ref 6–20)
CO2: 28 mmol/L (ref 22–32)
Calcium: 9.4 mg/dL (ref 8.9–10.3)
Chloride: 103 mmol/L (ref 101–111)
Creatinine, Ser: 0.76 mg/dL (ref 0.44–1.00)
GFR calc Af Amer: >60 mL/min (ref >60)
GFR calc non Af Amer: >60 mL/min (ref >60)
Glucose, Bld: 99 mg/dL (ref 65–99)
Potassium: 3.5 mmol/L (ref 3.5–5.1)
Sodium: 137 mmol/L (ref 135–145)

## 2016-08-30 LAB — CBC WITH DIFFERENTIAL/PLATELET
Basophils Absolute: 0 10*3/uL (ref 0.0–0.1)
Basophils Relative: 0 %
Eosinophils Absolute: 0.2 10*3/uL (ref 0.0–0.7)
Eosinophils Relative: 2 %
HCT: 39.6 % (ref 36.0–46.0)
Hemoglobin: 13.7 g/dL (ref 12.0–15.0)
Lymphocytes Relative: 49 %
Lymphs Abs: 4.2 10*3/uL — ABNORMAL HIGH (ref 0.7–4.0)
MCH: 32.4 pg (ref 26.0–34.0)
MCHC: 34.6 g/dL (ref 30.0–36.0)
MCV: 93.6 fL (ref 78.0–100.0)
Monocytes Absolute: 0.7 10*3/uL (ref 0.1–1.0)
Monocytes Relative: 8 %
Neutro Abs: 3.6 10*3/uL (ref 1.7–7.7)
Neutrophils Relative %: 41 %
Platelets: 309 10*3/uL (ref 150–400)
RBC: 4.23 MIL/uL (ref 3.87–5.11)
RDW: 11.9 % (ref 11.5–15.5)
WBC: 8.8 10*3/uL (ref 4.0–10.5)

## 2016-08-30 LAB — URINE MICROSCOPIC-ADD ON

## 2016-08-30 LAB — I-STAT TROPONIN, ED: Troponin i, poc: 0 ng/mL (ref 0.00–0.08)

## 2016-08-30 LAB — PREGNANCY, URINE: Preg Test, Ur: NEGATIVE

## 2016-08-30 MED ORDER — METOCLOPRAMIDE HCL 5 MG/ML IJ SOLN
10.0000 mg | INTRAMUSCULAR | Status: AC
  Administered 2016-08-30: 10 mg via INTRAVENOUS
  Filled 2016-08-30: qty 2

## 2016-08-30 MED ORDER — SODIUM CHLORIDE 0.9 % IV BOLUS (SEPSIS)
1000.0000 mL | Freq: Once | INTRAVENOUS | Status: AC
  Administered 2016-08-30: 1000 mL via INTRAVENOUS

## 2016-08-30 MED ORDER — ACETAMINOPHEN 325 MG PO TABS: 650.0000 mg | ORAL_TABLET | Freq: Four times a day (QID) | ORAL | 0 refills | Status: DC | PRN

## 2016-08-30 MED ORDER — DIPHENHYDRAMINE HCL 50 MG/ML IJ SOLN
25.0000 mg | Freq: Once | INTRAMUSCULAR | Status: AC
  Administered 2016-08-30: 25 mg via INTRAVENOUS
  Filled 2016-08-30: qty 1

## 2016-08-30 NOTE — ED Provider Notes (Signed)
[facility]-EMER[facility]ENCY DEPT Provider Note   CSN: [id number] Arrival date & time: 08/30/16  2124     History   Chief Complaint Chief Complaint  Patient presents with  . Dizziness    HPI Mary [name] is a 50 y.o. female.  Mary [name] is a 50 y.o. Female who presents to the ED complaining of not feeling right for the past 3 days. Patient reports she began feeling lightheaded 3 days ago and that her eyes felt heavy and it was hard to see driving at night. She denies room spinning dizziness or worsening symptoms with position change or head movement.  She reports this morning she woke up with a posterior headache and left shoulder pain. She reports taking her blood pressure and noted it to be higher for her. She reports some increased palpitations intermittently for the past 3 days, but none currently. She reports she does not feel herself. Nothing for treatment today. She denies fevers, double vision, head injury, neck pain, ear pain, numbness, tingling, weakness, abdominal pain, nausea, vomiting, diarrhea, urinary symptoms, chest pain, shortness of breath, leg pain, leg swelling, or rashes.    The history is provided by the patient. No language interpreter was used.  Dizziness  Associated symptoms: headaches and palpitations   Associated symptoms: no chest pain, no diarrhea, no nausea, no shortness of breath, no vomiting and no weakness     Past Medical History:  Diagnosis Date  . Fibromyalgia   . Migraine   . Thyroid disease     Patient Active Problem List   Diagnosis Date Noted  . Palpitations 09/25/2015  . Adjustment disorder with mixed anxiety and depressed mood 08/03/2014  . Fibromyalgia affecting multiple sites 05/28/2014    Past Surgical History:  Procedure Laterality Date  . TONSILLECTOMY    . TUBAL LIGATION    . uterine ablation      OB History    No data available       Home Medications    Prior to Admission medications   Medication Sig Start  Date End Date Taking? Authorizing Provider  acetaminophen (TYLENOL) 325 MG tablet Take 2 tablets (650 mg total) by mouth every 6 (six) hours as needed for mild pain, moderate pain or headache. 08/30/16   Everlene Farrier, PA-C  ALPRAZolam Prudy Feeler) 0.5 MG tablet Take one up to BID PRN anxiety Patient not taking: Reported on 10/16/2015 10/13/15   Merlyn Albert, MD  fexofenadine-pseudoephedrine (ALLEGRA-D ALLERGY & CONGESTION) 180-240 MG per 24 hr tablet Take 1 tablet by mouth daily. Patient taking differently: Take 1 tablet by mouth daily as needed (for allergies).  12/14/14   Raeford Razor, MD  FLUoxetine (PROZAC) 40 MG capsule Take 40 mg by mouth daily.    Historical Provider, MD  levothyroxine (SYNTHROID, LEVOTHROID) 50 MCG tablet TAKE ONE TABLET BY MOUTH ONCE DAILY BEFORE BREAKFAST 08/29/16   Merlyn Albert, MD  rosuvastatin (CRESTOR) 5 MG tablet Take 1 tablet (5 mg total) by mouth daily. Patient not taking: Reported on 10/16/2015 10/07/15   Laqueta Linden, MD    Family History Family History  Problem Relation Age of Onset  . Heart disease Mother   . Heart disease Father     Social History Social History  Substance Use Topics  . Smoking status: Current Every Day Smoker    Types: Cigarettes  . Smokeless tobacco: Never Used     Comment: quit feb 2016  . Alcohol use No     Allergies  Patient has no known allergies.   Review of Systems Review of Systems  Constitutional: Negative for chills and fever.  HENT: Negative for congestion, ear pain and sore throat.   Eyes: Negative for photophobia and visual disturbance.  Respiratory: Negative for cough, shortness of breath and wheezing.   Cardiovascular: Positive for palpitations. Negative for chest pain and leg swelling.  Gastrointestinal: Negative for abdominal pain, diarrhea, nausea and vomiting.  Genitourinary: Negative for difficulty urinating, dysuria and frequency.  Musculoskeletal: Positive for arthralgias. Negative for  back pain and neck pain.  Skin: Negative for rash.  Neurological: Positive for light-headedness and headaches. Negative for dizziness, syncope, speech difficulty, weakness and numbness.     Physical Exam Updated Vital Signs BP 102/63   Pulse (!) 42   Temp 97.6 F (36.4 C) (Oral)   Resp 21   Ht 5\' 4"  (1.626 m)   Wt 72.6 kg   SpO2 99%   BMI 27.46 kg/m   Physical Exam  Constitutional: She is oriented to person, place, and time. She appears well-developed and well-nourished. No distress.  Nontoxic appearing.  HENT:  Head: Normocephalic and atraumatic.  Right Ear: External ear normal.  Left Ear: External ear normal.  Mouth/Throat: Oropharynx is clear and moist.  Bilateral tympanic membranes are pearly-gray without erythema or loss of landmarks.  No temporal edema or tenderness.   Eyes: Conjunctivae and EOM are normal. Pupils are equal, round, and reactive to light. Right eye exhibits no discharge. Left eye exhibits no discharge.  Neck: Normal range of motion. Neck supple. No JVD present.  Cardiovascular: Normal rate, regular rhythm, normal heart sounds and intact distal pulses.  Exam reveals no gallop and no friction rub.   No murmur heard. Bilateral radial pulses are intact.   Pulmonary/Chest: Effort normal and breath sounds normal. No stridor. No respiratory distress. She has no wheezes. She has no rales.  Abdominal: Soft. There is no tenderness. There is no guarding.  Musculoskeletal: Normal range of motion. She exhibits no edema or tenderness.  No lower extremity edema or tenderness.  Lymphadenopathy:    She has no cervical adenopathy.  Neurological: She is alert and oriented to person, place, and time. No cranial nerve deficit. Coordination normal.  Patient is alert and oriented 3. Cranial nerves are intact. Speech is clear and coherent. EOMs are intact. Vision is grossly intact. Normal gait. Sensation is intact her bilateral upper and lower extremities.  Skin: Skin is warm  and dry. Capillary refill takes less than 2 seconds. No rash noted. She is not diaphoretic. No erythema. No pallor.  Psychiatric: Her behavior is normal.  Patient is tearful initially when I walk in the room.   Nursing note and vitals reviewed.    ED Treatments / Results  Labs (all labs ordered are listed, but only abnormal results are displayed) Labs Reviewed  CBC WITH DIFFERENTIAL/PLATELET - Abnormal; Notable for the following:       Result Value   Lymphs Abs 4.2 (*)    All other components within normal limits  URINALYSIS, ROUTINE W REFLEX MICROSCOPIC (NOT AT [facility]) - Abnormal; Notable for the following:    Hgb urine dipstick MODERATE (*)    Leukocytes, UA MODERATE (*)    All other components within normal limits  URINE MICROSCOPIC-ADD ON - Abnormal; Notable for the following:    Squamous Epithelial / LPF 0-5 (*)    Bacteria, UA RARE (*)    All other components within normal limits  BASIC METABOLIC PANEL  PREGNANCY,  URINE  I-STAT TROPOININ, ED    EKG  EKG Interpretation  Date/Time:  Tuesday August 30 2016 21:52:52 EST Ventricular Rate:  78 PR Interval:    QRS Duration: 80 QT Interval:  391 QTC Calculation: 446 R Axis:   86 Text Interpretation:  Sinus rhythm since last tracing no significant change Confirmed by Effie ShyWENTZ  MD, ELLIOTT [phone]([id number]) on 08/30/2016 10:15:00 PM       Radiology Dg Chest 2 View  Result Date: 08/30/2016 CLINICAL DATA:  50 y/o F; dizziness, weakness, jaw pain, and elevated blood pressure. EXAM: CHEST  2 VIEW COMPARISON:  07/08/2011 chest radiograph FINDINGS: The heart size and mediastinal contours are within normal limits and stable. Both lungs are clear. The visualized skeletal structures are unremarkable. IMPRESSION: No active cardiopulmonary disease. Electronically Signed   By: Mitzi HansenLance  Furusawa-Stratton M.D.   On: 08/30/2016 22:38   Ct Head Wo Contrast  Result Date: 08/30/2016 CLINICAL DATA:  Headache for 2-3 days with hypertension and dizziness  EXAM: CT HEAD WITHOUT CONTRAST TECHNIQUE: Contiguous axial images were obtained from the base of the skull through the vertex without intravenous contrast. COMPARISON:  01/03/2009 FINDINGS: BRAIN: The ventricles and sulci are normal. No intraparenchymal hemorrhage, mass effect nor midline shift. No acute large vascular territory infarcts. No abnormal extra-axial fluid collections. Basal cisterns are patent. VASCULAR: Unremarkable. SKULL/SOFT TISSUES: No skull fracture. No significant soft tissue swelling. ORBITS/SINUSES: The included ocular globes and orbital contents are normal.The mastoid aircells and included paranasal sinuses are well-aerated. OTHER: None. IMPRESSION: Normal head CT Electronically Signed   By: Tollie Ethavid  Kwon M.D.   On: 08/30/2016 22:42    Procedures Procedures (including critical care time)  Medications Ordered in ED Medications  sodium chloride 0.9 % bolus 1,000 mL (0 mLs Intravenous Stopped 08/30/16 2316)  metoCLOPramide (REGLAN) injection 10 mg (10 mg Intravenous Given 08/30/16 2207)  diphenhydrAMINE (BENADRYL) injection 25 mg (25 mg Intravenous Given 08/30/16 2208)     Initial Impression / Assessment and Plan / ED Course  I have reviewed the triage vital signs and the nursing notes.  Pertinent labs & imaging results that were available during my care of the patient were reviewed by me and considered in my medical decision making (see chart for details).  Clinical Course    This is a 50 y.o. Female who presents to the ED complaining of not feeling right for the past 3 days. Patient reports she began feeling lightheaded 3 days ago and that her eyes felt heavy and it was hard to see driving at night. She denies room spinning dizziness or worsening symptoms with position change or head movement.  She reports this morning she woke up with a posterior headache and left shoulder pain. She reports taking her blood pressure and noted it to be higher for her. She reports some increased  palpitations intermittently for the past 3 days, but none currently. She reports she does not feel herself. Nothing for treatment today. She denies fevers, double vision, chest pain, shortness of breath, numbness, tingling or weakness.  On exam the patient is afebrile nontoxic appearing. She has no focal neurological deficits. She is able to ambulate with normal gait. Lungs CTA bilaterally. Blood pressure on arrival is 145/91. Troponin is not elevated. BMP is within normal limits. CBC is unremarkable. Urinalysis is nitrite negative with moderate leukocytes. Patient denies any urinary symptoms. CT head and chest x-ray are both unremarkable. EKG shows no changes from her last tracing. I I obtained some cardiac workup as the  patient complained of some left shoulder pain. She denied any chest pain or shortness of breath. Cardiac workup appears unremarkable. She's been having this shoulder pain since early this morning. I see no need for a delta troponin at this time. She is PERC and Well's criteria negative.  At reevaluation after patient received a migraine cocktail she reports her symptoms have completely resolved. Headache has resolved. She no longer feels lightheaded. She feels much better and is very thankful. She feels ready for discharge. Possibly a migraine headache causing these symptoms. I encouraged her to follow-up with her primary care doctor to have her blood pressure rechecked and her thyroid hormones checked. Blood pressure prior to discharge is 102/63. I advised the patient to follow-up with their primary care provider this week. I advised the patient to return to the emergency department with new or worsening symptoms or new concerns. The patient verbalized understanding and agreement with plan.     Final Clinical Impressions(s) / ED Diagnoses   Final diagnoses:  Bad headache  Lightheadedness    New Prescriptions New Prescriptions   ACETAMINOPHEN (TYLENOL) 325 MG TABLET    Take 2 tablets  (650 mg total) by mouth every 6 (six) hours as needed for mild pain, moderate pain or headache.     Everlene FarrierWilliam Jshaun Abernathy, PA-C 08/30/16 2329    Mancel BaleElliott Wentz, MD 08/31/16 [phone]

## 2016-08-30 NOTE — ED Triage Notes (Signed)
Pt states she has been having dizziness since Sunday and blood pressure has been high.

## 2016-08-30 NOTE — ED Notes (Signed)
Pt to XRAY

## 2016-10-05 ENCOUNTER — Other Ambulatory Visit: Payer: Self-pay | Admitting: Family Medicine

## 2016-11-17 ENCOUNTER — Other Ambulatory Visit: Payer: Self-pay | Admitting: Family Medicine

## 2016-11-28 ENCOUNTER — Ambulatory Visit: Payer: PRIVATE HEALTH INSURANCE | Admitting: Nurse Practitioner

## 2016-11-29 ENCOUNTER — Ambulatory Visit (INDEPENDENT_AMBULATORY_CARE_PROVIDER_SITE_OTHER): Payer: 59 | Admitting: Family Medicine

## 2016-11-29 ENCOUNTER — Encounter: Payer: Self-pay | Admitting: Family Medicine

## 2016-11-29 VITALS — BP 112/72 | Ht 63.0 in | Wt 162.4 lb

## 2016-11-29 DIAGNOSIS — E7800 Pure hypercholesterolemia, unspecified: Secondary | ICD-10-CM

## 2016-11-29 DIAGNOSIS — Z13220 Encounter for screening for lipoid disorders: Secondary | ICD-10-CM

## 2016-11-29 DIAGNOSIS — Z79899 Other long term (current) drug therapy: Secondary | ICD-10-CM

## 2016-11-29 DIAGNOSIS — J329 Chronic sinusitis, unspecified: Secondary | ICD-10-CM | POA: Diagnosis not present

## 2016-11-29 DIAGNOSIS — E039 Hypothyroidism, unspecified: Secondary | ICD-10-CM

## 2016-11-29 DIAGNOSIS — J310 Chronic rhinitis: Secondary | ICD-10-CM

## 2016-11-29 MED ORDER — AMOXICILLIN-POT CLAVULANATE 875-125 MG PO TABS: 1.0000 | ORAL_TABLET | Freq: Two times a day (BID) | ORAL | 0 refills | Status: AC

## 2016-11-29 MED ORDER — LEVOTHYROXINE SODIUM 50 MCG PO TABS: ORAL_TABLET | ORAL | 11 refills | Status: DC

## 2016-11-29 NOTE — Progress Notes (Signed)
   Subjective:    Patient ID: Mary [name], female    DOB: 06-Aug-1966, 51 y.o.   MRN: [id number] Patient arrives office with 3 distinct concerns HPI Patient arrives for a follow up on thyroid.often feels cold and running tired these days, no b w sicne 2016. Patient claims compliance with thyroid medication now. Generally does not miss a dose. No noticeable side effects. As noted Cold and Tired.  Still smoking   Patient also has sinus issues with green drainage.Green discharge and for two weeks  Frontal headache left side worse with certain motions sharp at times also deep pressure at times.  Strong fam hx of cad, fa died of mi in his fifties, patient has a serious issue with hyperlipidemia. Medication was recommended and in the past. Patient declined. Very strong family history of heart disease within the family. Also complicated by smoking     Review of Systems Alert vitals stable, NAD. Blood pressure good on repeat. HEENT normal. Lungs clear. Heart regular rate and rhythm.     Objective:   Physical Exam Alert slight malaise. Vitals stable H&T moderate nasal congestion frontal tenderness pharynx normal neck supple. Lungs clear. Heart regular in rhythm. Thyroid nonpalpable.       Assessment & Plan:  Impression 1 hypothyroidism status uncertain discuss compliance discussed #2 rhinosinusitis progressive over the past couple weeks. Unresponsive to over-the-counter meds #3 hyperlipidemia serious issue for patient. Thus far not acted upon. Admits to dietary minimal compliance. Numerous risk factors discussed. Patient agrees to trial medicine of number still quite high plan 25 minutes spent greater than 50% in discussion and counseling. Antibiotics prescribed. Thyroid medicine prescribed. Appropriate blood work. Further recommendations based on results [facility]

## 2016-11-30 LAB — HEPATIC FUNCTION PANEL
ALT: 23 IU/L (ref 0–32)
AST: 16 IU/L (ref 0–40)
Albumin: 4.8 g/dL (ref 3.5–5.5)
Alkaline Phosphatase: 84 IU/L (ref 39–117)
Bilirubin Total: 0.2 mg/dL (ref 0.0–1.2)
Bilirubin, Direct: 0.08 mg/dL (ref 0.00–0.40)
Total Protein: 7.2 g/dL (ref 6.0–8.5)

## 2016-11-30 LAB — BASIC METABOLIC PANEL
BUN/Creatinine Ratio: 12 (ref 9–23)
BUN: 9 mg/dL (ref 6–24)
CO2: 25 mmol/L (ref 18–29)
Calcium: 9.2 mg/dL (ref 8.7–10.2)
Chloride: 103 mmol/L (ref 96–106)
Creatinine, Ser: 0.75 mg/dL (ref 0.57–1.00)
GFR calc Af Amer: 108 mL/min/{1.73_m2} (ref >59)
GFR calc non Af Amer: 94 mL/min/{1.73_m2} (ref >59)
Glucose: 92 mg/dL (ref 65–99)
Potassium: 4.8 mmol/L (ref 3.5–5.2)
Sodium: 144 mmol/L (ref 134–144)

## 2016-11-30 LAB — LIPID PANEL
Chol/HDL Ratio: 4.8 ratio units — ABNORMAL HIGH (ref 0.0–4.4)
Cholesterol, Total: 236 mg/dL — ABNORMAL HIGH (ref 100–199)
HDL: 49 mg/dL (ref >39)
LDL Calculated: 163 mg/dL — ABNORMAL HIGH (ref 0–99)
Triglycerides: 119 mg/dL (ref 0–149)
VLDL Cholesterol Cal: 24 mg/dL (ref 5–40)

## 2016-11-30 LAB — TSH: TSH: 1.15 u[IU]/mL (ref 0.450–4.500)

## 2016-12-02 ENCOUNTER — Telehealth: Payer: Self-pay | Admitting: Family Medicine

## 2016-12-02 NOTE — Telephone Encounter (Signed)
Requesting results to blood work. °

## 2016-12-04 NOTE — Telephone Encounter (Signed)
Message thru lab results

## 2016-12-05 ENCOUNTER — Other Ambulatory Visit: Payer: Self-pay | Admitting: Family Medicine

## 2016-12-05 DIAGNOSIS — Z1231 Encounter for screening mammogram for malignant neoplasm of breast: Secondary | ICD-10-CM

## 2016-12-05 NOTE — Telephone Encounter (Signed)
See message on labs-result note per Dr Brett CanalesSteve

## 2016-12-06 MED ORDER — ATORVASTATIN CALCIUM 20 MG PO TABS: 20.0000 mg | ORAL_TABLET | Freq: Every day | ORAL | 2 refills | Status: DC

## 2016-12-06 NOTE — Addendum Note (Signed)
Addended by: Margaretha SheffieldBROWN, AUTUMN S on: 12/06/2016 09:22 AM   Modules accepted: Orders

## 2016-12-10 IMAGING — DX DG CHEST 2V
2 series · 2 of 2 positions shown · non-contrast
Comparison: 07/08/2011 chest radiograph

CLINICAL DATA: 50 y/o F; dizziness, weakness, jaw pain, and
elevated blood pressure.

EXAM:
CHEST  2 VIEW

[chest lat]
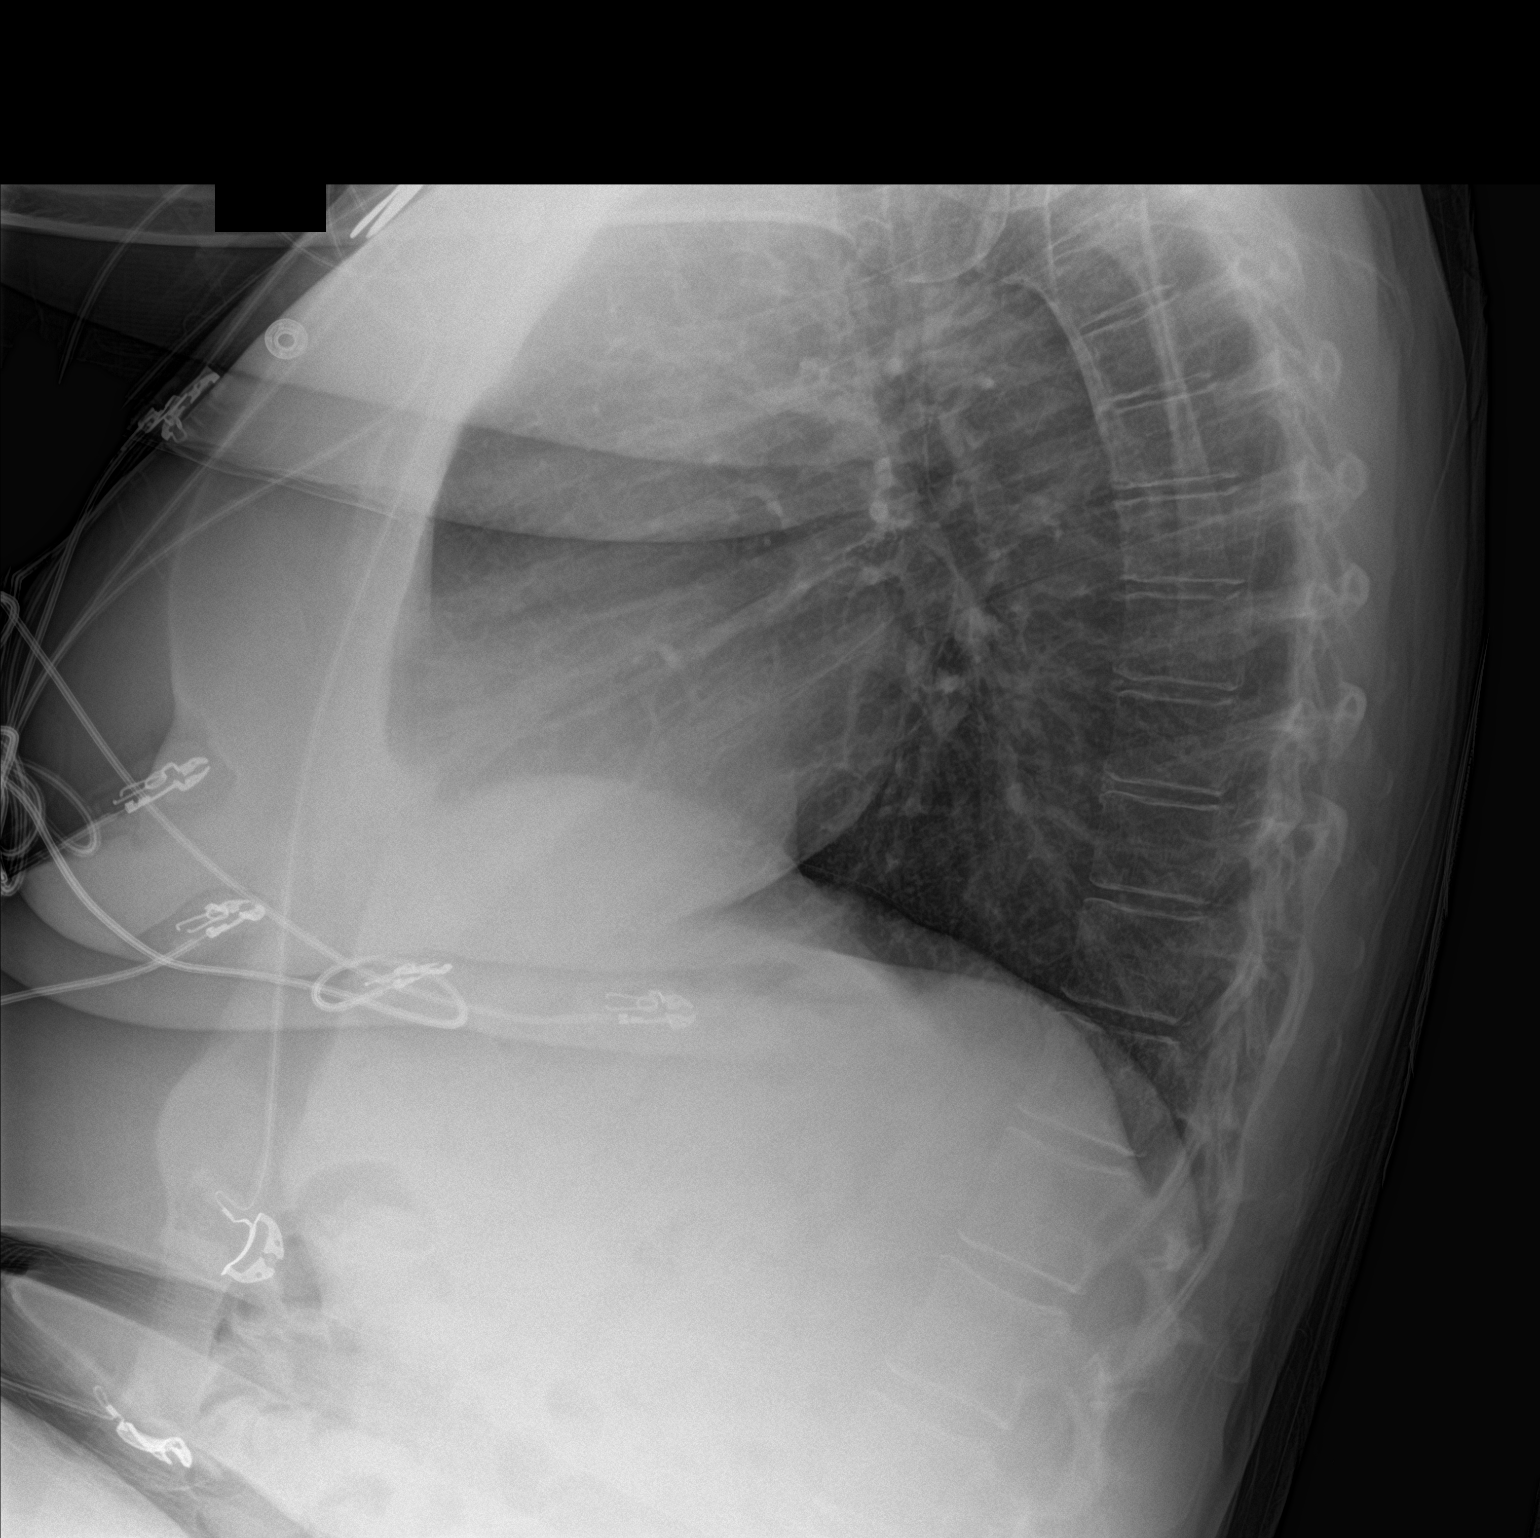

[chest ap]
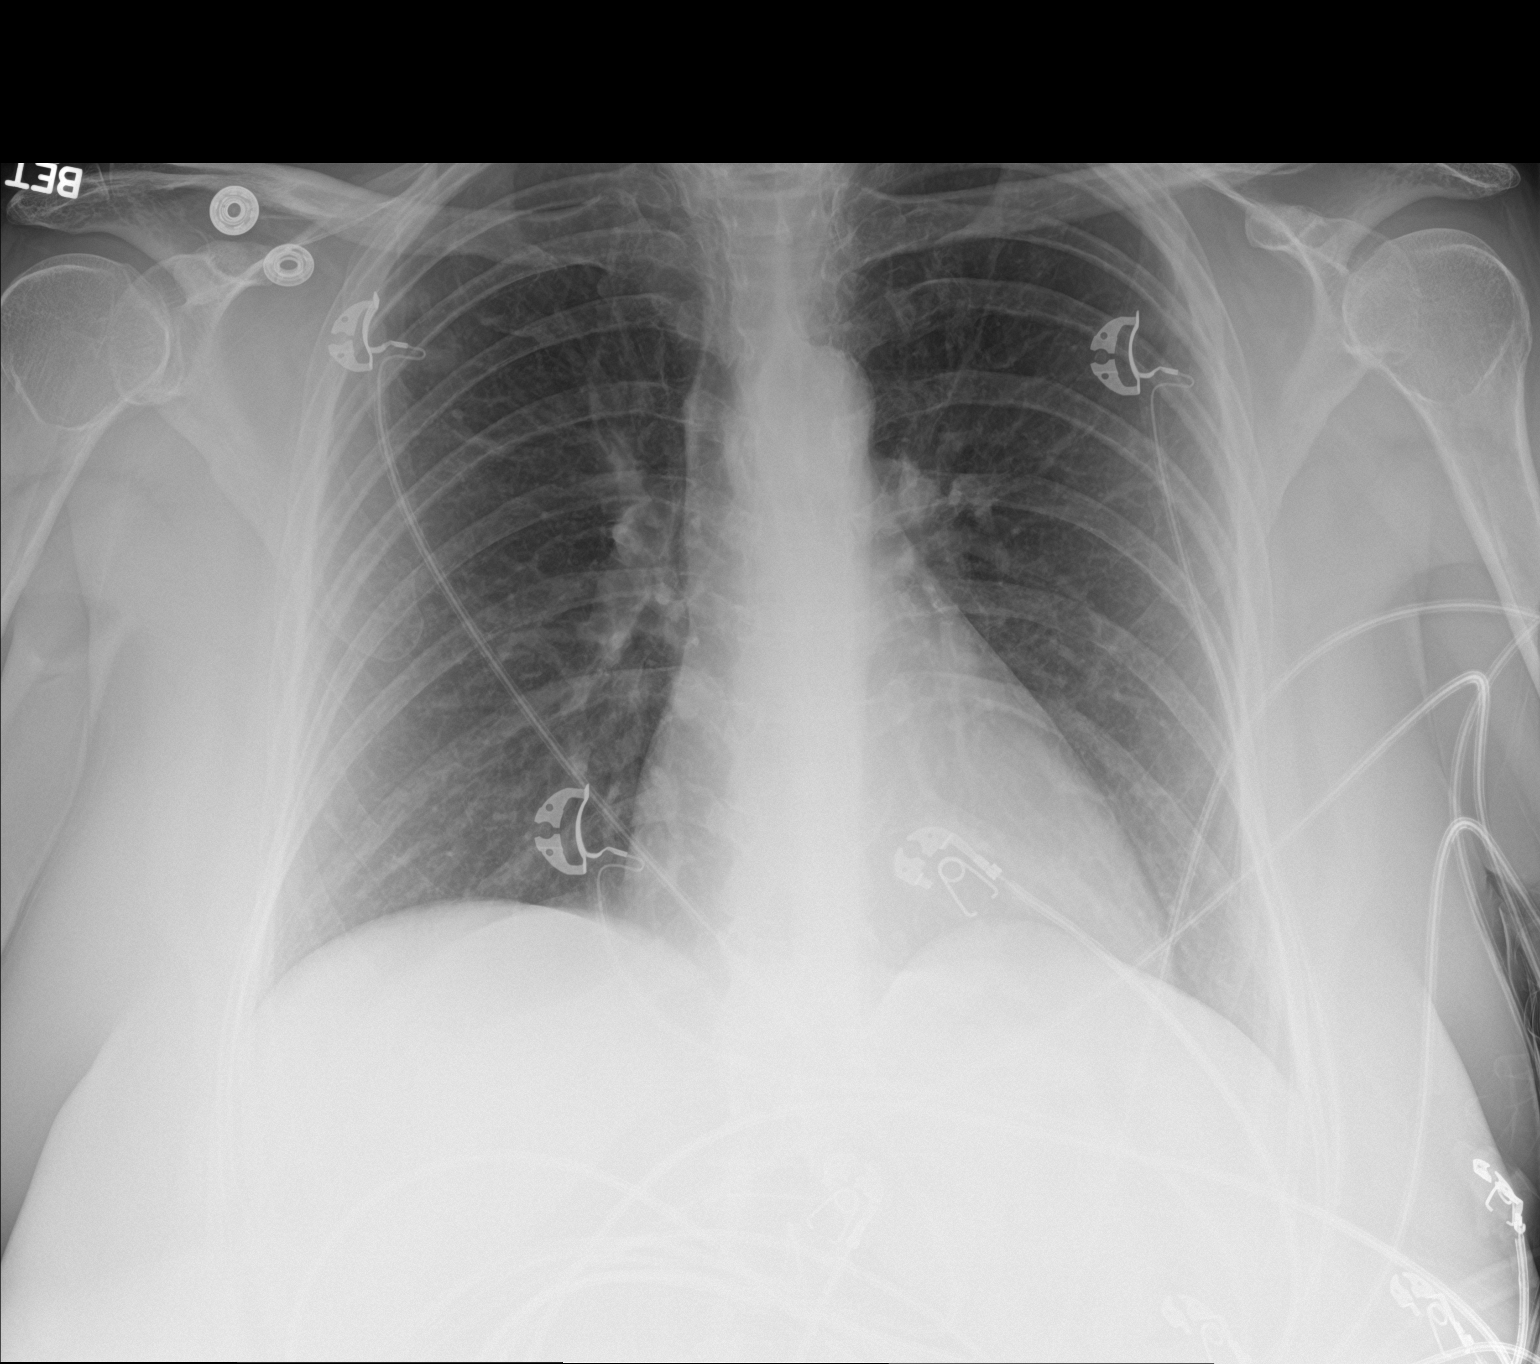

[2 of 2 positions shown; findings below may reference images not displayed]

FINDINGS: The heart size and mediastinal contours are within normal limits and
stable. Both lungs are clear. The visualized skeletal structures are
unremarkable.
IMPRESSION: No active cardiopulmonary disease.

By: Hima Fei M.D.

## 2016-12-12 ENCOUNTER — Ambulatory Visit (HOSPITAL_COMMUNITY)
Admission: RE | Admit: 2016-12-12 | Discharge: 2016-12-12 | Disposition: A | Discharge status: Home / Self Care | Payer: 59 | Source: Ambulatory Visit | Attending: Family Medicine | Admitting: Family Medicine

## 2016-12-12 DIAGNOSIS — Z1231 Encounter for screening mammogram for malignant neoplasm of breast: Secondary | ICD-10-CM | POA: Diagnosis present

## 2016-12-20 ENCOUNTER — Ambulatory Visit: Payer: 59 | Admitting: Family Medicine

## 2016-12-20 ENCOUNTER — Emergency Department (HOSPITAL_COMMUNITY): Payer: 59

## 2016-12-20 ENCOUNTER — Emergency Department (HOSPITAL_COMMUNITY)
Admission: EM | Admit: 2016-12-20 | Discharge: 2016-12-20 | Disposition: A | Discharge status: Home / Self Care | Payer: 59 | Attending: Emergency Medicine | Admitting: Emergency Medicine

## 2016-12-20 ENCOUNTER — Encounter (HOSPITAL_COMMUNITY): Payer: Self-pay | Admitting: *Deleted

## 2016-12-20 DIAGNOSIS — F17210 Nicotine dependence, cigarettes, uncomplicated: Secondary | ICD-10-CM | POA: Insufficient documentation

## 2016-12-20 DIAGNOSIS — R109 Unspecified abdominal pain: Secondary | ICD-10-CM

## 2016-12-20 DIAGNOSIS — Z79899 Other long term (current) drug therapy: Secondary | ICD-10-CM | POA: Diagnosis not present

## 2016-12-20 LAB — URINALYSIS, ROUTINE W REFLEX MICROSCOPIC
Bilirubin Urine: NEGATIVE
Glucose, UA: NEGATIVE mg/dL
Ketones, ur: NEGATIVE mg/dL
Nitrite: NEGATIVE
Protein, ur: NEGATIVE mg/dL
Specific Gravity, Urine: 1.003 — ABNORMAL LOW (ref 1.005–1.030)
pH: 8 (ref 5.0–8.0)

## 2016-12-20 LAB — COMPREHENSIVE METABOLIC PANEL
ALT: 20 U/L (ref 14–54)
AST: 17 U/L (ref 15–41)
Albumin: 4.1 g/dL (ref 3.5–5.0)
Alkaline Phosphatase: 77 U/L (ref 38–126)
Anion gap: 8 (ref 5–15)
BUN: 8 mg/dL (ref 6–20)
CO2: 27 mmol/L (ref 22–32)
Calcium: 8.7 mg/dL — ABNORMAL LOW (ref 8.9–10.3)
Chloride: 103 mmol/L (ref 101–111)
Creatinine, Ser: 0.71 mg/dL (ref 0.44–1.00)
GFR calc Af Amer: >60 mL/min (ref >60)
GFR calc non Af Amer: >60 mL/min (ref >60)
Glucose, Bld: 87 mg/dL (ref 65–99)
Potassium: 3.8 mmol/L (ref 3.5–5.1)
Sodium: 138 mmol/L (ref 135–145)
Total Bilirubin: 0.4 mg/dL (ref 0.3–1.2)
Total Protein: 7.1 g/dL (ref 6.5–8.1)

## 2016-12-20 LAB — CBC WITH DIFFERENTIAL/PLATELET
Basophils Absolute: 0 10*3/uL (ref 0.0–0.1)
Basophils Relative: 0 %
Eosinophils Absolute: 0.1 10*3/uL (ref 0.0–0.7)
Eosinophils Relative: 1 %
HCT: 38.3 % (ref 36.0–46.0)
Hemoglobin: 13.2 g/dL (ref 12.0–15.0)
Lymphocytes Relative: 24 %
Lymphs Abs: 2.6 10*3/uL (ref 0.7–4.0)
MCH: 32.1 pg (ref 26.0–34.0)
MCHC: 34.5 g/dL (ref 30.0–36.0)
MCV: 93.2 fL (ref 78.0–100.0)
Monocytes Absolute: 0.7 10*3/uL (ref 0.1–1.0)
Monocytes Relative: 7 %
Neutro Abs: 7.2 10*3/uL (ref 1.7–7.7)
Neutrophils Relative %: 68 %
Platelets: 305 10*3/uL (ref 150–400)
RBC: 4.11 MIL/uL (ref 3.87–5.11)
RDW: 11.7 % (ref 11.5–15.5)
WBC: 10.7 10*3/uL — ABNORMAL HIGH (ref 4.0–10.5)

## 2016-12-20 LAB — I-STAT BETA HCG BLOOD, ED (MC, WL, AP ONLY): I-stat hCG, quantitative: <5 m[IU]/mL (ref <5)

## 2016-12-20 MED ORDER — IBUPROFEN 800 MG PO TABS: 800.0000 mg | ORAL_TABLET | Freq: Three times a day (TID) | ORAL | 0 refills | Status: DC

## 2016-12-20 MED ORDER — ONDANSETRON HCL 4 MG/2ML IJ SOLN
4.0000 mg | Freq: Once | INTRAMUSCULAR | Status: AC
  Administered 2016-12-20: 4 mg via INTRAVENOUS
  Filled 2016-12-20: qty 2

## 2016-12-20 MED ORDER — TRAMADOL HCL 50 MG PO TABS: 50.0000 mg | ORAL_TABLET | Freq: Four times a day (QID) | ORAL | 0 refills | Status: DC | PRN

## 2016-12-20 MED ORDER — IBUPROFEN 800 MG PO TABS
800.0000 mg | ORAL_TABLET | Freq: Once | ORAL | Status: AC
  Administered 2016-12-20: 800 mg via ORAL
  Filled 2016-12-20: qty 1

## 2016-12-20 MED ORDER — KETOROLAC TROMETHAMINE 30 MG/ML IJ SOLN
30.0000 mg | Freq: Once | INTRAMUSCULAR | Status: AC
  Administered 2016-12-20: 30 mg via INTRAVENOUS
  Filled 2016-12-20: qty 1

## 2016-12-20 NOTE — ED Provider Notes (Signed)
Emergency Department Provider Note   I have reviewed the triage vital signs and the nursing notes.   HISTORY  Chief Complaint Flank Pain   HPI Mary [name] is a 51 y.o. female with PMH of fibromyalgia presents to the emergency department for evaluation of left flank pain that began yesterday. Patient describes constant, severe, pulsating pain that radiates slightly to the flank. No abdominal discomfort. No vaginal bleeding or discharge. No associated vomiting or diarrhea. The patient has had some nausea. No fevers or chills. No history of kidney stones. She reports history of kidney infection but this does not feel similar. Pain is worse with movement. No alleviating factors.    Past Medical History:  Diagnosis Date  . Fibromyalgia   . Migraine   . Thyroid disease     Patient Active Problem List   Diagnosis Date Noted  . Palpitations 09/25/2015  . Adjustment disorder with mixed anxiety and depressed mood 08/03/2014  . Fibromyalgia affecting multiple sites 05/28/2014    Past Surgical History:  Procedure Laterality Date  . TONSILLECTOMY    . TUBAL LIGATION    . uterine ablation      Current Outpatient Rx  . Order #: [id number] Class: Print  . Order #: [id number] Class: Print  . Order #: [id number] Class: Normal  . Order #: [id number] Class: Print  . Order #: [id number] Class: Historical Med  . Order #: [id number] Class: Print  . Order #: [id number] Class: Normal  . Order #: [id number] Class: Normal  . Order #: [id number] Class: Print    Allergies Patient has no known allergies.  Family History  Problem Relation Age of Onset  . Heart disease Mother   . Heart disease Father     Social History Social History  Substance Use Topics  . Smoking status: Current Every Day Smoker    Types: Cigarettes  . Smokeless tobacco: Never Used     Comment: quit feb 2016  . Alcohol use No    Review of Systems  Constitutional: No fever/chills Eyes: No visual changes. ENT: No  sore throat. Cardiovascular: Denies chest pain. Respiratory: Denies shortness of breath. Gastrointestinal: No abdominal pain.  No nausea, no vomiting.  No diarrhea.  No constipation. Genitourinary: Negative for dysuria. Positive left flank pain.  Musculoskeletal: Negative for back pain. Skin: Negative for rash. Neurological: Negative for headaches, focal weakness or numbness.  10-point ROS otherwise negative.  ____________________________________________   PHYSICAL EXAM:  VITAL SIGNS: ED Triage Vitals  Enc Vitals Group     BP 12/20/16 1354 133/83     Pulse Rate 12/20/16 1354 100     Resp 12/20/16 1354 18     Temp 12/20/16 1354 100 F (37.8 C)     Temp Source 12/20/16 1354 Oral     SpO2 12/20/16 1354 100 %     Weight 12/20/16 1354 160 lb (72.6 kg)     Height 12/20/16 1354 5\' 3"  (1.6 m)     Pain Score 12/20/16 1355 6   Constitutional: Alert and oriented. Well appearing and in no acute distress. Eyes: Conjunctivae are normal.  Head: Atraumatic. Nose: No congestion/rhinnorhea. Mouth/Throat: Mucous membranes are moist.  Oropharynx non-erythematous. Neck: No stridor.  Cardiovascular: Normal rate, regular rhythm. Good peripheral circulation. Grossly normal heart sounds.   Respiratory: Normal respiratory effort.  No retractions. Lungs CTAB. Gastrointestinal: Soft and nontender. No distention. No CVA tenderness.  Musculoskeletal: No lower extremity tenderness nor edema. No gross deformities of extremities. Neurologic:  Normal speech and language. No gross focal neurologic deficits are appreciated.  Skin:  Skin is warm, dry and intact. No rash noted. Psychiatric: Mood and affect are normal. Speech and behavior are normal.  ____________________________________________   LABS (all labs ordered are listed, but only abnormal results are displayed)  Labs Reviewed  URINALYSIS, ROUTINE W REFLEX MICROSCOPIC - Abnormal; Notable for the following:       Result Value   Color, Urine  STRAW (*)    Specific Gravity, Urine 1.003 (*)    Hgb urine dipstick MODERATE (*)    Leukocytes, UA TRACE (*)    Bacteria, UA RARE (*)    All other components within normal limits  COMPREHENSIVE METABOLIC PANEL - Abnormal; Notable for the following:    Calcium 8.7 (*)    All other components within normal limits  CBC WITH DIFFERENTIAL/PLATELET - Abnormal; Notable for the following:    WBC 10.7 (*)    All other components within normal limits  I-STAT BETA HCG BLOOD, ED ([facility], [facility], [facility] ONLY)   ____________________________________________  RADIOLOGY  Ct Renal Stone Study  Result Date: 12/20/2016 CLINICAL DATA:  LEFT flank pain for 1 day.  microhematuria nausea EXAM: CT ABDOMEN AND PELVIS WITHOUT CONTRAST TECHNIQUE: Multidetector CT imaging of the abdomen and pelvis was performed following the standard protocol without IV contrast. COMPARISON:  CT 06/22/2013 FINDINGS: Lower chest: Lung bases are clear. Hepatobiliary: No focal hepatic lesion. No biliary duct dilatation. Gallbladder is normal. Common bile duct is normal. Pancreas: Pancreas is normal. No ductal dilatation. No pancreatic inflammation. Spleen: Normal spleen Adrenals/urinary tract: Adrenal glands and kidneys are normal. The ureters and bladder normal. Stomach/Bowel: Stomach, small bowel, appendix, and cecum are normal. The colon and rectosigmoid colon are normal. Vascular/Lymphatic: Abdominal aorta is normal caliber. There is no retroperitoneal or periportal lymphadenopathy. No pelvic lymphadenopathy. Reproductive: Uterus and ovaries normal Other: No free fluid. Musculoskeletal: No aggressive osseous lesion. Rounded sclerotic lesion the posterior RIGHT iliac bone is likely a benign bone island and unchanged prior. IMPRESSION: 1. No explanation for hematuria. No nephrolithiasis, ureterolithiasis, or obstructive uropathy 2.  No bladder stones . Electronically Signed   By: Genevive BiStewart  Edmunds M.D.   On: 12/20/2016 17:01     ____________________________________________   PROCEDURES  Procedure(s) performed:   Procedures  None ____________________________________________   INITIAL IMPRESSION / ASSESSMENT AND PLAN / ED COURSE  Pertinent labs & imaging results that were available during my care of the patient were reviewed by me and considered in my medical decision making (see chart for details).  Patient resents to the emergency department for evaluation of left flank pain that is severe, constant, nonradiating. No neurological symptoms. No midline spine tenderness. He does have some hemoglobin in the urine and no evidence of infection. CT scan shows no nephrolithiasis or other evidence of obstructive uropathy. Patient feeling better after Toradol. We will provide Motrin and tramadol prescription for home use. She will follow with her primary care physician. She will symptoms to suspect pelvic etiology for pain.   At this time, I do not feel there is any life-threatening condition present. I have reviewed and discussed all results (EKG, imaging, lab, urine as appropriate), exam findings with patient. I have reviewed nursing notes and appropriate previous records.  I feel the patient is safe to be discharged home without further emergent workup. Discussed usual and customary return precautions. Patient and family (if present) verbalize understanding and are comfortable with this plan.  Patient will follow-up with their primary care provider. If they do not have a primary care provider, information  for follow-up has been provided to them. All questions have been answered.   ____________________________________________  FINAL CLINICAL IMPRESSION(S) / ED DIAGNOSES  Final diagnoses:  Left flank pain     MEDICATIONS GIVEN DURING THIS VISIT:  Medications  ibuprofen (ADVIL,MOTRIN) tablet 800 mg (not administered)  ketorolac (TORADOL) 30 MG/ML injection 30 mg (30 mg Intravenous Given 12/20/16 1556)   ondansetron (ZOFRAN) injection 4 mg (4 mg Intravenous Given 12/20/16 1556)     NEW OUTPATIENT MEDICATIONS STARTED DURING THIS VISIT:  New Prescriptions   IBUPROFEN (ADVIL,MOTRIN) 800 MG TABLET    Take 1 tablet (800 mg total) by mouth 3 (three) times daily.   TRAMADOL (ULTRAM) 50 MG TABLET    Take 1 tablet (50 mg total) by mouth every 6 (six) hours as needed.      Note:  This document was prepared using Dragon voice recognition software and may include unintentional dictation errors.  Alona Bene, MD Emergency Medicine   Maia Plan, MD 12/20/16 1726

## 2016-12-20 NOTE — ED Notes (Signed)
To Ct 

## 2016-12-20 NOTE — ED Notes (Signed)
Patient requesting something for pain  Dr. Long made aware

## 2016-12-20 NOTE — Discharge Instructions (Signed)
You have been seen in the Emergency Department (ED) for back and flank pain.  Your evaluation did not identify a clear cause of your symptoms but was generally reassuring.  Please follow up as instructed above regarding today?s emergent visit and the symptoms that are bothering you.  Return to the ED if your abdominal pain worsens or fails to improve, you develop bloody vomiting, bloody diarrhea, you are unable to tolerate fluids due to vomiting, fever greater than 101, or other symptoms that concern you.

## 2016-12-20 NOTE — ED Triage Notes (Signed)
Pt comes in with left flank pain starting yesterday. Pt denies any urinary problems other than she is not using the bathroom as much as usual. She is having nausea. Denies vomiting or diarrhea. Pt has hx of kidney infections.

## 2017-01-06 ENCOUNTER — Encounter: Payer: Self-pay | Admitting: Nurse Practitioner

## 2017-01-06 ENCOUNTER — Telehealth: Payer: Self-pay | Admitting: Family Medicine

## 2017-01-06 NOTE — Telephone Encounter (Signed)
Sent mychart message

## 2017-01-06 NOTE — Telephone Encounter (Signed)
Pt called stating that she recently started taking Lipitor and has 2 or 3 left and is feeling nauseated and is sweating unusually. Pt also stated that she is having trouble remembering things. Pt states that she is also taking nortriptyline and trazodone that is not on her med list. Pt also wants us to know that she is no longer taking the Prozac. Pt states that she is taking all three of her medications at night. Please advise.

## 2017-01-27 ENCOUNTER — Telehealth: Payer: Self-pay | Admitting: Family Medicine

## 2017-01-27 NOTE — Telephone Encounter (Signed)
Ibuprofen for achiness or pain sudafed or dimetapp for cong and drainage

## 2017-01-27 NOTE — Telephone Encounter (Signed)
Spoke with patient and informed her per Dr.Steve Luking- Ibuprofen for achiness or pain sudafed or dimetapp for congestion and drainage. Patient verbalized understanding.

## 2017-01-27 NOTE — Telephone Encounter (Signed)
Patient is having tightness in her head, ears are starting to hurt and her throat is scratchy.   She doesn't know if she has a fever.  She wants to know what is best for her to purchase OTC.

## 2017-02-08 ENCOUNTER — Telehealth: Payer: Self-pay | Admitting: Family Medicine

## 2017-02-08 NOTE — Telephone Encounter (Signed)
Patient calling to see if we will take over prescribing her anxiety medication.  She has been in counseling at [facility] for over a year but doesn't really think that has helped much.  She has found a support group for mother's with children on drugs at one of the local churches and is going to start attending that but needs to make sure she will be able to get her refills.  Patient can't remember the name of the 2 meds she takes but is calling the pharmacy to check.  She is not out of refills yet, and is going to make an appointment to come in to discuss, but wanted to know in advance to making an appt. If this is something we can do.

## 2017-02-08 NOTE — Telephone Encounter (Signed)
Pt called stating that she was able to go through [facility] and for [place] to cancel this message.

## 2017-05-08 ENCOUNTER — Encounter: Payer: Self-pay | Admitting: Family Medicine

## 2017-05-08 ENCOUNTER — Ambulatory Visit (INDEPENDENT_AMBULATORY_CARE_PROVIDER_SITE_OTHER): Payer: 59 | Admitting: Family Medicine

## 2017-05-08 VITALS — BP 110/80 | Temp 98.4°F | Ht 63.0 in | Wt 164.0 lb

## 2017-05-08 DIAGNOSIS — J310 Chronic rhinitis: Secondary | ICD-10-CM

## 2017-05-08 DIAGNOSIS — J329 Chronic sinusitis, unspecified: Secondary | ICD-10-CM

## 2017-05-08 MED ORDER — DOXYCYCLINE HYCLATE 100 MG PO TABS: 100.0000 mg | ORAL_TABLET | Freq: Two times a day (BID) | ORAL | 0 refills | Status: DC
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
## 2017-05-10 ENCOUNTER — Ambulatory Visit: Payer: 59 | Admitting: Family Medicine

## 2017-05-25 ENCOUNTER — Telehealth: Payer: Self-pay | Admitting: Family Medicine

## 2017-05-25 NOTE — Telephone Encounter (Signed)
Patient said that she used to see Dr. Corliss Skainseveshwar for her fibromyalgia.  She said she called their office and she hasn't been seen in a while so they told her to call her PCP and have blood work and labs done.  She said when the weather gets like this, it gets extremely painful and she doesn't know what to do.

## 2017-05-25 NOTE — Telephone Encounter (Signed)
Patient stated she has not been there in 4 years and they said she would need a new referral

## 2017-05-25 NOTE — Telephone Encounter (Signed)
Can we refer her back to their office to get her seen? If she has already done work up for fibromyalgia, not sure which labs they want at this point. Depending on tests, we may have a hard time getting insurance to pay for specialized tests from a primary care office.

## 2017-05-26 ENCOUNTER — Other Ambulatory Visit: Payer: Self-pay | Admitting: Nurse Practitioner

## 2017-05-26 DIAGNOSIS — M797 Fibromyalgia: Secondary | ICD-10-CM

## 2017-05-26 NOTE — Telephone Encounter (Signed)
Referral put in system

## 2017-07-20 ENCOUNTER — Ambulatory Visit: Payer: 59 | Admitting: Family Medicine

## 2017-08-22 ENCOUNTER — Ambulatory Visit (HOSPITAL_COMMUNITY)
Admission: EM | Admit: 2017-08-22 | Discharge: 2017-08-22 | Disposition: A | Discharge status: Home / Self Care | Payer: Self-pay | Attending: Family Medicine | Admitting: Family Medicine

## 2017-08-22 ENCOUNTER — Other Ambulatory Visit: Payer: Self-pay

## 2017-08-22 ENCOUNTER — Encounter (HOSPITAL_COMMUNITY): Payer: Self-pay | Admitting: Emergency Medicine

## 2017-08-22 DIAGNOSIS — R002 Palpitations: Secondary | ICD-10-CM

## 2017-08-22 NOTE — Discharge Instructions (Signed)
Your EKG does not show any serious problems at this time. I believe that your symptoms are coming from acute stress reaction.  My advice is to stay home today and discuss your employment situation with your current boss.
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
## 2017-09-15 ENCOUNTER — Encounter (HOSPITAL_COMMUNITY): Payer: Self-pay | Admitting: *Deleted

## 2017-09-15 ENCOUNTER — Emergency Department (HOSPITAL_COMMUNITY): Payer: Self-pay

## 2017-09-15 ENCOUNTER — Other Ambulatory Visit: Payer: Self-pay

## 2017-09-15 ENCOUNTER — Emergency Department (HOSPITAL_COMMUNITY)
Admission: EM | Admit: 2017-09-15 | Discharge: 2017-09-16 | Disposition: A | Discharge status: Home / Self Care | Payer: Self-pay | Attending: Emergency Medicine | Admitting: Emergency Medicine

## 2017-09-15 DIAGNOSIS — F17210 Nicotine dependence, cigarettes, uncomplicated: Secondary | ICD-10-CM | POA: Insufficient documentation

## 2017-09-15 DIAGNOSIS — M79641 Pain in right hand: Secondary | ICD-10-CM | POA: Insufficient documentation

## 2017-09-15 DIAGNOSIS — Z79899 Other long term (current) drug therapy: Secondary | ICD-10-CM | POA: Insufficient documentation

## 2017-09-15 MED ORDER — TRAMADOL HCL 50 MG PO TABS: 50.0000 mg | ORAL_TABLET | Freq: Four times a day (QID) | ORAL | 0 refills | Status: DC | PRN

## 2017-09-15 MED ORDER — OXYCODONE-ACETAMINOPHEN 5-325 MG PO TABS
1.0000 | ORAL_TABLET | Freq: Once | ORAL | Status: AC
  Administered 2017-09-15: 1 via ORAL
  Filled 2017-09-15: qty 1

## 2017-09-15 MED ORDER — IBUPROFEN 800 MG PO TABS
800.0000 mg | ORAL_TABLET | Freq: Once | ORAL | Status: AC
  Administered 2017-09-15: 800 mg via ORAL
  Filled 2017-09-15: qty 1

## 2017-09-15 MED ORDER — IBUPROFEN 800 MG PO TABS: 800.0000 mg | ORAL_TABLET | Freq: Three times a day (TID) | ORAL | 0 refills | Status: DC

## 2017-09-15 NOTE — ED Provider Notes (Signed)
[facility] EMERGENCY DEPARTMENT Provider Note   CSN: [id number] Arrival date & time: 09/15/17  2016     History   Chief Complaint Chief Complaint  Patient presents with  . Hand Pain    HPI Mary [name] is a 51 y.o. female.  HPI   Mary [name] is a 51 y.o. female who presents to the Emergency Department complaining of pain of her right hand.  She describes a pain to her wrist and along the thenar eminence of her right thumb that is gradually been increasing in severity for a few days.  Tonight, she states the pain became worse and mostly originating from her wrist.  Pain is worse with movement of her wrist and fingers.  She states that her fingers feel cold and tingly.  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  She denies swelling, discoloration, or pain proximal to the wrist.    Past Medical History:  Diagnosis Date  . Fibromyalgia   . Migraine   . Thyroid disease     Patient Active Problem List   Diagnosis Date Noted  . Palpitations 09/25/2015  . Adjustment disorder with mixed anxiety and depressed mood 08/03/2014  . Fibromyalgia affecting multiple sites 05/28/2014    Past Surgical History:  Procedure Laterality Date  . TONSILLECTOMY    . TUBAL LIGATION    . uterine ablation      OB History    No data available       Home Medications    Prior to Admission medications   Medication Sig Start Date End Date Taking? Authorizing Provider  acetaminophen (TYLENOL) 325 MG tablet Take 2 tablets (650 mg total) by mouth every 6 (six) hours as needed for mild pain, moderate pain or headache. 08/30/16   Everlene Farrier, PA-C  amoxicillin-clavulanate (AUGMENTIN) 875-125 MG tablet amoxicillin 875 mg-potassium clavulanate 125 mg tablet  Take 1 tablet every 12 hours by oral route for 14 days.    [provider]  atorvastatin (LIPITOR) 20 MG tablet Take 1 tablet (20 mg total) by mouth at bedtime. Patient not taking: Reported on  05/08/2017 12/06/16   Merlyn Albert, MD  doxycycline (VIBRA-TABS) 100 MG tablet Take 1 tablet (100 mg total) by mouth 2 (two) times daily. 05/08/17   Merlyn Albert, MD  fexofenadine-pseudoephedrine (ALLEGRA-D ALLERGY & CONGESTION) 180-240 MG per 24 hr tablet Take 1 tablet by mouth daily. Patient taking differently: Take 1 tablet by mouth daily as needed (for allergies).  12/14/14   Raeford Razor, MD  ibuprofen (ADVIL,MOTRIN) 800 MG tablet Take 1 tablet (800 mg total) by mouth 3 (three) times daily. Take with food 09/15/17   Donel Osowski, PA-C  levothyroxine (SYNTHROID, LEVOTHROID) 50 MCG tablet TAKE ONE TABLET BY MOUTH ONCE DAILY BEFORE BREAKFAST 11/29/16   Merlyn Albert, MD  nortriptyline (PAMELOR) 50 MG capsule Take 50 mg by mouth.    [provider]  rosuvastatin (CRESTOR) 5 MG tablet Take 1 tablet (5 mg total) by mouth daily. Patient not taking: Reported on 10/16/2015 10/07/15   Laqueta Linden, MD  tiZANidine (ZANAFLEX) 4 MG capsule Take by mouth.    [provider]  traMADol (ULTRAM) 50 MG tablet Take 1 tablet (50 mg total) by mouth every 6 (six) hours as needed. 09/15/17   Pauline Aus, PA-C    Family History Family History  Problem Relation Age of Onset  . Heart disease Mother   . Heart disease Father  Social History Social History   Tobacco Use  . Smoking status: Current Every Day Smoker    Types: Cigarettes  . Smokeless tobacco: Never Used  . Tobacco comment: quit feb 2016  Substance Use Topics  . Alcohol use: No    Alcohol/week: 0.0 oz  . Drug use: No     Allergies   Patient has no known allergies.   Review of Systems Review of Systems  Constitutional: Negative for chills and fever.  Cardiovascular: Negative for chest pain.  Musculoskeletal: Positive for arthralgias (right hand pain). Negative for joint swelling and neck pain.  Skin: Negative for color change and wound.  Neurological: Positive for numbness (tingling  sensation of the fingers of the right hand). Negative for weakness.  All other systems reviewed and are negative.    Physical Exam Updated Vital Signs BP 102/79 (BP Location: Left Arm)   Pulse 91   Temp 98.8 F (37.1 C) (Oral)   Resp 18   Wt 76.2 kg (168 lb)   SpO2 98%   BMI 29.76 kg/m   Physical Exam  Constitutional: She is oriented to person, place, and time. She appears well-developed and well-nourished. No distress.  HENT:  Mouth/Throat: Oropharynx is clear and moist.  Neck: Normal range of motion. Neck supple.  Cardiovascular: Normal rate, regular rhythm and intact distal pulses.  Strong, palpable radial pulses bilaterally.    Pulmonary/Chest: Effort normal and breath sounds normal. No respiratory distress.  Musculoskeletal: She exhibits no edema or deformity.  Positive Tinel's sign.  Tenderness of the distal wrist.  Patient has limited range of motion of the fingers of the right hand due to level of pain  Lymphadenopathy:    She has no cervical adenopathy.  Neurological: She is alert and oriented to person, place, and time. No sensory deficit. GCS eye subscore is 4. GCS verbal subscore is 5. GCS motor subscore is 6.  Skin: Skin is warm and dry. Capillary refill takes less than 2 seconds. No rash noted. No erythema.  Fingers of the right hand are warm and pink.  Nailbeds do not appear dusky.    Nursing note and vitals reviewed.    ED Treatments / Results  Labs (all labs ordered are listed, but only abnormal results are displayed) Labs Reviewed - No data to display  EKG  EKG Interpretation None       Radiology Dg Wrist Complete Right  Result Date: 09/15/2017 CLINICAL DATA:  Increasing right hand and wrist pain for 2 days. No known injury. EXAM: RIGHT WRIST - COMPLETE 3+ VIEW; RIGHT HAND - COMPLETE 3+ VIEW COMPARISON:  None. FINDINGS: Three views of the right hand and four views of the right wrist are obtained. Degenerative changes in the interphalangeal joints.  No evidence of acute fracture or dislocation involving the right hand or right wrist. No focal bone lesion or bone destruction. Bone cortex appears intact. Soft tissues are unremarkable. IMPRESSION: Degenerative changes in the interphalangeal joints. No acute bony abnormalities involving the right hand or right wrist. Electronically Signed   By: Burman NievesWilliam  Stevens M.D.   On: 09/15/2017 23:03   Dg Hand Complete Right  Result Date: 09/15/2017 CLINICAL DATA:  Increasing right hand and wrist pain for 2 days. No known injury. EXAM: RIGHT WRIST - COMPLETE 3+ VIEW; RIGHT HAND - COMPLETE 3+ VIEW COMPARISON:  None. FINDINGS: Three views of the right hand and four views of the right wrist are obtained. Degenerative changes in the interphalangeal joints. No evidence of acute fracture  or dislocation involving the right hand or right wrist. No focal bone lesion or bone destruction. Bone cortex appears intact. Soft tissues are unremarkable. IMPRESSION: Degenerative changes in the interphalangeal joints. No acute bony abnormalities involving the right hand or right wrist. Electronically Signed   By: Burman NievesWilliam  Stevens M.D.   On: 09/15/2017 23:03    Procedures Procedures (including critical care time)  Medications Ordered in ED Medications  ibuprofen (ADVIL,MOTRIN) tablet 800 mg (not administered)  oxyCODONE-acetaminophen (PERCOCET/ROXICET) 5-325 MG per tablet 1 tablet (1 tablet Oral Given 09/15/17 2251)     Initial Impression / Assessment and Plan / ED Course  I have reviewed the triage vital signs and the nursing notes.  Pertinent labs & imaging results that were available during my care of the patient were reviewed by me and considered in my medical decision making (see chart for details).     Patient with worsening pain to her right wrist with tingling sensation of her fingers.  Neurovascularly intact.  No skin changes of the hand or fingers.  Symptoms are likely related to carpal tunnel, low clinical  suspicion for Raynauds.    We will try cockup splint, orthopedic referral.  Patient agrees to treatment plan, return precautions discussed.   Final Clinical Impressions(s) / ED Diagnoses   Final diagnoses:  Hand pain, right    ED Discharge Orders        Ordered    ibuprofen (ADVIL,MOTRIN) 800 MG tablet  3 times daily     09/15/17 2346    traMADol (ULTRAM) 50 MG tablet  Every 6 hours PRN     09/15/17 2346       Pauline Ausriplett, [place] Pacella, PA-C 09/16/17 0115    Jacalyn LefevreHaviland, Julie, MD 09/16/17 1731

## 2017-09-15 NOTE — Discharge Instructions (Signed)
Elevate your hand when possible.  You may remove the brace for bathing.  Call Dr. Mort SawyersHarrison's office to arrange a follow-up appt

## 2017-09-15 NOTE — ED Triage Notes (Signed)
Pt c/o right hand pain that started today; pt states she is unable to move her hand; pt denies any injury

## 2017-10-22 ENCOUNTER — Ambulatory Visit: Payer: Self-pay | Admitting: Family Medicine

## 2017-10-22 VITALS — BP 110/80 | HR 88 | Temp 98.6°F | Resp 18 | Wt 165.0 lb

## 2017-10-22 DIAGNOSIS — J029 Acute pharyngitis, unspecified: Secondary | ICD-10-CM

## 2017-10-22 DIAGNOSIS — R52 Pain, unspecified: Secondary | ICD-10-CM

## 2017-10-22 LAB — POCT RAPID STREP A (OFFICE): Rapid Strep A Screen: NEGATIVE

## 2017-10-22 LAB — POCT INFLUENZA A/B
Influenza A, POC: NEGATIVE
Influenza B, POC: NEGATIVE

## 2017-10-22 MED ORDER — FLUCONAZOLE 150 MG PO TABS: 150.0000 mg | ORAL_TABLET | Freq: Once | ORAL | 0 refills | Status: AC

## 2017-10-22 MED ORDER — AMOXICILLIN-POT CLAVULANATE 875-125 MG PO TABS: 1.0000 | ORAL_TABLET | Freq: Two times a day (BID) | ORAL | 0 refills | Status: DC

## 2017-10-22 NOTE — Progress Notes (Signed)
Subjective:  Mary BariKimberly D Marcantonio is a 51 y.o. female with a history of fibromyalgia, mixed depression/anxiety, hypothyroidism,  who presents for evaluation of worsening fatigue, sore throat, headache, and generalized body aches ongoing for 3 days. Initially, reports feeling body aches, nausea and experiencing low grade fever with TMAX 100.3. Symptoms worsen to include headache, bilateral ear pain, and persistent sore throat and worsening body aches. She started taking her spouses azithromycin prescription on Friday and reports feeling persistently worse despite self medicating.  She is occasionally coughing up thick mucus which is green with small specs of blood as well as nasal mucus with the same appearance. Denies SOB, wheezing, dizziness, or chest pain. No known sick contacts. No history of asthma or bronchitis.  The following portions of the patient's history were reviewed and updated as appropriate:  allergies, current medications and past medical history.  Constitutional: positive for fatigue and fever Eyes: negative Ears, nose, mouth, throat, and face: positive for earaches, nasal congestion, snoring, sore mouth and sore throat Respiratory: positive for cough Cardiovascular: negative Gastrointestinal: positive for nausea. Objective:  Physical Exam  Constitutional: She is oriented to person, place, and time. Vital signs are normal. She appears well-developed and well-nourished. She appears ill.  HENT:  Head: Normocephalic.  Right Ear: Hearing, tympanic membrane, external ear and ear canal normal.  Left Ear: Hearing, tympanic membrane, external ear and ear canal normal.  Nose: Rhinorrhea present. No mucosal edema.  Mouth/Throat: Uvula is midline. Mucous membranes are dry. Posterior oropharyngeal edema and posterior oropharyngeal erythema present. No oropharyngeal exudate or tonsillar abscesses.  Eyes: Conjunctivae are normal. Pupils are equal, round, and reactive to light.  Neck: No  thyromegaly present.  Cardiovascular: Normal rate, regular rhythm, normal heart sounds and intact distal pulses.  Respiratory: Effort normal and breath sounds normal.  GI: Soft. Bowel sounds are normal. She exhibits no distension. There is no tenderness.  Musculoskeletal: Normal range of motion.  Lymphadenopathy:    She has cervical adenopathy.  Neurological: She is alert and oriented to person, place, and time.  Skin: Skin is warm and dry.  Psychiatric: She has a normal mood and affect. Her behavior is normal. Judgment and thought content normal.   Assessment:  Pharyngitis - Differentials include influenza, step throat, mononucleosis, pharyngitis. Rapid Flu and Rapid strep both negative. Unable to send out cultures at practice currently.  Posterior pharyngeal wall appears edematous and erythematous  Patient is ill-appearing, although remains afebrile today without antipyretic therapy. Will treat empirically with broad-spectrum antibiotic for a diagnosis of Pharyngitis .   Plan:  Start  Augmentin  875-125 mg twice daily x 10 day. Precautions given if symptoms worsen or do not improve, follow-up with PCP for further evaluation.  Meds ordered this encounter  Medications  . amoxicillin-clavulanate (AUGMENTIN) 875-125 MG tablet    Sig: Take 1 tablet by mouth 2 (two) times daily.    Dispense:  20 tablet    Refill:  0  . fluconazole (DIFLUCAN) 150 MG tablet    Sig: Take 1 tablet (150 mg total) by mouth once for 1 dose.    Dispense:  1 tablet    Refill:  0    Orders Placed This Encounter  Procedures  . POCT Influenza A/B  . POCT rapid strep A   Godfrey PickKimberly S. Tiburcio PeaHarris, MSN, FNP-C [id number]  [place], [place] [id number] [phone]

## 2017-10-22 NOTE — Patient Instructions (Signed)
I am treating you for pharyngitis with Augmentin 1 tablet twice daily times 10 days.  If  your symptoms have not improved within the next 72 hours I do recommend that he follow-up with your PCP.  I have also prescribed Diflucan 1 time tablet but I recommend that you take 72 hours after starting Augmentin to reduce the risk of acquiring a yeast infection.  For sore throat I do recommend Chloraseptic spray you may use up to 7-10 times daily as needed to reduce throat pain.  I recommend that you continue to hydrate with water and non-acid drinks to reduce throat irritation and improve hydration status. If symptoms worsen or do not improve, go to the ED or urgent care for further evaluation.    Pharyngitis Pharyngitis is a sore throat (pharynx). There is redness, pain, and swelling of your throat. Follow these instructions at home:  Drink enough fluids to keep your pee (urine) clear or pale yellow.  Only take medicine as told by your doctor. ? You may get sick again if you do not take medicine as told. Finish your medicines, even if you start to feel better. ? Do not take aspirin.  Rest.  Rinse your mouth (gargle) with salt water ( tsp of salt per 1 qt of water) every 1-2 hours. This will help the pain.  If you are not at risk for choking, you can suck on hard candy or sore throat lozenges. Contact a doctor if:  You have large, tender lumps on your neck.  You have a rash.  You cough up green, yellow-brown, or bloody spit. Get help right away if:  You have a stiff neck.  You drool or cannot swallow liquids.  You throw up (vomit) or are not able to keep medicine or liquids down.  You have very bad pain that does not go away with medicine.  You have problems breathing (not from a stuffy nose). This information is not intended to replace advice given to you by your health care provider. Make sure you discuss any questions you have with your health care provider. Document Released:  03/28/2008 Document Revised: 03/17/2016 Document Reviewed: 06/17/2013 Elsevier Interactive Patient Education  2017 [employer]

## 2017-10-22 NOTE — Progress Notes (Signed)
.  ic

## 2017-10-25 ENCOUNTER — Telehealth: Payer: Self-pay

## 2017-10-25 NOTE — Telephone Encounter (Signed)
Voice mail is full, I was not able to left a message.

## 2017-11-08 ENCOUNTER — Encounter: Payer: Self-pay | Admitting: Family Medicine

## 2017-11-08 ENCOUNTER — Ambulatory Visit: Payer: Self-pay | Admitting: Family Medicine

## 2017-11-08 VITALS — BP 102/74 | Ht 63.0 in | Wt 164.0 lb

## 2017-11-08 DIAGNOSIS — J329 Chronic sinusitis, unspecified: Secondary | ICD-10-CM

## 2017-11-08 DIAGNOSIS — J310 Chronic rhinitis: Secondary | ICD-10-CM

## 2017-11-08 MED ORDER — SULFAMETHOXAZOLE-TRIMETHOPRIM 800-160 MG PO TABS: 1.0000 | ORAL_TABLET | Freq: Two times a day (BID) | ORAL | 0 refills | Status: DC

## 2017-11-08 NOTE — Progress Notes (Signed)
   Subjective:    Patient ID: Mary [name], female    DOB: 1965/12/22, 52 y.o.   MRN: [id number]  HPI  Patient is here today with complaints of left ear pain,sinus drainage green in color,sorethroat,sinus pressure,productive cough. This is on going since January 2,2019 at which time she went to urgent care and the gave her amoxicillin. She states the cold got better while on antibx,but returned a few days after.  Left side of throat hurts  Left ear hurts  Dim energy   Some headache not bad but this mporn had one,,  Pressure in the eyus Review of Systems No headache, no major weight loss or weight gain, no chest pain no back pain abdominal pain no change in bowel habits complete ROS otherwise negative     Objective:   Physical Exam  Alert, mild malaise. Hydration good Vitals stable. frontal/ maxillary tenderness evident positive nasal congestion. pharynx normal neck supple  lungs clear/no crackles or wheezes. heart regular in rhythm      Assessment & Plan:  Impression rhinosinusitis likely post viral, discussed with patient. plan antibiotics prescribed. Questions answered. Symptomatic care discussed. warning signs discussed. WSL

## 2017-11-16 ENCOUNTER — Other Ambulatory Visit: Payer: Self-pay | Admitting: *Deleted

## 2017-11-16 ENCOUNTER — Telehealth: Payer: Self-pay | Admitting: Family Medicine

## 2017-11-16 MED ORDER — CEFPROZIL 250 MG PO TABS: 250.0000 mg | ORAL_TABLET | Freq: Two times a day (BID) | ORAL | 0 refills | Status: DC

## 2017-11-16 NOTE — Telephone Encounter (Signed)
Patient was prescribed Bactrim on 11/08/17 by Dr. Brett CanalesSteve.  She said it keeps making her lips and mouth swell and she would like to have a different antibiotic called in.   [facility] [place]

## 2017-11-16 NOTE — Telephone Encounter (Signed)
Tried to call to get more info. Voicemail not set up.

## 2017-11-16 NOTE — Telephone Encounter (Signed)
1.  Discontinue Bactrim DS No. 2 if severe allergic reaction occurs go to ER #3 please put under allergies that she should not take Bactrim due to the lip swelling #4 May prescribe Cefzil 250 mg twice daily next 10 days #5 follow-up if any problems

## 2017-11-16 NOTE — Telephone Encounter (Signed)
Stopped bactrim on Sunday because lips were swelling and tongue was burning. Tried to take it again yesterday and same thing happened. Swelling is gone now and no trouble breathing.

## 2017-11-16 NOTE — Telephone Encounter (Signed)
Discussed with pt. Pt verbalized understanding. Bactrim added to allergy list. cefzil sent to pharm.

## 2017-12-18 ENCOUNTER — Telehealth: Payer: Self-pay | Admitting: Family Medicine

## 2017-12-18 NOTE — Telephone Encounter (Signed)
Will need o v to disc and rx

## 2017-12-18 NOTE — Telephone Encounter (Signed)
Patient was going to counseling and doesn't want to go anymore.  She said she is having better luck going to classes at church.  She said she needs to stay on the medication she is being prescribed at counseling, and wants to know if this is something that we can prescribe here for her.  She is unsure of the name of the medicine, but is working on finding out.

## 2017-12-18 NOTE — Telephone Encounter (Signed)
Patient states she is going to counseling because her dtr is a drug addict. She states she takes one of these medications for her fibromyalgia and the other to help her sleep.Please advise.

## 2017-12-18 NOTE — Telephone Encounter (Signed)
Pt is aware and will schedule an office to discuss.

## 2017-12-18 NOTE — Telephone Encounter (Signed)
Patient called back with name of medications:   nortriptyline (PAMELOR) 50 MG capsule tiZANidine (ZANAFLEX) 4 MG capsule   Uses [facility]

## 2017-12-21 ENCOUNTER — Ambulatory Visit: Payer: Self-pay | Admitting: Family Medicine

## 2017-12-21 ENCOUNTER — Encounter: Payer: Self-pay | Admitting: Family Medicine

## 2017-12-21 ENCOUNTER — Ambulatory Visit: Payer: BLUE CROSS/BLUE SHIELD | Admitting: Family Medicine

## 2017-12-21 ENCOUNTER — Telehealth: Payer: Self-pay | Admitting: Family Medicine

## 2017-12-21 VITALS — BP 116/72 | Ht 63.0 in | Wt 167.4 lb

## 2017-12-21 DIAGNOSIS — M797 Fibromyalgia: Secondary | ICD-10-CM | POA: Diagnosis not present

## 2017-12-21 DIAGNOSIS — E039 Hypothyroidism, unspecified: Secondary | ICD-10-CM | POA: Diagnosis not present

## 2017-12-21 MED ORDER — LEVOTHYROXINE SODIUM 50 MCG PO TABS: ORAL_TABLET | ORAL | 11 refills | Status: DC

## 2017-12-21 MED ORDER — NORTRIPTYLINE HCL 50 MG PO CAPS: 50.0000 mg | ORAL_CAPSULE | Freq: Every day | ORAL | 5 refills | Status: DC

## 2017-12-21 MED ORDER — TIZANIDINE HCL 4 MG PO CAPS: 4.0000 mg | ORAL_CAPSULE | Freq: Every day | ORAL | 5 refills | Status: DC

## 2017-12-21 NOTE — Telephone Encounter (Signed)
Review blood work results in results folder from [facility]. °

## 2017-12-21 NOTE — Progress Notes (Signed)
   Subjective:    Patient ID: Mary [name], female    DOB: 16-Jul-1966, 52 y.o.   MRN: [id number]  HPI Patient arrives for a med check. Patient would like to discuss her zanaflex and pamelor.  Going to counselling cntr for two yrs, cons doen not do much, doing better with al anon  Child has active addiction sit  Recently resued exercise, worked at [employer] can do a treadmoill during lunch   nortyp qhs helps, not groggy in the a m  Uses  Just one at night , helps with the fibromyalge CBD oil going wel, [facility] , geti it form them Review of Systems No headache, no major weight loss or weight gain, no chest pain no back pain abdominal pain no change in bowel habits complete ROS otherwise negative     Objective:   Physical Exam Alert vitals stable, NAD. Blood pressure good on repeat. HEENT normal. Lungs clear. Heart regular rate and rhythm.        Assessment & Plan:  Impression fibromyalgia discussed benefiting from nighttime Zanaflex and nortriptyline so will maintain.  No longer benefiting from counseling so has stopped  2.  Hypothyroidism.  Patient states just checked through OB/GYN and told fine.  To bring in blood work to us.  Medications refilled.  Follow-up in 6 months diet exercise discussed

## 2018-04-16 ENCOUNTER — Ambulatory Visit: Payer: BLUE CROSS/BLUE SHIELD | Admitting: Family Medicine

## 2018-04-17 ENCOUNTER — Ambulatory Visit: Payer: BLUE CROSS/BLUE SHIELD | Admitting: Family Medicine

## 2018-04-17 ENCOUNTER — Encounter: Payer: Self-pay | Admitting: Family Medicine

## 2018-04-17 VITALS — BP 124/82 | Ht 63.0 in | Wt 162.8 lb

## 2018-04-17 DIAGNOSIS — F4323 Adjustment disorder with mixed anxiety and depressed mood: Secondary | ICD-10-CM

## 2018-04-17 DIAGNOSIS — E039 Hypothyroidism, unspecified: Secondary | ICD-10-CM | POA: Diagnosis not present

## 2018-04-17 DIAGNOSIS — Z79899 Other long term (current) drug therapy: Secondary | ICD-10-CM | POA: Diagnosis not present

## 2018-04-17 DIAGNOSIS — Z13220 Encounter for screening for lipoid disorders: Secondary | ICD-10-CM | POA: Diagnosis not present

## 2018-04-17 DIAGNOSIS — M797 Fibromyalgia: Secondary | ICD-10-CM

## 2018-04-17 MED ORDER — TIZANIDINE HCL 4 MG PO CAPS: 4.0000 mg | ORAL_CAPSULE | Freq: Every day | ORAL | 5 refills | Status: DC

## 2018-04-17 MED ORDER — FLUOXETINE HCL 20 MG PO TABS: ORAL_TABLET | ORAL | 5 refills | Status: DC

## 2018-04-17 MED ORDER — NORTRIPTYLINE HCL 50 MG PO CAPS: 50.0000 mg | ORAL_CAPSULE | Freq: Every day | ORAL | 5 refills | Status: DC

## 2018-04-17 MED ORDER — DOXYCYCLINE HYCLATE 100 MG PO TABS: 100.0000 mg | ORAL_TABLET | Freq: Two times a day (BID) | ORAL | 0 refills | Status: DC

## 2018-04-17 NOTE — Progress Notes (Signed)
   Subjective:    Patient ID: Mary [name], female    DOB: [birth year], 52 y.o.   MRN: [id number]  HPI Pt here for 6 month follow up on hypothyroidism and fibromyalgia.  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 Pt did have a sore like this before in left nare.     Review of Systems No rash, no major weight loss or weight gain,  no change in bowel habits complete ROS otherwise negative     Objective:   Physical Exam Alert and oriented, vitals reviewed and stable, NAD ENT-nostril positive tender sore TM's and ext canals WNL bilat via otoscopic exam Soft palate, tonsils and post pharynx WNL via oropharyngeal exam Neck-symmetric, no masses; thyroid nonpalpable and nontender Pulmonary-no tachypnea or accessory muscle use; Clear without wheezes via auscultation Card--no abnrml murmurs, rhythm reg and rate WNL Carotid pulses symmetric, without bruits  Diffuse upper shoulder tenderness to deep palpation positive pain with rotation of neck     Assessment & Plan:  1 impression fatigue.  Worsening.  Time to assess some blood work with this.  2.  Hypothyroidism status uncertain discussed need to evaluate  3.  Fibromyalgia ongoing with challenges  4.  Depression with element of anxiety.  Discussed.  Certainly a lot of family stress going on.  Medication discussed and initiated in this  regard  Await further information from blood work results

## 2018-04-20 LAB — HEPATIC FUNCTION PANEL
ALT: 18 IU/L (ref 0–32)
AST: 16 IU/L (ref 0–40)
Albumin: 4.5 g/dL (ref 3.5–5.5)
Alkaline Phosphatase: 79 IU/L (ref 39–117)
Bilirubin Total: 0.3 mg/dL (ref 0.0–1.2)
Bilirubin, Direct: 0.09 mg/dL (ref 0.00–0.40)
Total Protein: 6.7 g/dL (ref 6.0–8.5)

## 2018-04-20 LAB — BASIC METABOLIC PANEL
BUN/Creatinine Ratio: 20 (ref 9–23)
BUN: 17 mg/dL (ref 6–24)
CO2: 25 mmol/L (ref 20–29)
Calcium: 9.2 mg/dL (ref 8.7–10.2)
Chloride: 103 mmol/L (ref 96–106)
Creatinine, Ser: 0.87 mg/dL (ref 0.57–1.00)
GFR calc Af Amer: 90 mL/min/{1.73_m2} (ref >59)
GFR calc non Af Amer: 78 mL/min/{1.73_m2} (ref >59)
Glucose: 87 mg/dL (ref 65–99)
Potassium: 5 mmol/L (ref 3.5–5.2)
Sodium: 140 mmol/L (ref 134–144)

## 2018-04-20 LAB — CBC WITH DIFFERENTIAL/PLATELET
Basophils Absolute: 0 10*3/uL (ref 0.0–0.2)
Basos: 0 %
EOS (ABSOLUTE): 0.1 10*3/uL (ref 0.0–0.4)
Eos: 1 %
Hematocrit: 38.9 % (ref 34.0–46.6)
Hemoglobin: 13.2 g/dL (ref 11.1–15.9)
Immature Grans (Abs): 0 10*3/uL (ref 0.0–0.1)
Immature Granulocytes: 0 %
Lymphocytes Absolute: 2.1 10*3/uL (ref 0.7–3.1)
Lymphs: 42 %
MCH: 31.7 pg (ref 26.6–33.0)
MCHC: 33.9 g/dL (ref 31.5–35.7)
MCV: 93 fL (ref 79–97)
Monocytes Absolute: 0.4 10*3/uL (ref 0.1–0.9)
Monocytes: 8 %
Neutrophils Absolute: 2.5 10*3/uL (ref 1.4–7.0)
Neutrophils: 49 %
Platelets: 311 10*3/uL (ref 150–450)
RBC: 4.17 x10E6/uL (ref 3.77–5.28)
RDW: 13 % (ref 12.3–15.4)
WBC: 5.1 10*3/uL (ref 3.4–10.8)

## 2018-04-20 LAB — LIPID PANEL
Chol/HDL Ratio: 4.2 ratio (ref 0.0–4.4)
Cholesterol, Total: 206 mg/dL — ABNORMAL HIGH (ref 100–199)
HDL: 49 mg/dL (ref >39)
LDL Calculated: 137 mg/dL — ABNORMAL HIGH (ref 0–99)
Triglycerides: 101 mg/dL (ref 0–149)
VLDL Cholesterol Cal: 20 mg/dL (ref 5–40)

## 2018-04-20 LAB — TSH: TSH: 1.31 u[IU]/mL (ref 0.450–4.500)

## 2018-04-23 ENCOUNTER — Encounter: Payer: Self-pay | Admitting: Family Medicine

## 2018-04-30 ENCOUNTER — Telehealth: Payer: Self-pay | Admitting: Family Medicine

## 2018-04-30 NOTE — Telephone Encounter (Signed)
Pt needs copies of all her OV notes for any visits here from 10/24/17 - present  Please call pt when ready to pick up & she will sign release when picking up records

## 2018-05-01 ENCOUNTER — Telehealth: Payer: Self-pay | Admitting: Family Medicine

## 2018-05-01 ENCOUNTER — Other Ambulatory Visit: Payer: Self-pay | Admitting: *Deleted

## 2018-05-01 MED ORDER — FLUOXETINE HCL 20 MG PO TABS: ORAL_TABLET | ORAL | 0 refills | Status: DC

## 2018-05-01 NOTE — Telephone Encounter (Signed)
[facility] Lianne CureJosie[facility] States she will document that it is ok to take both meds.

## 2018-05-01 NOTE — Telephone Encounter (Signed)
Please clarify which directions pt should be taking. On med list it has prozac 20mg  one daily and also 2 daily. Pt has not picked up rx yet. Both prescriptions have 6 months worth and was sent in on 04/17/18. The one with directions 2 daily states to start 30 days after the other one. Was pt suppose to do 20mg  one daily for 30 days then go to 2 daily.

## 2018-05-01 NOTE — Telephone Encounter (Signed)
Pt is requesting having a medication that Dr. Brett CanalesSteve prescribed her called in. She thought he escribed it to pharmacy. She has lost the paper scripts. She doesn't remember the name of the medication.

## 2018-05-01 NOTE — Telephone Encounter (Signed)
Tried to call no answer. [facility] called [facility] and they will get rx ready. Pt is to take prozac 20mg  one daily for 30 days then go to 2 daily. Refills canceled on the prescription with one daily.

## 2018-05-01 NOTE — Telephone Encounter (Signed)
Rel low dose of nortyp may take both

## 2018-05-01 NOTE — Telephone Encounter (Signed)
One daily for thirty days, then go to two aily

## 2018-05-01 NOTE — Telephone Encounter (Signed)
Fax from pharmacy stating that Fluoxetine 20mg  in combonation with Nortriptyline may elevate nortriptyline levels and cause QT prolongation. Please advise. Thanks

## 2018-05-02 ENCOUNTER — Telehealth: Payer: Self-pay | Admitting: Family Medicine

## 2018-05-02 ENCOUNTER — Other Ambulatory Visit: Payer: Self-pay | Admitting: *Deleted

## 2018-05-02 MED ORDER — FLUOXETINE HCL 20 MG PO CAPS: ORAL_CAPSULE | ORAL | 4 refills | Status: DC

## 2018-05-02 MED ORDER — FLUOXETINE HCL 20 MG PO CAPS: 20.0000 mg | ORAL_CAPSULE | Freq: Every day | ORAL | 0 refills | Status: DC

## 2018-05-02 NOTE — Telephone Encounter (Signed)
Pt went to pick up FLUoxetine (PROZAC) 20 MG tablet. She didn't pick them up due to the capsules being cheaper. [facility]

## 2018-05-02 NOTE — Telephone Encounter (Signed)
Done, notified patient

## 2018-05-02 NOTE — Telephone Encounter (Signed)
Switched to capsules ok per dr Brett Canalessteve. meds sent to pharm. Pt notified.

## 2018-06-05 ENCOUNTER — Other Ambulatory Visit: Payer: Self-pay | Admitting: Family Medicine

## 2018-06-20 ENCOUNTER — Ambulatory Visit: Payer: BLUE CROSS/BLUE SHIELD | Admitting: Family Medicine

## 2018-06-21 ENCOUNTER — Telehealth: Payer: Self-pay | Admitting: Family Medicine

## 2018-06-21 ENCOUNTER — Other Ambulatory Visit: Payer: Self-pay | Admitting: Family Medicine

## 2018-06-21 MED ORDER — CIPROFLOXACIN HCL 500 MG PO TABS: ORAL_TABLET | ORAL | 0 refills | Status: DC

## 2018-06-21 NOTE — Telephone Encounter (Signed)
Pt believes she has a kidney infection. She stated it started Tuesday and she had to use a heating pad last night to ease the pain. She was unable to sleep. She doesn't have ins at the moment and is hoping [facility] CanalesSteve [facility] [place]

## 2018-06-21 NOTE — Telephone Encounter (Signed)
cipro 500 bid ten d, if worsens will ntbs here or er

## 2018-06-21 NOTE — Telephone Encounter (Signed)
Lower left area in back, throbbing pain comes and goes, heat help, going to the bathroom frequently, no fever that patient is aware, not feeling well past few days, and cloudy urine. Has been using Ibuprofen.  Has had them before and this is how it starts. Pt will not have insurance until October.

## 2018-06-21 NOTE — Telephone Encounter (Signed)
Medication sent in and patient is aware. Pt verbalized understanding that if she gets worse, she will need an office visit or go to ED.

## 2018-08-27 ENCOUNTER — Telehealth: Payer: Self-pay | Admitting: Family Medicine

## 2018-08-27 DIAGNOSIS — E039 Hypothyroidism, unspecified: Secondary | ICD-10-CM

## 2018-08-27 NOTE — Telephone Encounter (Signed)
Patient needs updated referral to Dr.Nida for her thyroid follow up. She states they told her that the referral they had on file had expired.

## 2018-08-27 NOTE — Telephone Encounter (Signed)
Patient is aware we have placed another referral in epic.

## 2018-08-27 NOTE — Telephone Encounter (Signed)
Let's do 

## 2018-08-27 NOTE — Telephone Encounter (Signed)
Please advise 

## 2018-09-07 ENCOUNTER — Telehealth: Payer: Self-pay | Admitting: Family Medicine

## 2018-09-07 NOTE — Telephone Encounter (Signed)
Having heart flutters all week and wants to talk with a nurse.  [phone]

## 2018-09-07 NOTE — Telephone Encounter (Signed)
ok 

## 2018-09-07 NOTE — Telephone Encounter (Signed)
Patient states she has been having heart palpitations all week. Patient states it feels like a pause and then a skip and fast. Patient states she is also had some SOB but no pain. Advised patient to go to urgent care or ER for evaluation and treatment. Patient verbalized understanding.

## 2018-09-26 ENCOUNTER — Other Ambulatory Visit: Payer: Self-pay | Admitting: "Endocrinology

## 2018-09-26 ENCOUNTER — Encounter: Payer: Self-pay | Admitting: "Endocrinology

## 2018-09-26 ENCOUNTER — Ambulatory Visit: Payer: BLUE CROSS/BLUE SHIELD | Admitting: "Endocrinology

## 2018-09-26 VITALS — BP 121/83 | HR 87 | Ht 63.0 in | Wt 176.0 lb

## 2018-09-26 DIAGNOSIS — E039 Hypothyroidism, unspecified: Secondary | ICD-10-CM | POA: Diagnosis not present

## 2018-09-26 DIAGNOSIS — E049 Nontoxic goiter, unspecified: Secondary | ICD-10-CM | POA: Insufficient documentation

## 2018-09-26 NOTE — Progress Notes (Signed)
Endocrinology Consult Note                                         09/26/2018, 11:45 AM   Mary [name] is a 52 y.o.-year-old female patient being seen in consultation for hypothyroidism referred by Mary Albert, MD.   Past Medical History:  Diagnosis Date  . Fibromyalgia   . Migraine   . Thyroid disease    Past Surgical History:  Procedure Laterality Date  . TONSILLECTOMY    . TUBAL LIGATION    . uterine ablation     Social History   Socioeconomic History  . Marital status: Married    Spouse name: Not on file  . Number of children: Not on file  . Years of education: Not on file  . Highest education level: Not on file  Occupational History  . Not on file  Social Needs  . Financial resource strain: Not on file  . Food insecurity:    Worry: Not on file    Inability: Not on file  . Transportation needs:    Medical: Not on file    Non-medical: Not on file  Tobacco Use  . Smoking status: Former Smoker    Types: Cigarettes  . Smokeless tobacco: Never Used  . Tobacco comment: quit feb 2016  Substance and Sexual Activity  . Alcohol use: No    Alcohol/week: 0.0 standard drinks  . Drug use: No  . Sexual activity: Yes    Birth control/protection: Surgical  Lifestyle  . Physical activity:    Days per week: Not on file    Minutes per session: Not on file  . Stress: Not on file  Relationships  . Social connections:    Talks on phone: Not on file    Gets together: Not on file    Attends religious service: Not on file    Active member of club or organization: Not on file    Attends meetings of clubs or organizations: Not on file    Relationship status: Not on file  Other Topics Concern  . Not on file  Social History Narrative  . Not on file   Outpatient Encounter Medications as of 09/26/2018  Medication Sig  . FLUoxetine (PROZAC) 20 MG capsule Take 2 daily  . nortriptyline  (PAMELOR) 50 MG capsule Take 1 capsule (50 mg total) by mouth at bedtime.  Marland Kitchen tiZANidine (ZANAFLEX) 4 MG capsule Take 1 capsule (4 mg total) by mouth at bedtime.  Marland Kitchen acetaminophen (TYLENOL) 325 MG tablet Take 2 tablets (650 mg total) by mouth every 6 (six) hours as needed for mild pain, moderate pain or headache.  . fexofenadine-pseudoephedrine (ALLEGRA-D ALLERGY & CONGESTION) 180-240 MG per 24 hr tablet Take 1 tablet by mouth daily.  Marland Kitchen ibuprofen (ADVIL,MOTRIN) 800 MG tablet Take 1 tablet (800 mg total) by mouth 3 (three) times daily. Take with food  . levothyroxine (SYNTHROID, LEVOTHROID) 50 MCG tablet TAKE ONE  TABLET BY MOUTH ONCE DAILY BEFORE BREAKFAST  . [DISCONTINUED] ciprofloxacin (CIPRO) 500 MG tablet Take one tablet twice daily for 10 days  . [DISCONTINUED] doxycycline (VIBRA-TABS) 100 MG tablet Take 1 tablet (100 mg total) by mouth 2 (two) times daily.  . [DISCONTINUED] FLUoxetine (PROZAC) 20 MG capsule TAKE 1 CAPSULE BY MOUTH ONCE DAILY   No facility-administered encounter medications on file as of 09/26/2018.    ALLERGIES: Allergies  Allergen Reactions  . Bactrim [Sulfamethoxazole-Trimethoprim]     Lips swelling   VACCINATION STATUS:  There is no immunization history on file for this patient.   HPI    Mary [name]  is a patient with the above medical history. she was diagnosed  with hypothyroidism at approximate age of 69 years with lab work preceded by suggestive clinical symptoms.  This required subsequent initiation of thyroid hormone replacement.  She is currently on levothyroxine 50 mcg p.o. nightly.  She admits to some inconsistency taking his medication including skipping the dose altogether at times. I reviewed patient's  thyroid tests:  Lab Results  Component Value Date   TSH 1.310 04/19/2018   TSH 1.150 11/29/2016   TSH 1.620 09/25/2015   TSH 0.964 02/18/2013    -She does not have recent full profile thyroid function test to review.  She complains of  progressive weight gain, fatigue, dry skin. -She is known to have mild goiter from ultrasound of the thyroid in 2012, no subsequent thyroid ultrasound.  She denies  feeling nodules in neck, hoarseness, dysphagia/odynophagia, SOB with lying down.  she reports family history of  thyroid disorders in her mother.  No family history of thyroid cancer.  No history of  radiation therapy to head or neck. No recent use of iodine supplements.   ROS:  Constitutional: +weight gain,  + fatigue, no subjective hyperthermia, no subjective hypothermia Eyes: no blurry vision, no xerophthalmia ENT: no sore throat, no nodules palpated in throat, no dysphagia/odynophagia, no hoarseness Cardiovascular: no Chest Pain, no Shortness of Breath, no palpitations, no leg swelling Respiratory: no cough, no SOB Gastrointestinal: no Nausea/Vomiting/Diarhhea Musculoskeletal: no muscle/joint aches Skin: no rashes Neurological: no tremors, no numbness, no tingling, no dizziness Psychiatric: no depression, no anxiety   Physical Exam: BP 121/83   Pulse 87   Ht 5\' 3"  (1.6 m)   Wt 176 lb (79.8 kg)   BMI 31.18 kg/m  Wt Readings from Last 3 Encounters:  09/26/18 176 lb (79.8 kg)  04/17/18 162 lb 12.8 oz (73.8 kg)  12/21/17 167 lb 6.4 oz (75.9 kg)    Constitutional: + Obese for height, not in acute distress, normal state of mind Eyes: PERRLA, EOMI, no exophthalmos ENT: moist mucous membranes, + thyromegaly,  no cervical lymphadenopathy Cardiovascular: normal precordial activity, Regular Rate and Rhythm, no Murmur/Rubs/Gallops Respiratory:  adequate breathing efforts, no gross chest deformity, Clear to auscultation bilaterally Gastrointestinal: abdomen soft, Non -tender, No distension, Bowel Sounds present Musculoskeletal: no gross deformities, strength intact in all four extremities Skin: moist, warm, no rashes Neurological: no tremor with outstretched hands, Deep tendon reflexes normal in all four  extremities.   CMP ( most recent) CMP     Component Value Date/Time   NA 140 04/19/2018 0830   K 5.0 04/19/2018 0830   CL 103 04/19/2018 0830   CO2 25 04/19/2018 0830   GLUCOSE 87 04/19/2018 0830   GLUCOSE 87 12/20/2016 1545   BUN 17 04/19/2018 0830   CREATININE 0.87 04/19/2018 0830   CALCIUM 9.2 04/19/2018 0830  PROT 6.7 04/19/2018 0830   ALBUMIN 4.5 04/19/2018 0830   AST 16 04/19/2018 0830   ALT 18 04/19/2018 0830   ALKPHOS 79 04/19/2018 0830   BILITOT 0.3 04/19/2018 0830   GFRNONAA 78 04/19/2018 0830   GFRAA 90 04/19/2018 0830     Lipid Panel ( most recent) Lipid Panel     Component Value Date/Time   CHOL 206 (H) 04/19/2018 0830   TRIG 101 04/19/2018 0830   HDL 49 04/19/2018 0830   CHOLHDL 4.2 04/19/2018 0830   LDLCALC 137 (H) 04/19/2018 0830       Lab Results  Component Value Date   TSH 1.310 04/19/2018   TSH 1.150 11/29/2016   TSH 1.620 09/25/2015   TSH 0.964 02/18/2013      ASSESSMENT: 1. Hypothyroidism  PLAN:    Patient with long-standing hypothyroidism, on levothyroxine therapy. On physical exam , patient  does have mild goiter.    -She is advised to have repeat full profile thyroid function test today.  In the meantime, she is advised to continue levothyroxine 50 mcg p.o. nightly. - We discussed about correct intake of levothyroxine, at fasting, with water, separated by at least 30 minutes from breakfast, and separated by more than 4 hours from calcium, iron, multivitamins, acid reflux medications (PPIs). -Patient is made aware of the fact that thyroid hormone replacement is needed for life, dose to be adjusted by periodic monitoring of thyroid function tests.  -She will need surveillance thyroid/neck ultrasound given her history of prior goiter.     - Time spent with the patient: 30 minutes, of which >50% was spent in obtaining information about her symptoms, reviewing her previous labs, evaluations, and treatments, counseling her about her  hypothyroidism, goiter, and developing a plan to confirm the diagnosis and long term treatment as necessary.  Mary BariKimberly D [name] participated in the discussions, expressed understanding, and voiced agreement with the above plans.  All questions were answered to her satisfaction. she is encouraged to contact clinic should she have any questions or concerns prior to her return visit.  Return in about 1 week (around 10/03/2018) for Labs Today- Non-Fasting Ok, Thyroid / Neck Ultrasound.  Marquis LunchGebre , MD [facility] [address] [place], [place] [address] Phone: [phone]  Fax: [phone]   09/26/2018, 11:45 AM  This note was partially dictated with voice recognition software. Similar sounding words can be transcribed inadequately or may not  be corrected upon review.

## 2018-09-27 LAB — THYROID PEROXIDASE ANTIBODY: Thyroperoxidase Ab SerPl-aCnc: 47 IU/mL — ABNORMAL HIGH (ref 0–34)

## 2018-09-27 LAB — TSH: TSH: 1.37 u[IU]/mL (ref 0.450–4.500)

## 2018-09-27 LAB — CALCITRIOL (1,25 DI-OH VIT D): Vit D, 1,25-Dihydroxy: 68.6 pg/mL (ref 19.9–79.3)

## 2018-09-27 LAB — THYROGLOBULIN ANTIBODY: Thyroglobulin Antibody: <1 IU/mL (ref 0.0–0.9)

## 2018-09-27 LAB — T4, FREE: Free T4: 1.23 ng/dL (ref 0.82–1.77)

## 2018-10-08 ENCOUNTER — Ambulatory Visit (HOSPITAL_COMMUNITY)
Admission: RE | Admit: 2018-10-08 | Discharge: 2018-10-08 | Disposition: A | Discharge status: Home / Self Care | Payer: BLUE CROSS/BLUE SHIELD | Source: Ambulatory Visit | Attending: "Endocrinology | Admitting: "Endocrinology

## 2018-10-08 DIAGNOSIS — E041 Nontoxic single thyroid nodule: Secondary | ICD-10-CM | POA: Diagnosis not present

## 2018-10-08 DIAGNOSIS — E049 Nontoxic goiter, unspecified: Secondary | ICD-10-CM | POA: Diagnosis present

## 2018-10-09 ENCOUNTER — Encounter: Payer: Self-pay | Admitting: "Endocrinology

## 2018-10-09 ENCOUNTER — Ambulatory Visit (INDEPENDENT_AMBULATORY_CARE_PROVIDER_SITE_OTHER): Payer: BLUE CROSS/BLUE SHIELD | Admitting: "Endocrinology

## 2018-10-09 VITALS — BP 134/88 | HR 77 | Ht 63.0 in | Wt 176.0 lb

## 2018-10-09 DIAGNOSIS — E038 Other specified hypothyroidism: Secondary | ICD-10-CM

## 2018-10-09 DIAGNOSIS — E063 Autoimmune thyroiditis: Secondary | ICD-10-CM

## 2018-10-09 NOTE — Progress Notes (Signed)
Endocrinology follow up Note                                         10/09/2018, 12:55 PM   Mary [name] is a 52 y.o.-year-old female patient being seen in follow-up for hypothyroidism referred by Merlyn Albert, MD.   Past Medical History:  Diagnosis Date  . Fibromyalgia   . Migraine   . Thyroid disease    Past Surgical History:  Procedure Laterality Date  . TONSILLECTOMY    . TUBAL LIGATION    . uterine ablation     Social History   Socioeconomic History  . Marital status: Married    Spouse name: Not on file  . Number of children: Not on file  . Years of education: Not on file  . Highest education level: Not on file  Occupational History  . Not on file  Social Needs  . Financial resource strain: Not on file  . Food insecurity:    Worry: Not on file    Inability: Not on file  . Transportation needs:    Medical: Not on file    Non-medical: Not on file  Tobacco Use  . Smoking status: Former Smoker    Types: Cigarettes  . Smokeless tobacco: Never Used  . Tobacco comment: quit feb 2016  Substance and Sexual Activity  . Alcohol use: No    Alcohol/week: 0.0 standard drinks  . Drug use: No  . Sexual activity: Yes    Birth control/protection: Surgical  Lifestyle  . Physical activity:    Days per week: Not on file    Minutes per session: Not on file  . Stress: Not on file  Relationships  . Social connections:    Talks on phone: Not on file    Gets together: Not on file    Attends religious service: Not on file    Active member of club or organization: Not on file    Attends meetings of clubs or organizations: Not on file    Relationship status: Not on file  Other Topics Concern  . Not on file  Social History Narrative  . Not on file   Outpatient Encounter Medications as of 10/09/2018  Medication Sig  . acetaminophen (TYLENOL) 325 MG tablet Take 2 tablets (650 mg total)  by mouth every 6 (six) hours as needed for mild pain, moderate pain or headache.  . fexofenadine-pseudoephedrine (ALLEGRA-D ALLERGY & CONGESTION) 180-240 MG per 24 hr tablet Take 1 tablet by mouth daily.  Marland Kitchen FLUoxetine (PROZAC) 20 MG capsule Take 2 daily (Patient taking differently: daily. Take 2 daily)  . ibuprofen (ADVIL,MOTRIN) 800 MG tablet Take 1 tablet (800 mg total) by mouth 3 (three) times daily. Take with food  . levothyroxine (SYNTHROID, LEVOTHROID) 50 MCG tablet TAKE ONE TABLET BY MOUTH ONCE DAILY BEFORE BREAKFAST  . nortriptyline (PAMELOR) 50 MG capsule Take 1 capsule (50 mg total) by mouth at bedtime.  Marland Kitchen  tiZANidine (ZANAFLEX) 4 MG capsule Take 1 capsule (4 mg total) by mouth at bedtime.   No facility-administered encounter medications on file as of 10/09/2018.    ALLERGIES: Allergies  Allergen Reactions  . Bactrim [Sulfamethoxazole-Trimethoprim]     Lips swelling   VACCINATION STATUS:  There is no immunization history on file for this patient.   HPI    Mary [name]  is a patient with the above medical history. she was diagnosed  with hypothyroidism at approximate age of 80 years with lab work preceded by suggestive clinical symptoms.  She is currently on levothyroxine 50 mcg p.o. daily before breakfast.  Her recent previsit labs confirm etiology of hypothyroidism to be Hashimoto's thyroiditis.   She continues to feel better.  She has no new complaints today.     She has ongoing complaints of diffuse body pain, unable to lose weight, fatigue and dry skin.  She has been managed for fibromyalgia.  -She is known to have mild goiter from ultrasound of the thyroid in 2012, her repeat thyroid ultrasound confirms shrinking bilateral thyroid lobes as well as left lobe thyroid nodule shrinking from 2 cm to 1.3 cm.  she reports family history of  thyroid disorders in her mother.  No family history of thyroid cancer.  No history of  radiation therapy to head or neck. No  recent use of iodine supplements.   ROS:  Constitutional:  Steady weight his last visit,  + fatigue, no subjective hyperthermia, no subjective hypothermia Eyes: no blurry vision, no xerophthalmia ENT: no sore throat, no nodules palpated in throat, no dysphagia/odynophagia, no hoarseness Musculoskeletal: no muscle/joint aches Skin: no rashes Neurological: no tremors, no numbness, no tingling, no dizziness Psychiatric: no depression, no anxiety   Physical Exam: BP 134/88   Pulse 77   Ht 5\' 3"  (1.6 m)   Wt 176 lb (79.8 kg)   BMI 31.18 kg/m  Wt Readings from Last 3 Encounters:  10/09/18 176 lb (79.8 kg)  09/26/18 176 lb (79.8 kg)  04/17/18 162 lb 12.8 oz (73.8 kg)    Constitutional: + Obese for height, not in acute distress, normal state of mind Eyes: PERRLA, EOMI, no exophthalmos ENT: moist mucous membranes, + thyromegaly,  no cervical lymphadenopathy Cardiovascular: normal precordial activity, Regular Rate and Rhythm, no Murmur/Rubs/Gallops  Musculoskeletal: no gross deformities, strength intact in all four extremities Skin: moist, warm, no rashes Neurological: no tremor with outstretched hands, Deep tendon reflexes normal in all four extremities.   CMP ( most recent) CMP     Component Value Date/Time   NA 140 04/19/2018 0830   K 5.0 04/19/2018 0830   CL 103 04/19/2018 0830   CO2 25 04/19/2018 0830   GLUCOSE 87 04/19/2018 0830   GLUCOSE 87 12/20/2016 1545   BUN 17 04/19/2018 0830   CREATININE 0.87 04/19/2018 0830   CALCIUM 9.2 04/19/2018 0830   PROT 6.7 04/19/2018 0830   ALBUMIN 4.5 04/19/2018 0830   AST 16 04/19/2018 0830   ALT 18 04/19/2018 0830   ALKPHOS 79 04/19/2018 0830   BILITOT 0.3 04/19/2018 0830   GFRNONAA 78 04/19/2018 0830   GFRAA 90 04/19/2018 0830     Lipid Panel ( most recent) Lipid Panel     Component Value Date/Time   CHOL 206 (H) 04/19/2018 0830   TRIG 101 04/19/2018 0830   HDL 49 04/19/2018 0830   CHOLHDL 4.2 04/19/2018 0830    LDLCALC 137 (H) 04/19/2018 0830       Lab Results  Component Value Date   TSH 1.370 09/26/2018   TSH 1.310 04/19/2018   TSH 1.150 11/29/2016   TSH 1.620 09/25/2015   TSH 0.964 02/18/2013   FREET4 1.23 09/26/2018      ASSESSMENT: 1. Hypothyroidism due to Hashimoto's thyroiditis  PLAN:   -Her previsit thyroid function tests confirm etiology of hypothyroidism to be Hashimoto's thyroiditis.  Her labs are also consistent with appropriate replacement.  She is advised to continue levothyroxine 50 mcg p.o. every morning.  - We discussed about correct intake of levothyroxine, at fasting, with water, separated by at least 30 minutes from breakfast, and separated by more than 4 hours from calcium, iron, multivitamins, acid reflux medications (PPIs). -Patient is made aware of the fact that thyroid hormone replacement is needed for life, dose to be adjusted by periodic monitoring of thyroid function tests.  Her thyroid ultrasound confirms shrinking bilateral thyroid lobes as well as left-sided thyroid nodule shrinking from 2 cm to 1.3 cm.  No intervention necessary at this time.  Return in about 4 months (around 02/08/2019) for Follow up with Pre-visit Labs.  Marquis LunchGebre Navneet Schmuck, MD [facility] [address] [place], [place] [address] Phone: [phone]  Fax: [phone]   10/09/2018, 12:55 PM  This note was partially dictated with voice recognition software. Similar sounding words can be transcribed inadequately or may not  be corrected upon review.

## 2018-11-08 ENCOUNTER — Encounter: Payer: Self-pay | Admitting: Family Medicine

## 2018-12-05 ENCOUNTER — Telehealth: Payer: Self-pay | Admitting: Family Medicine

## 2018-12-05 NOTE — Telephone Encounter (Signed)
Would like [facility] nurse to return call in regards of Medication.

## 2018-12-05 NOTE — Telephone Encounter (Signed)
Contacted patient. Pt states that she would like to come off the nortriptyline and Zanaflex and she would like something under than Prozac. Pt states she takes all 3 of these meds at night and she get indigestion every night. Pt states she definitely needs something for anxiety. Pt states she does needs something for the muscle spasms and to help her relax at night but she does not think she needs to be on both Nortriptyline and Zanaflex. Pt states that the Prozac seems to make her heart flutter and increased heart rate. Pt states she is not having any trouble breathing. Please advise. Thank you

## 2018-12-05 NOTE — Telephone Encounter (Signed)
Complicated, also not seen for eight months, needs ov to discuss options

## 2018-12-05 NOTE — Telephone Encounter (Signed)
Contacted patient and patient transferred up front to schedule appt.

## 2018-12-18 ENCOUNTER — Ambulatory Visit: Payer: BLUE CROSS/BLUE SHIELD | Admitting: Family Medicine

## 2018-12-18 VITALS — BP 126/82 | Ht 63.0 in | Wt 179.0 lb

## 2018-12-18 DIAGNOSIS — M797 Fibromyalgia: Secondary | ICD-10-CM | POA: Diagnosis not present

## 2018-12-18 DIAGNOSIS — E039 Hypothyroidism, unspecified: Secondary | ICD-10-CM

## 2018-12-18 DIAGNOSIS — R079 Chest pain, unspecified: Secondary | ICD-10-CM | POA: Diagnosis not present

## 2018-12-18 DIAGNOSIS — F4323 Adjustment disorder with mixed anxiety and depressed mood: Secondary | ICD-10-CM

## 2018-12-18 MED ORDER — FLUOXETINE HCL 20 MG PO CAPS: ORAL_CAPSULE | ORAL | 4 refills | Status: DC

## 2018-12-18 MED ORDER — CYCLOBENZAPRINE HCL 10 MG PO TABS: 10.0000 mg | ORAL_TABLET | Freq: Every day | ORAL | 5 refills | Status: DC

## 2018-12-18 NOTE — Patient Instructions (Signed)
Palpitations  Palpitations are feelings that your heartbeat is irregular or is faster than normal. It may feel like your heart is fluttering or skipping a beat. Palpitations are usually not a serious problem. They may be caused by many things, including smoking, caffeine, alcohol, stress, and certain medicines or drugs. Most causes of palpitations are not serious. However, some palpitations can be a sign of a serious problem. You may need further tests to rule out serious medical problems.  Follow these instructions at home:         Pay attention to any changes in your condition. Take these actions to help manage your symptoms:  Eating and drinking  Avoid foods and drinks that may cause palpitations. These may include:  Caffeinated coffee, tea, soft drinks, diet pills, and energy drinks.  Chocolate.  Alcohol.  Lifestyle  Take steps to reduce your stress and anxiety. Things that can help you relax include:  Yoga.  Mind-body activities, such as deep breathing, meditation, or using words and images to create positive thoughts (guided imagery).  Physical activity, such as swimming, jogging, or walking. Tell your health care provider if your palpitations increase with activity. If you have chest pain or shortness of breath with activity, do not continue the activity until you are seen by your health care provider.  Biofeedback. This is a method that helps you learn to use your mind to control things in your body, such as your heartbeat.  Do not use drugs, including cocaine or ecstasy. Do not use marijuana.  Get plenty of rest and sleep. Keep a regular bed time.  General instructions  Take over-the-counter and prescription medicines only as told by your health care provider.  Do not use any products that contain nicotine or tobacco, such as cigarettes and e-cigarettes. If you need help quitting, ask your health care provider.  Keep all follow-up visits as told by your health care provider. This is important. These may  include visits for further testing if palpitations do not go away or get worse.  Contact a health care provider if you:  Continue to have a fast or irregular heartbeat after 24 hours.  Notice that your palpitations occur more often.  Get help right away if you:  Have chest pain or shortness of breath.  Have a severe headache.  Feel dizzy or you faint.  Summary  Palpitations are feelings that your heartbeat is irregular or is faster than normal. It may feel like your heart is fluttering or skipping a beat.  Palpitations may be caused by many things, including smoking, caffeine, alcohol, stress, certain medicines, and drugs.  Although most causes of palpitations are not serious, some causes can be a sign of a serious medical problem.  Get help right away if you faint or have chest pain, shortness of breath, a severe headache, or dizziness.  This information is not intended to replace advice given to you by your health care provider. Make sure you discuss any questions you have with your health care provider.  Document Released: 10/07/2000 Document Revised: 11/22/2017 Document Reviewed: 11/22/2017  [employer]vier Interactive Patient Education  2019 [employer]

## 2018-12-18 NOTE — Progress Notes (Signed)
   Subjective:    Patient ID: Mary [name], female    DOB: [birth year], 53 y.o.   MRN: [id number]  HPI  Patient arrives to discuss her nortriptyline and tizanidine.   Patient states she does not feel the muscle relaxer is helping and thinks she may need a different dose and wonders if she needs to continue to take the meds together.   Is on cbd oil, seems to be helping some, now on s vitamin with tumeric and it helps some Most uncomfortale in the morn  ocurw with sitting all day   Taking one prozac takes one per day  Pt finds herself more forgetful these days  Notes considerale worry about these things  Patient also notes intermittent palpitations.  These concern her.  Not associated with exertion.  Patient had forgotten she has had a full cardiac work-up for these several years ago.  No exertional chest pain.  Wonders if her palpitations are associated with her thyroid disease  Does feel quite a bit of stress and stays.      Dr Fransico Him diagnosed with Hashimoto Thyroid.  On treatment for this. Review of Systems No headache, no major weight loss or weight gain, no chest pain no back pain abdominal pain no change in bowel habits complete ROS otherwise negative     Objective:   Physical Exam  Alert and oriented, vitals reviewed and stable, NAD ENT-TM's and ext canals WNL bilat via otoscopic exam Soft palate, tonsils and post pharynx WNL via oropharyngeal exam Neck-symmetric, no masses; thyroid nonpalpable and nontender Pulmonary-no tachypnea or accessory muscle use; Clear without wheezes via auscultation Card--no abnrml murmurs, rhythm reg and rate WNL Carotid pulses symmetric, without bruits  EKG.  Normal sinus rhythm.  No significant ST-T changes      Assessment & Plan:  Impression palpitations long discussion held.  High likelihood intermittent PVCs.  No exertional component.  Symptom care discussed.  At this time patient elects to hold off on another cardiology  referral and I think this is appropriate  2.  Chronic depression element of anxiety.  Ongoing.  Review of notes show the patient had been encouraged to increase her Prozac to they did not do this and she will do this now.  3.  Fibromyalgia.  Ongoing.  With perceived side effects from current medications.  Dry mouth with nortriptyline.  Zanaflex not helping spasm much.  Options discussed only.  We will switch to Flexeril nightly.  With increase of Prozac to 1 to 2-day will hold off of nortriptyline because of the sedating effect of Flexeril  Follow-up as scheduled exercise encouraged thyroid disease discussed  Greater than 50% of this 40 minute face to face visit was spent in counseling and discussion and coordination of care regarding the above diagnosis/diagnosies

## 2018-12-20 ENCOUNTER — Encounter: Payer: Self-pay | Admitting: Family Medicine

## 2019-01-02 ENCOUNTER — Other Ambulatory Visit: Payer: Self-pay | Admitting: Family Medicine

## 2019-01-03 ENCOUNTER — Other Ambulatory Visit: Payer: Self-pay

## 2019-01-03 ENCOUNTER — Telehealth: Payer: Self-pay

## 2019-01-03 DIAGNOSIS — E038 Other specified hypothyroidism: Secondary | ICD-10-CM

## 2019-01-03 DIAGNOSIS — E063 Autoimmune thyroiditis: Principal | ICD-10-CM

## 2019-01-03 MED ORDER — LEVOTHYROXINE SODIUM 50 MCG PO TABS: ORAL_TABLET | ORAL | 0 refills | Status: DC

## 2019-01-03 NOTE — Telephone Encounter (Signed)
LeighAnn Lucero Auzenne, CMA  

## 2019-02-12 ENCOUNTER — Ambulatory Visit (INDEPENDENT_AMBULATORY_CARE_PROVIDER_SITE_OTHER): Payer: BLUE CROSS/BLUE SHIELD | Admitting: Family Medicine

## 2019-02-12 ENCOUNTER — Ambulatory Visit: Payer: BLUE CROSS/BLUE SHIELD | Admitting: "Endocrinology

## 2019-02-12 ENCOUNTER — Other Ambulatory Visit: Payer: Self-pay

## 2019-02-12 ENCOUNTER — Telehealth: Payer: Self-pay

## 2019-02-12 DIAGNOSIS — S93401A Sprain of unspecified ligament of right ankle, initial encounter: Secondary | ICD-10-CM

## 2019-02-12 DIAGNOSIS — S0003XA Contusion of scalp, initial encounter: Secondary | ICD-10-CM

## 2019-02-12 DIAGNOSIS — E038 Other specified hypothyroidism: Secondary | ICD-10-CM

## 2019-02-12 DIAGNOSIS — E063 Autoimmune thyroiditis: Principal | ICD-10-CM

## 2019-02-12 NOTE — Telephone Encounter (Signed)
LeighAnn Jnyah Brazee, CMA  

## 2019-02-12 NOTE — Progress Notes (Addendum)
   Subjective:  Audio and visual Patient at home we were at the office Coronavirus outbreak  Patient ID: Mary [name], female    DOB: 1966/03/23, 53 y.o.   MRN: [id number]  HPIHit head on Saturday. Her right foot buckled and she did catch herself with her arm so she does not thinks she hit her head that hard. She did hit right side of head around temple and ear on a ceramic flower pot. Today it is a little sore. She did not pass out, no bleeding, no vomiting. Did have some dizziness this morning. Had blood drawn this morning for her thyroid level. When she got home she felt lightheaded. No headaches.   Patient states that she was able to see her fall she states they have a system that videotapes She states her ankle twisted when she was shoveling and she fell to the side hitting her right side of her head on ceramic pot-she did states she saw stars but denied being knocked out denied any double vision nausea vomiting denied unilateral numbness weakness or ataxia.  Had a little bit of dizziness this morning.  No other particular troubles. Virtual Visit via Video Note  I connected with Mary [name] on 02/12/19 at 11:00 AM EDT by a video enabled telemedicine application and verified that I am speaking with the correct person using two identifiers.   I discussed the limitations of evaluation and management by telemedicine and the availability of in person appointments. The patient expressed understanding and agreed to proceed.  History of Present Illness:    Observations/Objective:   Assessment and Plan:   Follow Up Instructions:    I discussed the assessment and treatment plan with the patient. The patient was provided an opportunity to ask questions and all were answered. The patient agreed with the plan and demonstrated an understanding of the instructions.   The patient was advised to call back or seek an in-person evaluation if the symptoms worsen or if the condition  fails to improve as anticipated.  I provided 17 minutes of non-face-to-face time during this encounter.  She did state that after the injury she noticed some soreness at that radiated into the calf but no swelling patient was cautioned that if this area becomes worse and start swelling we will need to see her and evaluate her for DVT but at this point time more than likely is associated with the ankle sprain      Review of Systems     Objective:   Physical Exam  Patient is able to close her eyes and hold her balance She is able to stand and go finger-to-nose right side left side without difficulty      Assessment & Plan:  Head contusion she does not have any red flags I do not recommend any type of MRI or CT scan at this point I doubt she has a subdural hematoma She was told that if she starts having double vision blurred vision severe pain vomiting or wakes her up in the middle of the night she needs to immediately get checked and/or notify us  Right ankle pain discomfort with bruising she states she is able to walk on it now without any significant deficits so therefore gentle range of motion exercises toe raises should gradually get better follow-up if persistent trouble

## 2019-02-13 LAB — T4, FREE: Free T4: 1.25 ng/dL (ref 0.82–1.77)

## 2019-02-13 LAB — TSH: TSH: 2.26 u[IU]/mL (ref 0.450–4.500)

## 2019-02-14 ENCOUNTER — Encounter: Payer: Self-pay | Admitting: "Endocrinology

## 2019-02-14 ENCOUNTER — Ambulatory Visit (INDEPENDENT_AMBULATORY_CARE_PROVIDER_SITE_OTHER): Payer: BLUE CROSS/BLUE SHIELD | Admitting: "Endocrinology

## 2019-02-14 ENCOUNTER — Other Ambulatory Visit: Payer: Self-pay

## 2019-02-14 DIAGNOSIS — E063 Autoimmune thyroiditis: Secondary | ICD-10-CM

## 2019-02-14 DIAGNOSIS — E782 Mixed hyperlipidemia: Secondary | ICD-10-CM

## 2019-02-14 DIAGNOSIS — E038 Other specified hypothyroidism: Secondary | ICD-10-CM | POA: Diagnosis not present

## 2019-02-14 MED ORDER — ROSUVASTATIN CALCIUM 5 MG PO TABS: 5.0000 mg | ORAL_TABLET | Freq: Every day | ORAL | 3 refills | Status: DC

## 2019-02-14 MED ORDER — LEVOTHYROXINE SODIUM 50 MCG PO TABS: ORAL_TABLET | ORAL | 0 refills | Status: DC

## 2019-02-14 NOTE — Progress Notes (Signed)
02/14/2019, 3:57 PM    Endocrinology Telehealth Visit Follow up Note -During COVID -19 Pandemic   Mary [name] is a 53 y.o.-year-old female patient being engaged in telehealth for follow-up of hypothyroidism.  She also has significant hyperlipidemia not on treatment.     Past Medical History:  Diagnosis Date  . Fibromyalgia   . Migraine   . Thyroid disease    Past Surgical History:  Procedure Laterality Date  . TONSILLECTOMY    . TUBAL LIGATION    . uterine ablation     Social History   Socioeconomic History  . Marital status: Married    Spouse name: Not on file  . Number of children: Not on file  . Years of education: Not on file  . Highest education level: Not on file  Occupational History  . Not on file  Social Needs  . Financial resource strain: Not on file  . Food insecurity:    Worry: Not on file    Inability: Not on file  . Transportation needs:    Medical: Not on file    Non-medical: Not on file  Tobacco Use  . Smoking status: Former Smoker    Types: Cigarettes  . Smokeless tobacco: Never Used  . Tobacco comment: quit feb 2016  Substance and Sexual Activity  . Alcohol use: No    Alcohol/week: 0.0 standard drinks  . Drug use: No  . Sexual activity: Yes    Birth control/protection: Surgical  Lifestyle  . Physical activity:    Days per week: Not on file    Minutes per session: Not on file  . Stress: Not on file  Relationships  . Social connections:    Talks on phone: Not on file    Gets together: Not on file    Attends religious service: Not on file    Active member of club or organization: Not on file    Attends meetings of clubs or organizations: Not on file    Relationship status: Not on file  Other Topics Concern  . Not on file  Social History Narrative  . Not on file   Outpatient Encounter Medications as of 02/14/2019   Medication Sig  . acetaminophen (TYLENOL) 325 MG tablet Take 2 tablets (650 mg total) by mouth every 6 (six) hours as needed for mild pain, moderate pain or headache.  . cyclobenzaprine (FLEXERIL) 10 MG tablet Take 1 tablet (10 mg total) by mouth at bedtime.  . fexofenadine-pseudoephedrine (ALLEGRA-D ALLERGY & CONGESTION) 180-240 MG per 24 hr tablet Take 1 tablet by mouth daily.  Marland Kitchen FLUoxetine (PROZAC) 20 MG capsule Take 2 daily  . ibuprofen (ADVIL,MOTRIN) 800 MG tablet Take 1 tablet (800 mg total) by mouth 3 (three) times daily. Take with food  . levothyroxine (SYNTHROID) 50 MCG tablet TAKE 1 TABLET BY  MOUTH ONCE DAILY BEFORE BREAKFAST  . rosuvastatin (CRESTOR) 5 MG tablet Take 1 tablet (5 mg total) by mouth daily.  . [DISCONTINUED] levothyroxine (SYNTHROID, LEVOTHROID) 50 MCG tablet TAKE 1 TABLET BY MOUTH ONCE DAILY BEFORE BREAKFAST   No facility-administered encounter medications on file as of 02/14/2019.    ALLERGIES: Allergies  Allergen Reactions  . Bactrim [Sulfamethoxazole-Trimethoprim]     Lips swelling   VACCINATION STATUS:  There is no immunization history on file for this patient.   HPI    Mary [name]  is a patient with the above medical history. she was diagnosed  with hypothyroidism at approximate age of 47 years with lab work preceded by suggestive clinical symptoms.  She is currently on levothyroxine 50 mcg p.o. daily before breakfast.  Her recent previsit labs confirm etiology of hypothyroidism to be Hashimoto's thyroiditis.   She continues to feel better.  She is no new complaints today.    She has ongoing complaints of diffuse body pain, unable to lose weight, fatigue and dry skin.  She has been managed for fibromyalgia.  -She is known to have mild goiter from ultrasound of the thyroid in 2012, her repeat thyroid ultrasound confirms shrinking bilateral thyroid lobes as well as left lobe thyroid nodule shrinking from 2 cm to 1.3 cm.  she reports family  history of  thyroid disorders in her mother.  No family history of thyroid cancer.  No history of  radiation therapy to head or neck. No recent use of iodine supplements.   ROS: Limited as above. Physical Exam: There were no vitals taken for this visit. Wt Readings from Last 3 Encounters:  12/18/18 179 lb (81.2 kg)  10/09/18 176 lb (79.8 kg)  09/26/18 176 lb (79.8 kg)     CMP ( most recent) CMP     Component Value Date/Time   NA 140 04/19/2018 0830   K 5.0 04/19/2018 0830   CL 103 04/19/2018 0830   CO2 25 04/19/2018 0830   GLUCOSE 87 04/19/2018 0830   GLUCOSE 87 12/20/2016 1545   BUN 17 04/19/2018 0830   CREATININE 0.87 04/19/2018 0830   CALCIUM 9.2 04/19/2018 0830   PROT 6.7 04/19/2018 0830   ALBUMIN 4.5 04/19/2018 0830   AST 16 04/19/2018 0830   ALT 18 04/19/2018 0830   ALKPHOS 79 04/19/2018 0830   BILITOT 0.3 04/19/2018 0830   GFRNONAA 78 04/19/2018 0830   GFRAA 90 04/19/2018 0830     Lipid Panel ( most recent) Lipid Panel     Component Value Date/Time   CHOL 206 (H) 04/19/2018 0830   TRIG 101 04/19/2018 0830   HDL 49 04/19/2018 0830   CHOLHDL 4.2 04/19/2018 0830   LDLCALC 137 (H) 04/19/2018 0830       Lab Results  Component Value Date   TSH 2.260 02/12/2019   TSH 1.370 09/26/2018   TSH 1.310 04/19/2018   TSH 1.150 11/29/2016   TSH 1.620 09/25/2015   TSH 0.964 02/18/2013   FREET4 1.25 02/12/2019   FREET4 1.23 09/26/2018      ASSESSMENT: 1. Hypothyroidism due to Hashimoto's thyroiditis 2.  Hyperlipidemia  PLAN:   -Her Indicated etiology of hypothyroidism is Hashimoto's thyroiditis in her case.  Her previsit labs are consistent with appropriate replacement.  She is advised to continue levothyroxine 50 mcg p.o. daily before breakfast.     - We discussed about the correct intake of her thyroid hormone, on empty stomach at fasting, with water, separated by at least 30  minutes from breakfast and other medications,  and separated by more than 4  hours from calcium, iron, multivitamins, acid reflux medications (PPIs). -Patient is made aware of the fact that thyroid hormone replacement is needed for life, dose to be adjusted by periodic monitoring of thyroid function tests.   Her thyroid ultrasound confirms shrinking bilateral thyroid lobes as well as left-sided thyroid nodule shrinking from 2 cm to 1.3 cm.  No intervention necessary at this time.  For her severe hyperlipidemia, she is approached for treatment with statins and she reluctantly accepts treatment.  I discussed and initiated Crestor 5 mg p.o. nightly for her.  Side effects and precautions discussed with her.  Time for visit 15 minutes.  Mary [name] participated in the discussions, expressed understanding, and voiced agreement with the above plans.  All questions were answered to her satisfaction. she is encouraged to contact clinic should she have any questions or concerns prior to her return visit.   Return in about 6 months (around 08/16/2019) for Follow up with Pre-visit Labs.  Marquis LunchGebre Valina Maes, MD [facility] [address] [place], [place] [address] Phone: [phone]  Fax: [phone]   02/14/2019, 3:57 PM  This note was partially dictated with voice recognition software. Similar sounding words can be transcribed inadequately or may not  be corrected upon review.

## 2019-02-20 ENCOUNTER — Telehealth: Payer: Self-pay | Admitting: Rheumatology

## 2019-02-20 ENCOUNTER — Ambulatory Visit (INDEPENDENT_AMBULATORY_CARE_PROVIDER_SITE_OTHER): Payer: BLUE CROSS/BLUE SHIELD | Admitting: Family Medicine

## 2019-02-20 ENCOUNTER — Other Ambulatory Visit: Payer: Self-pay

## 2019-02-20 ENCOUNTER — Encounter: Payer: Self-pay | Admitting: Family Medicine

## 2019-02-20 ENCOUNTER — Telehealth: Payer: Self-pay | Admitting: *Deleted

## 2019-02-20 DIAGNOSIS — M797 Fibromyalgia: Secondary | ICD-10-CM | POA: Diagnosis not present

## 2019-02-20 DIAGNOSIS — F4323 Adjustment disorder with mixed anxiety and depressed mood: Secondary | ICD-10-CM

## 2019-02-20 MED ORDER — TIZANIDINE HCL 4 MG PO TABS: ORAL_TABLET | ORAL | 5 refills | Status: DC

## 2019-02-20 MED ORDER — FLUOXETINE HCL 20 MG PO CAPS: ORAL_CAPSULE | ORAL | 5 refills | Status: DC

## 2019-02-20 NOTE — Progress Notes (Signed)
   Subjective:    Patient ID: Mary [name], female    DOB: 12-29-1965, 53 y.o.   MRN: [id number]  HPI    Review of Systems     Objective:   Physical Exam        Assessment & Plan:

## 2019-02-20 NOTE — Telephone Encounter (Signed)
Patient advised that she could try reaching out to her PCP for treat of her Fibromyalgia. Patient would like to know if you have any other recommendations?

## 2019-02-20 NOTE — Telephone Encounter (Signed)
Tizanidine is zanaflex. Pt told me that instead of tramadol she had been on zanaflex and that helped. So that's what we called in. am I missing something?

## 2019-02-20 NOTE — Progress Notes (Signed)
   Subjective:    Patient ID: Mary [name], female    DOB: [birth year], 53 y.o.   MRN: [id number] Format - video  Patient present at home Provider present at office Consent for interaction obtained Coronavirus outbreak made virtual visit necessary  Back Pain  This is a chronic problem.  has fibromyalgia. wanted to come off of tramadol and was put on flexeril and pt states she does not sleep like she did when she was on tramadol. Pain down shoulders, back and hip have been unbearable.   Virtual Visit via Video Note  I connected with [name] on 02/20/19 at  2:00 PM EDT by a video enabled telemedicine application and verified that I am speaking with the correct person using two identifiers.   I discussed the limitations of evaluation and management by telemedicine and the availability of in person appointments. The patient expressed understanding and agreed to proceed.  History of Present Illness:    Observations/Objective:   Assessment and Plan:   Follow Up Instructions:    I discussed the assessment and treatment plan with the patient. The patient was provided an opportunity to ask questions and all were answered. The patient agreed with the plan and demonstrated an understanding of the instructions.   The patient was advised to call back or seek an in-person evaluation if the symptoms worsen or if the condition fails to improve as anticipated.  I provided 25 minutes of non-face-to-face time during this encounter.  Patient notes ongoing compliance with antidepressant medication. No obvious side effects. Reports does not miss a dose. Overall continues to help depression substantially. No thoughts of homicide or suicide. Would like to maintain medication. Patient having ongoing challenges with her fibromyalgia.  Her current rheumatologist will no longer see her for this.   the rheumatologist claimed it was because she was a new patient again, however I reminded  patient that most rheumatologists are now no longer caring for fibromyalgia disorder, though they are the ones who created the diagnosis.  Patient now on Flexeril nightly.  Reports is not helping as much.  Reports the Zanaflex helped more in the past.  Wonders if she can take some during the day.  Exercises fair but not also  Seen thyroid specialist   Review of Systems  Musculoskeletal: Positive for back pain.       Objective:   Physical Exam  Virtual visit      Assessment & Plan:  Impression fibromyalgia.  Discussed at great length.  Ashby Dawes of the condition discussed.  The fact that rheumatologist were actually the ones to discover this and declared it as a diagnosis, but mostly are no longer caring for patients with it, was discussed with the patient.  Flexeril not helping as much.  We will go back to tizanidine or Zanaflex.  Also will allow dose during the daytime but at one half dose.  Exercise encouraged  2.  Depression ongoing need for medications discussed  Follow-up in 6 months  Greater than 50% of this 25 minute face to face visit was spent in counseling and discussion and coordination of care regarding the above diagnosis/diagnosies

## 2019-02-20 NOTE — Telephone Encounter (Signed)
Pt seen today and pt told me when I checked her in that she was on tramadol and she called back and states it was not tramadol it was zanaflex. She was changed to flexeril from tizanidine. And this med was sent was sent in today. Pt would like to know if this could be switched.   [facility] [place]

## 2019-02-20 NOTE — Telephone Encounter (Signed)
Patient saw Dr. Corliss Skains back 5-6 years ago. Dr. Corliss Skains could not take back due to patient having Fibromyalgia. Patient having a hard time finding a doctor to treat her for Fibro, and would like to know if Dr. Corliss Skains could recommend someone to treat her. Please call to advise.

## 2019-02-20 NOTE — Telephone Encounter (Signed)
Discussed with pt. That the zanaflex that she said helped was the med that was sent in and she verbalized understanding.

## 2019-02-21 NOTE — Telephone Encounter (Signed)
Patient advised Dr. Corliss Skains does not know anyone lese who treats FMS besides PCP.

## 2019-02-21 NOTE — Telephone Encounter (Signed)
I do not know anyone besides PCPs who treat FMS.

## 2019-03-07 ENCOUNTER — Ambulatory Visit (INDEPENDENT_AMBULATORY_CARE_PROVIDER_SITE_OTHER): Payer: BLUE CROSS/BLUE SHIELD | Admitting: Family Medicine

## 2019-03-07 ENCOUNTER — Other Ambulatory Visit: Payer: Self-pay

## 2019-03-07 ENCOUNTER — Encounter: Payer: Self-pay | Admitting: Family Medicine

## 2019-03-07 DIAGNOSIS — J310 Chronic rhinitis: Secondary | ICD-10-CM

## 2019-03-07 DIAGNOSIS — J329 Chronic sinusitis, unspecified: Secondary | ICD-10-CM

## 2019-03-07 MED ORDER — AMOXICILLIN-POT CLAVULANATE 875-125 MG PO TABS: 1.0000 | ORAL_TABLET | Freq: Two times a day (BID) | ORAL | 0 refills | Status: AC

## 2019-03-07 NOTE — Progress Notes (Signed)
   Subjective:    Patient ID: Mary [name], female    DOB: [birth year], 53 y.o.   MRN: [id number] Audio plus visual Sinus Problem  This is a new problem. The current episode started in the past 7 days. Associated symptoms include coughing, ear pain, headaches and sinus pressure. (Congestion with green color) Treatments tried: zyrtec.   Frontal headache.  Worse with change in position.  Positive gunky discharge.  Progressive over 2 weeks.  No chest symptoms   Review of Systems  HENT: Positive for ear pain and sinus pressure.   Respiratory: Positive for cough.   Neurological: Positive for headaches.    Virtual Visit via Video Note  I connected with Mary [name] on 03/07/19 at  2:00 PM EDT by a video enabled telemedicine application and verified that I am speaking with the correct person using two identifiers.  Location: Patient: home Provider: office   I discussed the limitations of evaluation and management by telemedicine and the availability of in person appointments. The patient expressed understanding and agreed to proceed.  History of Present Illness:    Observations/Objective:   Assessment and Plan:   Follow Up Instructions:    I discussed the assessment and treatment plan with the patient. The patient was provided an opportunity to ask questions and all were answered. The patient agreed with the plan and demonstrated an understanding of the instructions.   The patient was advised to call back or seek an in-person evaluation if the symptoms worsen or if the condition fails to improve as anticipated.  I provided of non-face-to-face time during this encounter.         Objective:   Physical Exam   Virtual    Assessment & Plan:  Impression rhinosinusitis likely post viral and/or post allergenic, discussed with patient. plan antibiotics prescribed. Questions answered. Symptomatic care discussed. warning signs discussed. WSL

## 2019-03-19 ENCOUNTER — Emergency Department (HOSPITAL_COMMUNITY)
Admission: EM | Admit: 2019-03-19 | Discharge: 2019-03-19 | Disposition: A | Discharge status: Home / Self Care | Payer: BLUE CROSS/BLUE SHIELD | Attending: Emergency Medicine | Admitting: Emergency Medicine

## 2019-03-19 ENCOUNTER — Ambulatory Visit: Payer: BLUE CROSS/BLUE SHIELD | Admitting: Family Medicine

## 2019-03-19 ENCOUNTER — Encounter (HOSPITAL_COMMUNITY): Payer: Self-pay

## 2019-03-19 ENCOUNTER — Other Ambulatory Visit: Payer: Self-pay

## 2019-03-19 DIAGNOSIS — Z79899 Other long term (current) drug therapy: Secondary | ICD-10-CM | POA: Diagnosis not present

## 2019-03-19 DIAGNOSIS — Z87891 Personal history of nicotine dependence: Secondary | ICD-10-CM | POA: Diagnosis not present

## 2019-03-19 DIAGNOSIS — L299 Pruritus, unspecified: Secondary | ICD-10-CM | POA: Insufficient documentation

## 2019-03-19 DIAGNOSIS — T7840XA Allergy, unspecified, initial encounter: Secondary | ICD-10-CM | POA: Diagnosis present

## 2019-03-19 LAB — CBC WITH DIFFERENTIAL/PLATELET
Abs Immature Granulocytes: 0.01 10*3/uL (ref 0.00–0.07)
Basophils Absolute: 0 10*3/uL (ref 0.0–0.1)
Basophils Relative: 0 %
Eosinophils Absolute: 0.1 10*3/uL (ref 0.0–0.5)
Eosinophils Relative: 1 %
HCT: 40.7 % (ref 36.0–46.0)
Hemoglobin: 13.5 g/dL (ref 12.0–15.0)
Immature Granulocytes: 0 %
Lymphocytes Relative: 30 %
Lymphs Abs: 2.1 10*3/uL (ref 0.7–4.0)
MCH: 31.8 pg (ref 26.0–34.0)
MCHC: 33.2 g/dL (ref 30.0–36.0)
MCV: 95.8 fL (ref 80.0–100.0)
Monocytes Absolute: 0.5 10*3/uL (ref 0.1–1.0)
Monocytes Relative: 7 %
Neutro Abs: 4.3 10*3/uL (ref 1.7–7.7)
Neutrophils Relative %: 62 %
Platelets: 342 10*3/uL (ref 150–400)
RBC: 4.25 MIL/uL (ref 3.87–5.11)
RDW: 12 % (ref 11.5–15.5)
WBC: 6.9 10*3/uL (ref 4.0–10.5)
nRBC: 0 % (ref 0.0–0.2)

## 2019-03-19 LAB — BASIC METABOLIC PANEL
Anion gap: 12 (ref 5–15)
BUN: 13 mg/dL (ref 6–20)
CO2: 25 mmol/L (ref 22–32)
Calcium: 9 mg/dL (ref 8.9–10.3)
Chloride: 105 mmol/L (ref 98–111)
Creatinine, Ser: 0.66 mg/dL (ref 0.44–1.00)
GFR calc Af Amer: >60 mL/min (ref >60)
GFR calc non Af Amer: >60 mL/min (ref >60)
Glucose, Bld: 74 mg/dL (ref 70–99)
Potassium: 3.6 mmol/L (ref 3.5–5.1)
Sodium: 142 mmol/L (ref 135–145)

## 2019-03-19 MED ORDER — FAMOTIDINE 20 MG PO TABS: 20.0000 mg | ORAL_TABLET | Freq: Two times a day (BID) | ORAL | 0 refills | Status: DC

## 2019-03-19 MED ORDER — FAMOTIDINE IN NACL 20-0.9 MG/50ML-% IV SOLN
20.0000 mg | Freq: Once | INTRAVENOUS | Status: AC
  Administered 2019-03-19: 20 mg via INTRAVENOUS
  Filled 2019-03-19: qty 50

## 2019-03-19 MED ORDER — DEXAMETHASONE SODIUM PHOSPHATE 10 MG/ML IJ SOLN
10.0000 mg | Freq: Once | INTRAMUSCULAR | Status: AC
  Administered 2019-03-19: 10 mg via INTRAVENOUS
  Filled 2019-03-19: qty 1

## 2019-03-19 MED ORDER — DIPHENHYDRAMINE HCL 50 MG/ML IJ SOLN
25.0000 mg | Freq: Once | INTRAMUSCULAR | Status: AC
  Administered 2019-03-19: 25 mg via INTRAVENOUS
  Filled 2019-03-19: qty 1

## 2019-03-19 MED ORDER — DIPHENHYDRAMINE HCL 25 MG PO TABS: 25.0000 mg | ORAL_TABLET | Freq: Four times a day (QID) | ORAL | 0 refills | Status: DC

## 2019-03-19 MED ORDER — PREDNISONE 10 MG PO TABS: ORAL_TABLET | ORAL | 0 refills | Status: DC

## 2019-03-19 NOTE — ED Notes (Signed)
ED Provider Raynelle Fanning, PA) at bedside.

## 2019-03-19 NOTE — Discharge Instructions (Addendum)
Take your next dose of prednisone tomorrow as you have received todays dose here.  Take benadryl every 6 hours and 1 more dose of pepcid before bed tonight.  Avoid taking any additional calcium supplementation and the antibiotic until further advised by your doctor. It is unclear if either of these is a the source of your symptoms but it is possible. You have been taken out of work for the next 3 days, mostly as you will need to continue taking benadryl until your symptoms are completely resolved.

## 2019-03-19 NOTE — ED Triage Notes (Addendum)
Pt reports rash started Sunday right inner thigh and now both ears red and and swollen . Right ear worse than left. Has rash along hairline. Started prednisone 20 mg this am  Called in by PCP. Feels like she is having difficulty breathing. States she was in pool on Saturday and was bit by bug on right abdomen unsure if this has anything to do with this

## 2019-03-19 NOTE — ED Provider Notes (Signed)
[facility] EMERGENCY DEPARTMENT Provider Note   CSN: [id number] Arrival date & time: 03/19/19  1028    History   Chief Complaint Chief Complaint  Patient presents with  . Allergic Reaction    HPI Mary [name] is a 53 y.o. female with a history of fibromyalgia, migraines and hypothyroidism presenting with a 2 day history of pruritic rash on her bilateral inner thighs, then woke at 4 am today with bilateral ear swelling, redness and burning pain in addition to itching, now also feeling short of breath.  She denies any new obvious exposures to foods, soaps, lotions, etc but started taking an otc calcium supplementation last week to help with newly diagnosed osteopenia. She spent time in her backyard this weekend with her grandchild swimming in a blow up kiddie pool.  She was bit by an insect on her abdomen but denies any sx at this site.  She denies fevers, chills, n/v, abdominal pain, has had no cough, wheezing, also no complaint of facial, mouth, tongue or throat swelling but has vague sensation of sob.  She has taken benadryl for the past 2 days with some improvement which makes her quite drowsy.  She was started on prednisone this morning (40 mg) per tele visit with a service provided by her insurance, no improvement in sx.       The history is provided by the patient.    Past Medical History:  Diagnosis Date  . Fibromyalgia   . Migraine   . Thyroid disease     Patient Active Problem List   Diagnosis Date Noted  . Mixed hyperlipidemia 02/14/2019  . Hypothyroidism 09/26/2018  . Goiter 09/26/2018  . Palpitations 09/25/2015  . Adjustment disorder with mixed anxiety and depressed mood 08/03/2014  . Fibromyalgia affecting multiple sites 05/28/2014    Past Surgical History:  Procedure Laterality Date  . TONSILLECTOMY    . TUBAL LIGATION    . uterine ablation       OB History   No obstetric history on file.      Home Medications    Prior to Admission  medications   Medication Sig Start Date End Date Taking? Authorizing Provider  acetaminophen (TYLENOL) 325 MG tablet Take 2 tablets (650 mg total) by mouth every 6 (six) hours as needed for mild pain, moderate pain or headache. 08/30/16  Yes Everlene Farrier, PA-C  FLUoxetine (PROZAC) 20 MG capsule Take 2 daily 02/20/19  Yes Merlyn Albert, MD  levothyroxine (SYNTHROID) 50 MCG tablet TAKE 1 TABLET BY MOUTH ONCE DAILY BEFORE BREAKFAST 02/14/19  Yes Nida, Denman George, MD  rosuvastatin (CRESTOR) 5 MG tablet Take 1 tablet (5 mg total) by mouth daily. 02/14/19  Yes Roma Kayser, MD  tiZANidine (ZANAFLEX) 4 MG tablet Take one half tablet qam, one half tablet in the evening and one whole tablet qhs 02/20/19  Yes Merlyn Albert, MD  diphenhydrAMINE (BENADRYL) 25 MG tablet Take 1 tablet (25 mg total) by mouth every 6 (six) hours. 03/19/19   Burgess Amor, PA-C  famotidine (PEPCID) 20 MG tablet Take 1 tablet (20 mg total) by mouth 2 (two) times daily. 03/19/19   Izaias Krupka, Raynelle Fanning, PA-C  predniSONE (DELTASONE) 10 MG tablet Take 6 tabs daily by mouth for 1 day,  Then 5 tabs daily for 2 days,  4 tabs daily for 2 days,  3 tabs daily for 2 days,  2 tabs daily for 2 days,  Then 1 tab daily for 2 days. 03/19/19   Dasan Hardman,  Fidela Juneau    Family History Family History  Problem Relation Age of Onset  . Heart disease Mother   . Heart disease Father     Social History Social History   Tobacco Use  . Smoking status: Former Smoker    Types: Cigarettes  . Smokeless tobacco: Never Used  . Tobacco comment: quit feb 2016  Substance Use Topics  . Alcohol use: No    Alcohol/week: 0.0 standard drinks  . Drug use: No     Allergies   Augmentin [amoxicillin-pot clavulanate] and Bactrim [sulfamethoxazole-trimethoprim]   Review of Systems Review of Systems  Constitutional: Negative for chills and fever.  HENT: Negative for congestion and sore throat.   Eyes: Negative.   Respiratory: Positive for shortness of  breath. Negative for chest tightness, wheezing and stridor.   Cardiovascular: Negative for chest pain.  Gastrointestinal: Negative for abdominal pain, nausea and vomiting.  Genitourinary: Negative.   Musculoskeletal: Negative for arthralgias, joint swelling and neck pain.  Skin: Positive for color change and rash. Negative for wound.  Neurological: Negative for dizziness, weakness, light-headedness, numbness and headaches.  Psychiatric/Behavioral: Negative.      Physical Exam Updated Vital Signs BP 119/67   Pulse 76   Temp 98 F (36.7 C) (Oral)   Resp 18   Ht  (1.6 m) Comment: Simultaneous filing. User may not have seen previous data.  Wt 77.1 kg Comment: Simultaneous filing. User may not have seen previous data.  SpO2 96%   BMI 30.11 kg/m   Physical Exam Constitutional:      General: She is not in acute distress.    Appearance: She is well-developed.  HENT:     Head: Normocephalic. Hair is normal.     Comments: Scalp without erythema or rash, small area of erythema posterior neck. Neck:     Musculoskeletal: Neck supple.  Cardiovascular:     Rate and Rhythm: Normal rate.  Pulmonary:     Effort: Pulmonary effort is normal. No respiratory distress.     Breath sounds: No stridor. No wheezing or rhonchi.  Abdominal:     General: Bowel sounds are normal. There is no distension.     Palpations: Abdomen is soft.  Musculoskeletal: Normal range of motion.  Lymphadenopathy:     Cervical: No cervical adenopathy.  Skin:    Capillary Refill: Capillary refill takes less than 2 seconds.     Findings: Rash present.     Comments: Localized macular faintly erythematous rash bilateral inner thighs. No vesicles, no drainage. Right > left ear erythema with right ear also with mild edema. No rash or lesions.       ED Treatments / Results  Labs (all labs ordered are listed, but only abnormal results are displayed) Labs Reviewed  CBC WITH DIFFERENTIAL/PLATELET  BASIC METABOLIC  PANEL    EKG None  Radiology No results found.  Procedures Procedures (including critical care time)  Medications Ordered in ED Medications  dexamethasone (DECADRON) injection 10 mg (10 mg Intravenous Given 03/19/19 1158)  famotidine (PEPCID) IVPB 20 mg premix ( Intravenous Stopped 03/19/19 1231)  diphenhydrAMINE (BENADRYL) injection 25 mg (25 mg Intravenous Given 03/19/19 1158)     Initial Impression / Assessment and Plan / ED Course  I have reviewed the triage vital signs and the nursing notes.  Pertinent labs & imaging results that were available during my care of the patient were reviewed by me and considered in my medical decision making (see chart for details).  5:02 PM Prior to dc, pt also remembers was started on augmentin on 5/14 for a sinus infection, last dose taken 3 days ago. Also advised to avoid this medicine.   Pt with allergic reaction, possibly to calcium supplementation, but more likely from augmentin. Advised to hold both of these medicines.  Prednisone changed to a prolonged 60 mg to 10 mg taper, added pepcid, advised to continue benadryl q6 until sx resolved, then can back down on benadryl. Discussed strict return precautions, plan f/u with pcp for a recheck in one week if sx stable.  Final Clinical Impressions(s) / ED Diagnoses   Final diagnoses:  Allergic reaction, initial encounter    ED Discharge Orders         Ordered    predniSONE (DELTASONE) 10 MG tablet     03/19/19 1319    famotidine (PEPCID) 20 MG tablet  2 times daily     03/19/19 1319    diphenhydrAMINE (BENADRYL) 25 MG tablet  Every 6 hours     03/19/19 1319           Burgess Amordol, Renda Pohlman, PA-C 03/19/19 1702    Vanetta MuldersZackowski, Scott, MD 03/21/19 2123

## 2019-03-21 ENCOUNTER — Encounter: Payer: Self-pay | Admitting: Family Medicine

## 2019-03-21 ENCOUNTER — Other Ambulatory Visit: Payer: Self-pay | Admitting: *Deleted

## 2019-03-21 ENCOUNTER — Telehealth: Payer: Self-pay | Admitting: Family Medicine

## 2019-03-21 MED ORDER — TRIAMCINOLONE ACETONIDE 0.1 % EX CREA: 1.0000 "application " | TOPICAL_CREAM | Freq: Two times a day (BID) | CUTANEOUS | 2 refills | Status: DC

## 2019-03-21 NOTE — Telephone Encounter (Signed)
Pt went to ED on 5/26 for allergic reaction. Pt thinks it may be from coloring her hair with box color. She usually gets professionally done and at one time the color burned her scalp and she had to switch to a different type of color. She colored her hair about one and a half weeks ago and the first allergic reaction was 2 days ago. She did not have problems yesterday but she did not wash her hair and she washed her hair today and right after her neck, palms of hands and feet started itching. Rash as well. States she will send pics through my chart. The pic of ear is from when she went to ED but rest of pics are from today. She is taking prednisone taht she started yesterday. No swelling around her mouth, no sob, no trouble swallowing.

## 2019-03-21 NOTE — Telephone Encounter (Signed)
Patient went to ER on 5/26 for allergic reaction but now she still is itching and wanting a cream called in to [facility]. Please advise

## 2019-03-21 NOTE — Telephone Encounter (Signed)
Discussed with pt and med sent to pharm.  

## 2019-03-21 NOTE — Telephone Encounter (Signed)
Triamcinolone 0.1 per cent 30 g bid to affected area two ref 

## 2019-04-03 ENCOUNTER — Telehealth: Payer: Self-pay | Admitting: *Deleted

## 2019-04-03 DIAGNOSIS — M797 Fibromyalgia: Secondary | ICD-10-CM

## 2019-04-03 NOTE — Telephone Encounter (Signed)
Pt would like a referral to [facility] rheumatology. States she has been told she has fibromyalgia.  Has a lot of pain in shoulder, neck, uppper back. Sometimes swollen. On meds but not helping much.

## 2019-04-03 NOTE — Telephone Encounter (Signed)
Referral placed and pt is aware. 

## 2019-04-03 NOTE — Telephone Encounter (Signed)
Let's do 

## 2019-04-12 ENCOUNTER — Telehealth: Payer: Self-pay | Admitting: Family Medicine

## 2019-04-12 MED ORDER — DOXYCYCLINE HYCLATE 100 MG PO TABS: ORAL_TABLET | ORAL | 0 refills | Status: DC

## 2019-04-12 NOTE — Telephone Encounter (Signed)
Pt would like something called in for a sinus infection. She is having head pressure, top of ears are warm, and a swollen nodule under her chin that is sore to touch. She has been taking allegra.   Please send to [facility] in [place]

## 2019-04-12 NOTE — Telephone Encounter (Signed)
Medication sent in and patient is aware.  

## 2019-04-12 NOTE — Telephone Encounter (Signed)
Let her kn ow normall y we do not, but will since all full doxy 100 bid ten d

## 2019-04-12 NOTE — Telephone Encounter (Signed)
Going on for about a week. No fever, no sore throat, no cough. Pea size lymph node under chin area. Ears feel like they are burning and tight. Please advise. Thank you

## 2019-05-27 ENCOUNTER — Other Ambulatory Visit: Payer: Self-pay

## 2019-05-27 DIAGNOSIS — Z20822 Contact with and (suspected) exposure to covid-19: Secondary | ICD-10-CM

## 2019-05-28 LAB — NOVEL CORONAVIRUS, NAA: SARS-CoV-2, NAA: NOT DETECTED

## 2019-06-06 ENCOUNTER — Other Ambulatory Visit: Payer: Self-pay | Admitting: Orthopedic Surgery

## 2019-06-06 ENCOUNTER — Other Ambulatory Visit (HOSPITAL_COMMUNITY): Payer: Self-pay | Admitting: Orthopedic Surgery

## 2019-06-06 DIAGNOSIS — M542 Cervicalgia: Secondary | ICD-10-CM

## 2019-06-18 ENCOUNTER — Ambulatory Visit (HOSPITAL_COMMUNITY)
Admission: RE | Admit: 2019-06-18 | Discharge: 2019-06-18 | Disposition: A | Discharge status: Home / Self Care | Payer: BC Managed Care – PPO | Source: Ambulatory Visit | Attending: Orthopedic Surgery | Admitting: Orthopedic Surgery

## 2019-06-18 ENCOUNTER — Other Ambulatory Visit: Payer: Self-pay | Admitting: Orthopedic Surgery

## 2019-06-18 ENCOUNTER — Other Ambulatory Visit: Payer: Self-pay

## 2019-06-18 DIAGNOSIS — M542 Cervicalgia: Secondary | ICD-10-CM | POA: Diagnosis not present

## 2019-06-18 DIAGNOSIS — Z809 Family history of malignant neoplasm, unspecified: Secondary | ICD-10-CM

## 2019-06-18 MED ORDER — TECHNETIUM TC 99M MEDRONATE IV KIT
20.0000 | PACK | Freq: Once | INTRAVENOUS | Status: AC | PRN
  Administered 2019-06-18: 12:00:00 20 via INTRAVENOUS

## 2019-06-19 ENCOUNTER — Ambulatory Visit: Payer: BLUE CROSS/BLUE SHIELD | Admitting: Family Medicine

## 2019-06-21 ENCOUNTER — Encounter: Payer: Self-pay | Admitting: Family Medicine

## 2019-06-21 ENCOUNTER — Other Ambulatory Visit: Payer: Self-pay

## 2019-06-21 ENCOUNTER — Ambulatory Visit: Payer: BC Managed Care – PPO | Admitting: Family Medicine

## 2019-06-21 VITALS — BP 130/82 | Temp 97.8°F

## 2019-06-21 DIAGNOSIS — B029 Zoster without complications: Secondary | ICD-10-CM

## 2019-06-21 DIAGNOSIS — R109 Unspecified abdominal pain: Secondary | ICD-10-CM

## 2019-06-21 LAB — POCT URINALYSIS DIPSTICK
Spec Grav, UA: <=1.005 — AB (ref 1.010–1.025)
pH, UA: 6 (ref 5.0–8.0)

## 2019-06-21 MED ORDER — VALACYCLOVIR HCL 1 G PO TABS: 1000.0000 mg | ORAL_TABLET | Freq: Three times a day (TID) | ORAL | 0 refills | Status: DC

## 2019-06-21 MED ORDER — OXYCODONE-ACETAMINOPHEN 5-325 MG PO TABS: 1.0000 | ORAL_TABLET | ORAL | 0 refills | Status: AC | PRN

## 2019-06-21 MED ORDER — ONDANSETRON HCL 8 MG PO TABS: 8.0000 mg | ORAL_TABLET | Freq: Three times a day (TID) | ORAL | 0 refills | Status: DC | PRN

## 2019-06-21 NOTE — Patient Instructions (Signed)
Shingles  Shingles, which is also known as herpes zoster, is an infection that causes a painful skin rash and fluid-filled blisters. It is caused by a virus. Shingles only develops in people who:  Have had chickenpox.  Have been given a medicine to protect against chickenpox (have been vaccinated). Shingles is rare in this group. What are the causes? Shingles is caused by varicella-zoster virus (VZV). This is the same virus that causes chickenpox. After a person is exposed to VZV, the virus stays in the body in an inactive (dormant) state. Shingles develops if the virus is reactivated. This can happen many years after the first (initial) exposure to VZV. It is not known what causes this virus to be reactivated. What increases the risk? People who have had chickenpox or received the chickenpox vaccine are at risk for shingles. Shingles infection is more common in people who:  Are older than age 60.  Have a weakened disease-fighting system (immune system), such as people with: ? HIV. ? AIDS. ? Cancer.  Are taking medicines that weaken the immune system, such as transplant medicines.  Are experiencing a lot of stress. What are the signs or symptoms? Early symptoms of this condition include itching, tingling, and pain in an area on your skin. Pain may be described as burning, stabbing, or throbbing. A few days or weeks after early symptoms start, a painful red rash appears. The rash is usually on one side of the body and has a band-like or belt-like pattern. The rash eventually turns into fluid-filled blisters that break open, change into scabs, and dry up in about 2-3 weeks. At any time during the infection, you may also develop:  A fever.  Chills.  A headache.  An upset stomach. How is this diagnosed? This condition is diagnosed with a skin exam. Skin or fluid samples may be taken from the blisters before a diagnosis is made. These samples are examined under a microscope or sent to  a lab for testing. How is this treated? The rash may last for several weeks. There is not a specific cure for this condition. Your health care provider will probably prescribe medicines to help you manage pain, recover more quickly, and avoid long-term problems. Medicines may include:  Antiviral drugs.  Anti-inflammatory drugs.  Pain medicines.  Anti-itching medicines (antihistamines). If the area involved is on your face, you may be referred to a specialist, such as an eye doctor (ophthalmologist) or an ear, nose, and throat (ENT) doctor (otolaryngologist) to help you avoid eye problems, chronic pain, or disability. Follow these instructions at home: Medicines  Take over-the-counter and prescription medicines only as told by your health care provider.  Apply an anti-itch cream or numbing cream to the affected area as told by your health care provider. Relieving itching and discomfort   Apply cold, wet cloths (cold compresses) to the area of the rash or blisters as told by your health care provider.  Cool baths can be soothing. Try adding baking soda or dry oatmeal to the water to reduce itching. Do not bathe in hot water. Blister and rash care  Keep your rash covered with a loose bandage (dressing). Wear loose-fitting clothing to help ease the pain of material rubbing against the rash.  Keep your rash and blisters clean by washing the area with mild soap and cool water as told by your health care provider.  Check your rash every day for signs of infection. Check for: ? More redness, swelling, or pain. ? Fluid   or blood. ? Warmth. ? Pus or a bad smell.  Do not scratch your rash or pick at your blisters. To help avoid scratching: ? Keep your fingernails clean and cut short. ? Wear gloves or mittens while you sleep, if scratching is a problem. General instructions  Rest as told by your health care provider.  Keep all follow-up visits as told by your health care provider. This  is important.  Wash your hands often with soap and water. If soap and water are not available, use hand sanitizer. Doing this lowers your chance of getting a bacterial skin infection.  Before your blisters change into scabs, your shingles infection can cause chickenpox in people who have never had it or have never been vaccinated against it. To prevent this from happening, avoid contact with other people, especially: ? Babies. ? Pregnant women. ? Children who have eczema. ? Elderly people who have transplants. ? People who have chronic illnesses, such as cancer or AIDS. Contact a health care provider if:  Your pain is not relieved with prescribed medicines.  Your pain does not get better after the rash heals.  You have signs of infection in the rash area, such as: ? More redness, swelling, or pain around the rash. ? Fluid or blood coming from the rash. ? The rash area feeling warm to the touch. ? Pus or a bad smell coming from the rash. Get help right away if:  The rash is on your face or nose.  You have facial pain, pain around your eye area, or loss of feeling on one side of your face.  You have difficulty seeing.  You have ear pain or have ringing in your ear.  You have a loss of taste.  Your condition gets worse. Summary  Shingles, which is also known as herpes zoster, is an infection that causes a painful skin rash and fluid-filled blisters.  This condition is diagnosed with a skin exam. Skin or fluid samples may be taken from the blisters and examined before the diagnosis is made.  Keep your rash covered with a loose bandage (dressing). Wear loose-fitting clothing to help ease the pain of material rubbing against the rash.  Before your blisters change into scabs, your shingles infection can cause chickenpox in people who have never had it or have never been vaccinated against it. This information is not intended to replace advice given to you by your health care  provider. Make sure you discuss any questions you have with your health care provider. Document Released: 10/10/2005 Document Revised: 02/01/2019 Document Reviewed: 06/14/2017 Elsevier Patient Education  2020 [employer]  

## 2019-06-21 NOTE — Progress Notes (Signed)
   Subjective:    Patient ID: Mary [name], female    DOB: 1965-11-20, 53 y.o.   MRN: [id number]  HPI right side pain and nausea for 6 days. Went to urgent care 2 days ago and was prescribed cipro for kidney infection. Received a call this morning and was told she did not have a UTI.  This patient was having significant pain and discomfort.  She went to urgent care and she was diagnosed with a possible UTI She states couple days later they called her and told her the culture was negative She relates right flank pain and discomfort She states now she is starting to see a rash in that area that just started She thought she might of been bit by a bug She states the pain is severe Tramadol is not touching it She states hydrocodone makes her sick She states is very difficult to tolerate the pain Rash that is painful and itchy in the same area as the pain. She noticied the rash this morning.   Review of Systems  Constitutional: Negative for activity change and appetite change.  HENT: Negative for congestion and rhinorrhea.   Respiratory: Negative for cough and shortness of breath.   Cardiovascular: Negative for chest pain and leg swelling.  Gastrointestinal: Negative for abdominal pain, blood in stool, constipation, diarrhea, nausea and vomiting.  Genitourinary: Positive for flank pain.  Musculoskeletal: Positive for back pain.  Skin: Positive for rash. Negative for color change.  Neurological: Negative for dizziness and weakness.  Psychiatric/Behavioral: Negative for agitation and confusion.       Objective:   Physical Exam Vitals signs reviewed.  Constitutional:      General: She is not in acute distress. HENT:     Head: Normocephalic.  Cardiovascular:     Rate and Rhythm: Normal rate and regular rhythm.     Heart sounds: Normal heart sounds. No murmur.  Pulmonary:     Effort: Pulmonary effort is normal.     Breath sounds: Normal breath sounds.  Lymphadenopathy:   Cervical: No cervical adenopathy.  Neurological:     Mental Status: She is alert.  Psychiatric:        Behavior: Behavior normal.    The rash on her back is consistent with early shingles it is in a dermatome pattern and right lower back non-blistered there is a patch scattered approximately 4 inches x 6 inches       Assessment & Plan:  I believe the patient is having shingles Her urine looks fine Abdominal exam is normal She has severe pain in that dermatome pattern on the lower flank around the right side Start Valtrex 3 times daily In addition to this tramadol not helping her She has options of sticking with tramadol or trying oxycodone she would like to try oxycodone half a tablet to a full tablet every 4 hours as needed for severe pain #30 not intended for long-term use  Patient is worried about her diagnosis I feel that she will turn the corner but could have some postherpetic neuropathy patient agreed to do a follow-up visit in 7 to 10 days to see how she is doing she can certainly cancel if she is dramatically better

## 2019-06-25 ENCOUNTER — Other Ambulatory Visit: Payer: BC Managed Care – PPO

## 2019-06-28 ENCOUNTER — Ambulatory Visit
Admission: RE | Admit: 2019-06-28 | Discharge: 2019-06-28 | Disposition: A | Discharge status: Home / Self Care | Payer: BC Managed Care – PPO | Source: Ambulatory Visit | Attending: Orthopedic Surgery | Admitting: Orthopedic Surgery

## 2019-06-28 ENCOUNTER — Other Ambulatory Visit: Payer: Self-pay

## 2019-06-28 ENCOUNTER — Ambulatory Visit (INDEPENDENT_AMBULATORY_CARE_PROVIDER_SITE_OTHER): Payer: BC Managed Care – PPO | Admitting: Family Medicine

## 2019-06-28 ENCOUNTER — Encounter: Payer: Self-pay | Admitting: Family Medicine

## 2019-06-28 VITALS — BP 118/76 | Temp 97.0°F | Ht 63.0 in | Wt 179.0 lb

## 2019-06-28 DIAGNOSIS — B029 Zoster without complications: Secondary | ICD-10-CM | POA: Diagnosis not present

## 2019-06-28 DIAGNOSIS — Z809 Family history of malignant neoplasm, unspecified: Secondary | ICD-10-CM

## 2019-06-28 DIAGNOSIS — K5900 Constipation, unspecified: Secondary | ICD-10-CM | POA: Diagnosis not present

## 2019-06-28 NOTE — Progress Notes (Signed)
   Subjective:    Patient ID: Mary [name], female    DOB: 06-29-1966, 53 y.o.   MRN: [id number]  HPI Patient arrives for a follow up on shingles. Patient states she is doing much better. Patient states she was miserable for several days with burning pain discomfort she did have to use the oxycodone but she has not used it since Wednesday she does states she had some constipation associated with this she states the burning discomfort in the shingles is much better the pain is much better PMH benign  Review of Systems  Constitutional: Negative for activity change and fever.  HENT: Negative for congestion, ear pain and rhinorrhea.   Eyes: Negative for discharge.  Respiratory: Negative for cough, shortness of breath and wheezing.   Cardiovascular: Negative for chest pain.       Objective:    Lungs clear respiratory rate normal heart regular BP good The skin shows significant improvement in regards to the shingles I do not find any evidence of any type of secondary infection     Assessment & Plan:  Constipation we recommended MiraLAX on a daily basis Senokot tablets as needed fiber in the diet water in the diet should gradually get better  Shingles much improvement no longer having to use pain medicine follow-up PRN Shin Grix vaccine recommended within the next 6 months

## 2019-07-12 ENCOUNTER — Other Ambulatory Visit: Payer: Self-pay | Admitting: "Endocrinology

## 2019-07-12 DIAGNOSIS — E038 Other specified hypothyroidism: Secondary | ICD-10-CM

## 2019-07-12 DIAGNOSIS — E063 Autoimmune thyroiditis: Secondary | ICD-10-CM

## 2019-07-22 ENCOUNTER — Encounter: Payer: Self-pay | Admitting: Family Medicine

## 2019-07-22 DIAGNOSIS — R1310 Dysphagia, unspecified: Secondary | ICD-10-CM

## 2019-07-22 NOTE — Telephone Encounter (Signed)
Scan ordered by Dr. Gladstone Lighter and showed small hiatal hernia

## 2019-07-22 NOTE — Telephone Encounter (Signed)
Pt states she would like to see a specialist because she has been having symptoms for a year or two and has been seen multiple times. Having heart palpitations, pain in neck down to mid back, mostly just meat getting stuck and just doesn't eat a lot of meat any more due to that. Pt states when she looks up her symptoms it could all be related to hernia and she declined to make appt with dr Richardson Landry and just wanted a referral.

## 2019-08-12 ENCOUNTER — Other Ambulatory Visit: Payer: Self-pay | Admitting: "Endocrinology

## 2019-08-12 DIAGNOSIS — E038 Other specified hypothyroidism: Secondary | ICD-10-CM

## 2019-08-12 DIAGNOSIS — E063 Autoimmune thyroiditis: Secondary | ICD-10-CM

## 2019-08-14 ENCOUNTER — Encounter: Payer: Self-pay | Admitting: Gastroenterology

## 2019-08-16 ENCOUNTER — Ambulatory Visit: Payer: BLUE CROSS/BLUE SHIELD | Admitting: "Endocrinology

## 2019-08-17 LAB — T4, FREE: Free T4: 1 ng/dL (ref 0.8–1.8)

## 2019-08-17 LAB — HEMOGLOBIN A1C
Hgb A1c MFr Bld: 5.6 % of total Hgb (ref <5.7)
Mean Plasma Glucose: 114 (calc)
eAG (mmol/L): 6.3 (calc)

## 2019-08-17 LAB — LIPID PANEL
Cholesterol: 217 mg/dL — ABNORMAL HIGH (ref <200)
HDL: 44 mg/dL — ABNORMAL LOW (ref >=50)
LDL Cholesterol (Calc): 154 mg/dL (calc) — ABNORMAL HIGH
Non-HDL Cholesterol (Calc): 173 mg/dL (calc) — ABNORMAL HIGH (ref <130)
Total CHOL/HDL Ratio: 4.9 (calc) (ref <5.0)
Triglycerides: 89 mg/dL (ref <150)

## 2019-08-17 LAB — TSH: TSH: 0.94 mIU/L

## 2019-08-22 ENCOUNTER — Other Ambulatory Visit: Payer: Self-pay

## 2019-08-22 ENCOUNTER — Encounter: Payer: Self-pay | Admitting: "Endocrinology

## 2019-08-22 ENCOUNTER — Ambulatory Visit (INDEPENDENT_AMBULATORY_CARE_PROVIDER_SITE_OTHER): Payer: BC Managed Care – PPO | Admitting: "Endocrinology

## 2019-08-22 DIAGNOSIS — E063 Autoimmune thyroiditis: Secondary | ICD-10-CM | POA: Diagnosis not present

## 2019-08-22 DIAGNOSIS — E782 Mixed hyperlipidemia: Secondary | ICD-10-CM

## 2019-08-22 DIAGNOSIS — E038 Other specified hypothyroidism: Secondary | ICD-10-CM

## 2019-08-22 MED ORDER — LEVOTHYROXINE SODIUM 50 MCG PO TABS: ORAL_TABLET | ORAL | 1 refills | Status: DC

## 2019-08-22 MED ORDER — ROSUVASTATIN CALCIUM 5 MG PO TABS: 5.0000 mg | ORAL_TABLET | Freq: Every day | ORAL | 3 refills | Status: DC

## 2019-08-22 NOTE — Progress Notes (Signed)
08/22/2019, 8:29 PM                                Endocrinology Telehealth Visit Follow up Note -During COVID -19 Pandemic  I connected with Mary [name] on 08/22/2019   by telephone and verified that I am speaking with the correct person using two identifiers. Mary [name], 08-16-1966. she has verbally consented to this visit. All issues noted in this document were discussed and addressed. The format was not optimal for physical exam.   Mary [name] is a 53 y.o.-year-old female patient being engaged in telehealth for follow-up of hypothyroidism.  She also has significant hyperlipidemia not on treatment.     Past Medical History:  Diagnosis Date  . Fibromyalgia   . Migraine   . Thyroid disease    Past Surgical History:  Procedure Laterality Date  . TONSILLECTOMY    . TUBAL LIGATION    . uterine ablation     Social History   Socioeconomic History  . Marital status: Married    Spouse name: Not on file  . Number of children: Not on file  . Years of education: Not on file  . Highest education level: Not on file  Occupational History  . Not on file  Social Needs  . Financial resource strain: Not on file  . Food insecurity    Worry: Not on file    Inability: Not on file  . Transportation needs    Medical: Not on file    Non-medical: Not on file  Tobacco Use  . Smoking status: Former Smoker    Types: Cigarettes  . Smokeless tobacco: Never Used  . Tobacco comment: quit feb 2016  Substance and Sexual Activity  . Alcohol use: No    Alcohol/week: 0.0 standard drinks  . Drug use: No  . Sexual activity: Yes    Birth control/protection: Surgical  Lifestyle  . Physical activity    Days per week: Not on file    Minutes per session: Not on file  . Stress: Not on file  Relationships  . Social Herbalist on phone: Not on file     Gets together: Not on file    Attends religious service: Not on file    Active member of club or organization: Not on file    Attends meetings of clubs or organizations: Not on file    Relationship status: Not on file  Other Topics Concern  . Not on file  Social History Narrative  . Not on file   Outpatient Encounter Medications as of 08/22/2019  Medication Sig  . acetaminophen (TYLENOL) 325 MG tablet Take 2 tablets (650 mg total) by mouth every 6 (six) hours as needed for mild pain,  moderate pain or headache.  . diphenhydrAMINE (BENADRYL) 25 MG tablet Take 1 tablet (25 mg total) by mouth every 6 (six) hours.  Marland Kitchen. doxycycline (VIBRA-TABS) 100 MG tablet Take one tablet by mouth twice daily for 10 days  . Escitalopram Oxalate (LEXAPRO PO) Take by mouth.  . famotidine (PEPCID) 20 MG tablet Take 1 tablet (20 mg total) by mouth 2 (two) times daily.  Marland Kitchen. FLUoxetine (PROZAC) 20 MG capsule Take 2 daily  . levothyroxine (EUTHYROX) 50 MCG tablet TAKE 1 TABLET BY MOUTH ONCE DAILY BEFORE BREAKFAST  . ondansetron (ZOFRAN) 8 MG tablet Take 1 tablet (8 mg total) by mouth every 8 (eight) hours as needed for nausea.  . predniSONE (DELTASONE) 10 MG tablet Take 6 tabs daily by mouth for 1 day,  Then 5 tabs daily for 2 days,  4 tabs daily for 2 days,  3 tabs daily for 2 days,  2 tabs daily for 2 days,  Then 1 tab daily for 2 days.  . rosuvastatin (CRESTOR) 5 MG tablet Take 1 tablet (5 mg total) by mouth daily.  Marland Kitchen. tiZANidine (ZANAFLEX) 4 MG tablet Take one half tablet qam, one half tablet in the evening and one whole tablet qhs (Patient not taking: Reported on 06/21/2019)  . triamcinolone cream (KENALOG) 0.1 % Apply 1 application topically 2 (two) times daily. (Patient not taking: Reported on 06/21/2019)  . valACYclovir (VALTREX) 1000 MG tablet Take 1 tablet (1,000 mg total) by mouth 3 (three) times daily.  . [DISCONTINUED] EUTHYROX 50 MCG tablet TAKE 1 TABLET BY MOUTH ONCE DAILY BEFORE BREAKFAST  . [DISCONTINUED]  rosuvastatin (CRESTOR) 5 MG tablet Take 1 tablet (5 mg total) by mouth daily. (Patient not taking: Reported on 06/21/2019)   No facility-administered encounter medications on file as of 08/22/2019.    ALLERGIES: Allergies  Allergen Reactions  . Augmentin [Amoxicillin-Pot Clavulanate]     swelling  . Bactrim [Sulfamethoxazole-Trimethoprim]     Lips swelling   VACCINATION STATUS:  There is no immunization history on file for this patient.   HPI    Mary [name]  is a patient with the above medical history. she was diagnosed  with hypothyroidism at approximate age of 53 years with lab work preceded by suggestive clinical symptoms.  She is currently on levothyroxine 50 mcg p.o. daily before breakfast.  Her recent previsit labs confirm etiology of hypothyroidism to be Hashimoto's thyroiditis.   She continues to feel better.  She has  no new complaints today.   - She has been managed for fibromyalgia.  -She is known to have mild goiter from ultrasound of the thyroid in 2012, her repeat thyroid ultrasound confirms shrinking bilateral thyroid lobes as well as left lobe thyroid nodule shrinking from 2 cm to 1.3 cm.  she reports family history of  thyroid disorders in her mother.  No family history of thyroid cancer.  No history of  radiation therapy to head or neck.    ROS: Limited as above. Physical Exam: There were no vitals taken for this visit. Wt Readings from Last 3 Encounters:  06/28/19 179 lb (81.2 kg)  03/19/19 170 lb (77.1 kg)  12/18/18 179 lb (81.2 kg)     CMP ( most recent) CMP     Component Value Date/Time   NA 142 03/19/2019 1156   NA 140 04/19/2018 0830   K 3.6 03/19/2019 1156   CL 105 03/19/2019 1156   CO2 25 03/19/2019 1156   GLUCOSE 74 03/19/2019 1156   BUN  13 03/19/2019 1156   BUN 17 04/19/2018 0830   CREATININE 0.66 03/19/2019 1156   CALCIUM 9.0 03/19/2019 1156   PROT 6.7 04/19/2018 0830   ALBUMIN 4.5 04/19/2018 0830   AST 16 04/19/2018 0830    ALT 18 04/19/2018 0830   ALKPHOS 79 04/19/2018 0830   BILITOT 0.3 04/19/2018 0830   GFRNONAA >60 03/19/2019 1156   GFRAA >60 03/19/2019 1156     Lipid Panel ( most recent) Lipid Panel     Component Value Date/Time   CHOL 217 (H) 08/16/2019 0742   CHOL 206 (H) 04/19/2018 0830   TRIG 89 08/16/2019 0742   HDL 44 (L) 08/16/2019 0742   HDL 49 04/19/2018 0830   CHOLHDL 4.9 08/16/2019 0742   LDLCALC 154 (H) 08/16/2019 0742       Lab Results  Component Value Date   TSH 0.94 08/16/2019   TSH 2.260 02/12/2019   TSH 1.370 09/26/2018   TSH 1.310 04/19/2018   TSH 1.150 11/29/2016   TSH 1.620 09/25/2015   TSH 0.964 02/18/2013   FREET4 1.0 08/16/2019   FREET4 1.25 02/12/2019   FREET4 1.23 09/26/2018      ASSESSMENT: 1. Hypothyroidism due to Hashimoto's thyroiditis 2.  Hyperlipidemia  PLAN:   -Her previsit TFTs are c/w appropriate replacement.   She is advised to continue levothyroxine 50 mcg p.o. daily before breakfast.     - We discussed about the correct intake of her thyroid hormone, on empty stomach at fasting, with water, separated by at least 30 minutes from breakfast and other medications,  and separated by more than 4 hours from calcium, iron, multivitamins, acid reflux medications (PPIs). -Patient is made aware of the fact that thyroid hormone replacement is needed for life, dose to be adjusted by periodic monitoring of thyroid function tests.   Her thyroid ultrasound confirms shrinking bilateral thyroid lobes as well as left-sided thyroid nodule shrinking from 2 cm to 1.3 cm.  No intervention necessary at this time.  For her severe hyperlipidemia, she was given a rx for Crestor, never picked it up from the pharmacy. Now her LDL is 154, she is re-approached and will try it. I advised her to start 2.5mg  po every other day and will advance as tolerated.  Side effects and precautions discussed with her.   Time for this visit: 15 minutes. Mary Bari   participated in the discussions, expressed understanding, and voiced agreement with the above plans.  All questions were answered to her satisfaction. she is encouraged to contact clinic should she have any questions or concerns prior to her return visit.    Return in about 6 months (around 02/20/2020) for Follow up with Pre-visit Labs.  Marquis Lunch, MD [facility] [address] [place], [place] [address] Phone: [phone]  Fax: [phone]   08/22/2019, 8:29 PM  This note was partially dictated with voice recognition software. Similar sounding words can be transcribed inadequately or may not  be corrected upon review.

## 2019-08-29 ENCOUNTER — Ambulatory Visit: Payer: BC Managed Care – PPO | Admitting: Nurse Practitioner

## 2019-09-08 ENCOUNTER — Emergency Department (HOSPITAL_COMMUNITY)
Admission: EM | Admit: 2019-09-08 | Discharge: 2019-09-09 | Disposition: A | Discharge status: Home / Self Care | Payer: BC Managed Care – PPO | Attending: Emergency Medicine | Admitting: Emergency Medicine

## 2019-09-08 ENCOUNTER — Other Ambulatory Visit: Payer: Self-pay

## 2019-09-08 ENCOUNTER — Emergency Department (HOSPITAL_COMMUNITY): Payer: BC Managed Care – PPO

## 2019-09-08 ENCOUNTER — Encounter (HOSPITAL_COMMUNITY): Payer: Self-pay | Admitting: *Deleted

## 2019-09-08 ENCOUNTER — Encounter: Payer: Self-pay | Admitting: Family Medicine

## 2019-09-08 DIAGNOSIS — R509 Fever, unspecified: Secondary | ICD-10-CM | POA: Diagnosis not present

## 2019-09-08 DIAGNOSIS — R519 Headache, unspecified: Secondary | ICD-10-CM | POA: Diagnosis not present

## 2019-09-08 DIAGNOSIS — E039 Hypothyroidism, unspecified: Secondary | ICD-10-CM | POA: Insufficient documentation

## 2019-09-08 DIAGNOSIS — Z20822 Contact with and (suspected) exposure to covid-19: Secondary | ICD-10-CM

## 2019-09-08 DIAGNOSIS — Z87891 Personal history of nicotine dependence: Secondary | ICD-10-CM | POA: Diagnosis not present

## 2019-09-08 DIAGNOSIS — R05 Cough: Secondary | ICD-10-CM | POA: Diagnosis present

## 2019-09-08 DIAGNOSIS — Z79899 Other long term (current) drug therapy: Secondary | ICD-10-CM | POA: Insufficient documentation

## 2019-09-08 DIAGNOSIS — Z20828 Contact with and (suspected) exposure to other viral communicable diseases: Secondary | ICD-10-CM | POA: Insufficient documentation

## 2019-09-08 MED ORDER — ACETAMINOPHEN 500 MG PO TABS
1000.0000 mg | ORAL_TABLET | Freq: Once | ORAL | Status: AC
  Administered 2019-09-08: 1000 mg via ORAL
  Filled 2019-09-08: qty 2

## 2019-09-08 MED ORDER — IBUPROFEN 800 MG PO TABS
800.0000 mg | ORAL_TABLET | Freq: Once | ORAL | Status: AC
  Administered 2019-09-08: 800 mg via ORAL
  Filled 2019-09-08: qty 1

## 2019-09-08 NOTE — ED Provider Notes (Signed)
[facility] EMERGENCY DEPARTMENT Provider Note   CSN: [id number] Arrival date & time: 09/08/19  1929     History   Chief Complaint Chief Complaint  Patient presents with  . Cough    HPI Mary [name] is a 53 y.o. female with a hx of fibromyalgia, migraine presents to the Emergency Department complaining of gradual, persistent, progressively worsening generalized and throbbing headache, fevers, myalgias and cough onset yesterday.  Patient denies other associated symptoms.  No abdominal pain nausea or vomiting.  Patient reports 1 dose of Tylenol earlier in the day prior to arrival.  No other treatments.  No aggravating or alleviating factors.  No known sick or Covid contacts.  Patient has not been tested for this since the onset of her symptoms.     The history is provided by the patient and medical records. No language interpreter was used.    Past Medical History:  Diagnosis Date  . Fibromyalgia   . Migraine   . Thyroid disease     Patient Active Problem List   Diagnosis Date Noted  . Mixed hyperlipidemia 02/14/2019  . Hypothyroidism 09/26/2018  . Goiter 09/26/2018  . Palpitations 09/25/2015  . Adjustment disorder with mixed anxiety and depressed mood 08/03/2014  . Fibromyalgia affecting multiple sites 05/28/2014    Past Surgical History:  Procedure Laterality Date  . TONSILLECTOMY    . TUBAL LIGATION    . uterine ablation       OB History   No obstetric history on file.      Home Medications    Prior to Admission medications   Medication Sig Start Date End Date Taking? Authorizing Provider  ALPRAZolam (XANAX) 0.25 MG tablet Take 0.25 mg by mouth 3 (three) times daily as needed. 08/28/19  Yes [provider]  DULoxetine (CYMBALTA) 60 MG capsule Take 60 mg by mouth 2 (two) times daily. 09/07/19  Yes [provider]  Escitalopram Oxalate (LEXAPRO PO) Take by mouth.   Yes [provider]  levothyroxine (EUTHYROX) 50 MCG tablet  TAKE 1 TABLET BY MOUTH ONCE DAILY BEFORE BREAKFAST 08/22/19  Yes Nida, Marella Chimes, MD  meloxicam (MOBIC) 15 MG tablet Take 1 tablet by mouth daily. 07/20/19  Yes [provider]  rosuvastatin (CRESTOR) 5 MG tablet Take 1 tablet (5 mg total) by mouth daily. 08/22/19  Yes Nida, Marella Chimes, MD  traMADol (ULTRAM) 50 MG tablet Take 50-100 mg by mouth every 6 (six) hours as needed. 09/07/19  Yes [provider]  Vitamin D, Ergocalciferol, (DRISDOL) 1.25 MG (50000 UT) CAPS capsule Take 50,000 Units by mouth once a week. 07/12/19  Yes [provider]  acetaminophen (TYLENOL) 325 MG tablet Take 2 tablets (650 mg total) by mouth every 6 (six) hours as needed for mild pain, moderate pain or headache. 08/30/16   Waynetta Pean, PA-C  diphenhydrAMINE (BENADRYL) 25 MG tablet Take 1 tablet (25 mg total) by mouth every 6 (six) hours. 03/19/19   Evalee Jefferson, PA-C  doxycycline (VIBRA-TABS) 100 MG tablet Take one tablet by mouth twice daily for 10 days 04/12/19   Mikey Kirschner, MD  famotidine (PEPCID) 20 MG tablet Take 1 tablet (20 mg total) by mouth 2 (two) times daily. 03/19/19   Evalee Jefferson, PA-C  FLUoxetine (PROZAC) 20 MG capsule Take 2 daily 02/20/19   Mikey Kirschner, MD  ondansetron (ZOFRAN) 8 MG tablet Take 1 tablet (8 mg total) by mouth every 8 (eight) hours as needed for nausea. Patient not taking: Reported  on 09/08/2019 06/21/19   Babs Sciara, MD  predniSONE (DELTASONE) 10 MG tablet Take 6 tabs daily by mouth for 1 day,  Then 5 tabs daily for 2 days,  4 tabs daily for 2 days,  3 tabs daily for 2 days,  2 tabs daily for 2 days,  Then 1 tab daily for 2 days. 03/19/19   Burgess Amor, PA-C  tiZANidine (ZANAFLEX) 4 MG tablet Take one half tablet qam, one half tablet in the evening and one whole tablet qhs Patient not taking: Reported on 06/21/2019 02/20/19   Merlyn Albert, MD  triamcinolone cream (KENALOG) 0.1 % Apply 1 application topically 2 (two) times daily. Patient not  taking: Reported on 06/21/2019 03/21/19   Merlyn Albert, MD  valACYclovir (VALTREX) 1000 MG tablet Take 1 tablet (1,000 mg total) by mouth 3 (three) times daily. 06/21/19   Babs Sciara, MD    Family History Family History  Problem Relation Age of Onset  . Heart disease Mother   . Heart disease Father     Social History Social History   Tobacco Use  . Smoking status: Former Smoker    Types: Cigarettes  . Smokeless tobacco: Never Used  . Tobacco comment: quit feb 2016  Substance Use Topics  . Alcohol use: No    Alcohol/week: 0.0 standard drinks  . Drug use: No     Allergies   Augmentin [amoxicillin-pot clavulanate] and Bactrim [sulfamethoxazole-trimethoprim]   Review of Systems Review of Systems  Constitutional: Positive for fatigue and fever. Negative for appetite change, diaphoresis and unexpected weight change.  HENT: Negative for mouth sores.   Eyes: Negative for visual disturbance.  Respiratory: Positive for cough. Negative for chest tightness, shortness of breath and wheezing.   Cardiovascular: Negative for chest pain.  Gastrointestinal: Negative for abdominal pain, constipation, diarrhea, nausea and vomiting.  Endocrine: Negative for polydipsia, polyphagia and polyuria.  Genitourinary: Negative for dysuria, frequency, hematuria and urgency.  Musculoskeletal: Positive for back pain (low) and myalgias. Negative for neck stiffness.  Skin: Negative for rash.  Allergic/Immunologic: Negative for immunocompromised state.  Neurological: Positive for headaches. Negative for syncope and light-headedness.  Hematological: Does not bruise/bleed easily.  Psychiatric/Behavioral: Negative for sleep disturbance. The patient is not nervous/anxious.      Physical Exam Updated Vital Signs BP 130/86 (BP Location: Right Arm)   Pulse (!) 102   Temp (!) 102 F (38.9 C) (Oral)   Resp 14   Ht 5\' 3"  (1.6 m)   Wt 77.1 kg   SpO2 100%   BMI 30.11 kg/m   Physical Exam Vitals  signs and nursing note reviewed.  Constitutional:      General: She is not in acute distress.    Appearance: She is not diaphoretic.  HENT:     Head: Normocephalic.  Eyes:     General: No scleral icterus.    Conjunctiva/sclera: Conjunctivae normal.  Neck:     Musculoskeletal: Normal range of motion.  Cardiovascular:     Rate and Rhythm: Regular rhythm. Tachycardia present.     Pulses: Normal pulses.          Radial pulses are 2+ on the right side and 2+ on the left side.  Pulmonary:     Effort: No tachypnea, accessory muscle usage, prolonged expiration, respiratory distress or retractions.     Breath sounds: No stridor.     Comments: Equal chest rise. No increased work of breathing. Clear and equal breath sounds Abdominal:  General: There is no distension.     Palpations: Abdomen is soft.     Tenderness: There is no abdominal tenderness. There is no guarding or rebound.  Musculoskeletal:     Comments: Moves all extremities equally and without difficulty.  Skin:    General: Skin is warm and dry.     Capillary Refill: Capillary refill takes less than 2 seconds.  Neurological:     Mental Status: She is alert.     GCS: GCS eye subscore is 4. GCS verbal subscore is 5. GCS motor subscore is 6.     Comments: Speech is clear and goal oriented.  Psychiatric:        Mood and Affect: Mood normal.      ED Treatments / Results   Radiology Dg Chest Port 1 View  Result Date: 09/08/2019 CLINICAL DATA:  Cough and fever EXAM: PORTABLE CHEST 1 VIEW COMPARISON:  08/30/2016 FINDINGS: The heart size and mediastinal contours are within normal limits. Both lungs are clear. The visualized skeletal structures are unremarkable. IMPRESSION: No active disease. Electronically Signed   By: Jasmine PangKim  Fujinaga M.D.   On: 09/08/2019 21:42    Procedures Procedures (including critical care time)  Medications Ordered in ED Medications  acetaminophen (TYLENOL) tablet 1,000 mg (1,000 mg Oral Given  09/08/19 2010)  acetaminophen (TYLENOL) tablet 1,000 mg (1,000 mg Oral Given 09/08/19 2203)  ibuprofen (ADVIL) tablet 800 mg (800 mg Oral Given 09/08/19 2326)     Initial Impression / Assessment and Plan / ED Course  I have reviewed the triage vital signs and the nursing notes.  Pertinent labs & imaging results that were available during my care of the patient were reviewed by me and considered in my medical decision making (see chart for details).  Clinical Course as of Sep 07 2345  Wynelle LinkSun Sep 08, 2019  2149 No groundglass opacities or consolidations to suggest bacterial pneumonia.  Personally evaluated these images.  DG Chest Port 1 View [HM]    Clinical Course User Index [HM] Zandra Lajeunesse, Dahlia ClientHannah, PA-C        Tamala BariKimberly D Bangerter was evaluated in Emergency Department on 09/08/2019 for the symptoms described in the history of present illness. She was evaluated in the context of the global COVID-19 pandemic, which necessitated consideration that the patient might be at risk for infection with the SARS-CoV-2 virus that causes COVID-19. Institutional protocols and algorithms that pertain to the evaluation of patients at risk for COVID-19 are in a state of rapid change based on information released by regulatory bodies including the CDC and federal and state organizations. These policies and algorithms were followed during the patient's care in the ED.   Patient presents with Covid symptoms.  She is febrile.  Mild tachycardia on arrival.  Antipyretics given.  Clear and equal breath sounds, no hypoxia and chest x-ray without evidence of focal consolidation.  Patient is otherwise well-appearing.  No neck stiffness or nuchal rigidity to suggest meningitis.  Headache improved after medication.  Patient will need outpatient testing for Covid.  Home therapies discussed.  Patient states understanding and is in agreement with the plan.  BP 109/62   Pulse 82   Temp 98.9 F (37.2 C) (Oral)   Resp 18    Ht 5\' 3"  (1.6 m)   Wt 77.1 kg   SpO2 94%   BMI 30.11 kg/m     Final Clinical Impressions(s) / ED Diagnoses   Final diagnoses:  Suspected COVID-19 virus infection  Fever, unspecified fever  cause    ED Discharge Orders    None       Wade Sigala, Boyd Kerbs 09/08/19 2347    Bethann Berkshire, MD 09/09/19 [phone]

## 2019-09-08 NOTE — ED Triage Notes (Signed)
Pt with cough and HA since yesterday, low grade fever earlier today per pt.

## 2019-09-08 NOTE — Discharge Instructions (Addendum)
1. Medications: Tylenol or ibuprofen for fever and headache, usual home medications 2. Treatment: rest, drink plenty of fluids, nasal saline washes for congestion 3. Follow Up: Please followup with your primary doctor as needed if your symptoms are worsening; Please return to the ER for high fevers, persistent vomiting, severe abdominal pain or other concerning symptoms.  This website will provide information about testing sites and procedures: [email or web address]      Person Under Monitoring Name: Mary [name]  Location: [address] [place] [address]   Infection Prevention Recommendations for Individuals Confirmed to have, or Being Evaluated for, 2019 Novel Coronavirus (COVID-19) Infection Who Receive Care at Home  Individuals who are confirmed to have, or are being evaluated for, COVID-19 should follow the prevention steps below until a healthcare provider or local or state health department says they can return to normal activities.  Stay home except to get medical care You should restrict activities outside your home, except for getting medical care. Do not go to work, school, or public areas, and do not use public transportation or taxis.  Call ahead before visiting your doctor Before your medical appointment, call the healthcare provider and tell them that you have, or are being evaluated for, COVID-19 infection. This will help the healthcare providers office take steps to keep other people from getting infected. Ask your healthcare provider to call the local or state health department.  Monitor your symptoms Seek prompt medical attention if your illness is worsening (e.g., difficulty breathing). Before going to your medical appointment, call the healthcare provider and tell them that you have, or are being evaluated for, COVID-19 infection. Ask your healthcare provider to call the local or state health department.  Wear a  facemask You should wear a facemask that covers your nose and mouth when you are in the same room with other people and when you visit a healthcare provider. People who live with or visit you should also wear a facemask while they are in the same room with you.  Separate yourself from other people in your home As much as possible, you should stay in a different room from other people in your home. Also, you should use a separate bathroom, if available.  Avoid sharing household items You should not share dishes, drinking glasses, cups, eating utensils, towels, bedding, or other items with other people in your home. After using these items, you should wash them thoroughly with soap and water.  Cover your coughs and sneezes Cover your mouth and nose with a tissue when you cough or sneeze, or you can cough or sneeze into your sleeve. Throw used tissues in a lined trash can, and immediately wash your [name] with soap and water for at least 20 seconds or use an alcohol-based hand rub.  Wash your [employer] your [name] often and thoroughly with soap and water for at least 20 seconds. You can use an alcohol-based hand sanitizer if soap and water are not available and if your [name] are not visibly dirty. Avoid touching your eyes, nose, and mouth with unwashed [name].   Prevention Steps for Caregivers and Household Members of Individuals Confirmed to have, or Being Evaluated for, COVID-19 Infection Being Cared for in the Home  If you live with, or provide care at home for, a person confirmed to have, or being evaluated for, COVID-19 infection please follow these guidelines to prevent infection:  Follow healthcare providers instructions Make sure that you understand and can help the patient follow any  healthcare provider instructions for all care.  Provide for the patients basic needs You should help the patient with basic needs in the home and provide support for getting groceries,  prescriptions, and other personal needs.  Monitor the patients symptoms If they are getting sicker, call his or her medical provider and tell them that the patient has, or is being evaluated for, COVID-19 infection. This will help the healthcare providers office take steps to keep other people from getting infected. Ask the healthcare provider to call the local or state health department.  Limit the number of people who have contact with the patient If possible, have only one caregiver for the patient. Other household members should stay in another home or place of residence. If this is not possible, they should stay in another room, or be separated from the patient as much as possible. Use a separate bathroom, if available. Restrict visitors who do not have an essential need to be in the home.  Keep older adults, very young children, and other sick people away from the patient Keep older adults, very young children, and those who have compromised immune systems or chronic health conditions away from the patient. This includes people with chronic heart, lung, or kidney conditions, diabetes, and cancer.  Ensure good ventilation Make sure that shared spaces in the home have good air flow, such as from an air conditioner or an opened window, weather permitting.  Wash your [name] often Wash your [name] often and thoroughly with soap and water for at least 20 seconds. You can use an alcohol based hand sanitizer if soap and water are not available and if your [name] are not visibly dirty. Avoid touching your eyes, nose, and mouth with unwashed [name]. Use disposable paper towels to dry your [name]. If not available, use dedicated cloth towels and replace them when they become wet.  Wear a facemask and gloves Wear a disposable facemask at all times in the room and gloves when you touch or have contact with the patients blood, body fluids, and/or secretions or excretions, such as sweat, saliva,  sputum, nasal mucus, vomit, urine, or feces.  Ensure the mask fits over your nose and mouth tightly, and do not touch it during use. Throw out disposable facemasks and gloves after using them. Do not reuse. Wash your [name] immediately after removing your facemask and gloves. If your personal clothing becomes contaminated, carefully remove clothing and launder. Wash your [name] after handling contaminated clothing. Place all used disposable facemasks, gloves, and other waste in a lined container before disposing them with other household waste. Remove gloves and wash your [name] immediately after handling these items.  Do not share dishes, glasses, or other household items with the patient Avoid sharing household items. You should not share dishes, drinking glasses, cups, eating utensils, towels, bedding, or other items with a patient who is confirmed to have, or being evaluated for, COVID-19 infection. After the person uses these items, you should wash them thoroughly with soap and water.  Wash laundry thoroughly Immediately remove and wash clothes or bedding that have blood, body fluids, and/or secretions or excretions, such as sweat, saliva, sputum, nasal mucus, vomit, urine, or feces, on them. Wear gloves when handling laundry from the patient. Read and follow directions on labels of laundry or clothing items and detergent. In general, wash and dry with the warmest temperatures recommended on the label.  Clean all areas the individual has used often Clean all touchable surfaces, such as counters, tabletops,  doorknobs, bathroom fixtures, toilets, phones, keyboards, tablets, and bedside tables, every day. Also, clean any surfaces that may have blood, body fluids, and/or secretions or excretions on them. Wear gloves when cleaning surfaces the patient has come in contact with. Use a diluted bleach solution (e.g., dilute bleach with 1 part bleach and 10 parts water) or a household disinfectant with a  label that says EPA-registered for coronaviruses. To make a bleach solution at home, add 1 tablespoon of bleach to 1 quart (4 cups) of water. For a larger supply, add  cup of bleach to 1 gallon (16 cups) of water. Read labels of cleaning products and follow recommendations provided on product labels. Labels contain instructions for safe and effective use of the cleaning product including precautions you should take when applying the product, such as wearing gloves or eye protection and making sure you have good ventilation during use of the product. Remove gloves and wash [name] immediately after cleaning.  Monitor yourself for signs and symptoms of illness Caregivers and household members are considered close contacts, should monitor their health, and will be asked to limit movement outside of the home to the extent possible. Follow the monitoring steps for close contacts listed on the symptom monitoring form.   ? If you have additional questions, contact your local health department or call the epidemiologist on call at [phone] (available 24/7). ? This guidance is subject to change. For the most up-to-date guidance from [facility], please refer to their website: [email or web address].hu        Person Under Monitoring Name: Mary [name]  Location: [address] [place] [address]   Infection Prevention Recommendations for Individuals Confirmed to have, or Being Evaluated for, 2019 Novel Coronavirus (COVID-19) Infection Who Receive Care at Home  Individuals who are confirmed to have, or are being evaluated for, COVID-19 should follow the prevention steps below until a healthcare provider or local or state health department says they can return to normal activities.  Stay home except to get medical care You should restrict activities outside your home, except for getting medical care. Do not go to work, school, or public areas,  and do not use public transportation or taxis.  Call ahead before visiting your doctor Before your medical appointment, call the healthcare provider and tell them that you have, or are being evaluated for, COVID-19 infection. This will help the healthcare providers office take steps to keep other people from getting infected. Ask your healthcare provider to call the local or state health department.  Monitor your symptoms Seek prompt medical attention if your illness is worsening (e.g., difficulty breathing). Before going to your medical appointment, call the healthcare provider and tell them that you have, or are being evaluated for, COVID-19 infection. Ask your healthcare provider to call the local or state health department.  Wear a facemask You should wear a facemask that covers your nose and mouth when you are in the same room with other people and when you visit a healthcare provider. People who live with or visit you should also wear a facemask while they are in the same room with you.  Separate yourself from other people in your home As much as possible, you should stay in a different room from other people in your home. Also, you should use a separate bathroom, if available.  Avoid sharing household items You should not share dishes, drinking glasses, cups, eating utensils, towels, bedding, or other items with other people in your home. After using these  items, you should wash them thoroughly with soap and water.  Cover your coughs and sneezes Cover your mouth and nose with a tissue when you cough or sneeze, or you can cough or sneeze into your sleeve. Throw used tissues in a lined trash can, and immediately wash your [name] with soap and water for at least 20 seconds or use an alcohol-based hand rub.  Wash your [employer] your [name] often and thoroughly with soap and water for at least 20 seconds. You can use an alcohol-based hand sanitizer if soap and water are not  available and if your [name] are not visibly dirty. Avoid touching your eyes, nose, and mouth with unwashed [name].   Prevention Steps for Caregivers and Household Members of Individuals Confirmed to have, or Being Evaluated for, COVID-19 Infection Being Cared for in the Home  If you live with, or provide care at home for, a person confirmed to have, or being evaluated for, COVID-19 infection please follow these guidelines to prevent infection:  Follow healthcare providers instructions Make sure that you understand and can help the patient follow any healthcare provider instructions for all care.  Provide for the patients basic needs You should help the patient with basic needs in the home and provide support for getting groceries, prescriptions, and other personal needs.  Monitor the patients symptoms If they are getting sicker, call his or her medical provider and tell them that the patient has, or is being evaluated for, COVID-19 infection. This will help the healthcare providers office take steps to keep other people from getting infected. Ask the healthcare provider to call the local or state health department.  Limit the number of people who have contact with the patient If possible, have only one caregiver for the patient. Other household members should stay in another home or place of residence. If this is not possible, they should stay in another room, or be separated from the patient as much as possible. Use a separate bathroom, if available. Restrict visitors who do not have an essential need to be in the home.  Keep older adults, very young children, and other sick people away from the patient Keep older adults, very young children, and those who have compromised immune systems or chronic health conditions away from the patient. This includes people with chronic heart, lung, or kidney conditions, diabetes, and cancer.  Ensure good ventilation Make sure that shared spaces  in the home have good air flow, such as from an air conditioner or an opened window, weather permitting.  Wash your [name] often Wash your [name] often and thoroughly with soap and water for at least 20 seconds. You can use an alcohol based hand sanitizer if soap and water are not available and if your [name] are not visibly dirty. Avoid touching your eyes, nose, and mouth with unwashed [name]. Use disposable paper towels to dry your [name]. If not available, use dedicated cloth towels and replace them when they become wet.  Wear a facemask and gloves Wear a disposable facemask at all times in the room and gloves when you touch or have contact with the patients blood, body fluids, and/or secretions or excretions, such as sweat, saliva, sputum, nasal mucus, vomit, urine, or feces.  Ensure the mask fits over your nose and mouth tightly, and do not touch it during use. Throw out disposable facemasks and gloves after using them. Do not reuse. Wash your [name] immediately after removing your facemask and gloves. If your personal clothing becomes  contaminated, carefully remove clothing and launder. Wash your [name] after handling contaminated clothing. Place all used disposable facemasks, gloves, and other waste in a lined container before disposing them with other household waste. Remove gloves and wash your [name] immediately after handling these items.  Do not share dishes, glasses, or other household items with the patient Avoid sharing household items. You should not share dishes, drinking glasses, cups, eating utensils, towels, bedding, or other items with a patient who is confirmed to have, or being evaluated for, COVID-19 infection. After the person uses these items, you should wash them thoroughly with soap and water.  Wash laundry thoroughly Immediately remove and wash clothes or bedding that have blood, body fluids, and/or secretions or excretions, such as sweat, saliva, sputum, nasal mucus,  vomit, urine, or feces, on them. Wear gloves when handling laundry from the patient. Read and follow directions on labels of laundry or clothing items and detergent. In general, wash and dry with the warmest temperatures recommended on the label.  Clean all areas the individual has used often Clean all touchable surfaces, such as counters, tabletops, doorknobs, bathroom fixtures, toilets, phones, keyboards, tablets, and bedside tables, every day. Also, clean any surfaces that may have blood, body fluids, and/or secretions or excretions on them. Wear gloves when cleaning surfaces the patient has come in contact with. Use a diluted bleach solution (e.g., dilute bleach with 1 part bleach and 10 parts water) or a household disinfectant with a label that says EPA-registered for coronaviruses. To make a bleach solution at home, add 1 tablespoon of bleach to 1 quart (4 cups) of water. For a larger supply, add  cup of bleach to 1 gallon (16 cups) of water. Read labels of cleaning products and follow recommendations provided on product labels. Labels contain instructions for safe and effective use of the cleaning product including precautions you should take when applying the product, such as wearing gloves or eye protection and making sure you have good ventilation during use of the product. Remove gloves and wash [name] immediately after cleaning.  Monitor yourself for signs and symptoms of illness Caregivers and household members are considered close contacts, should monitor their health, and will be asked to limit movement outside of the home to the extent possible. Follow the monitoring steps for close contacts listed on the symptom monitoring form.   ? If you have additional questions, contact your local health department or call the epidemiologist on call at [phone] (available 24/7). ? This guidance is subject to change. For the most up-to-date guidance from [facility], please refer to their  website: [email or web address].hu

## 2019-09-09 ENCOUNTER — Ambulatory Visit (INDEPENDENT_AMBULATORY_CARE_PROVIDER_SITE_OTHER): Payer: BC Managed Care – PPO | Admitting: Family Medicine

## 2019-09-09 ENCOUNTER — Encounter: Payer: Self-pay | Admitting: Family Medicine

## 2019-09-09 ENCOUNTER — Other Ambulatory Visit: Payer: Self-pay | Admitting: *Deleted

## 2019-09-09 ENCOUNTER — Telehealth: Payer: Self-pay | Admitting: Family Medicine

## 2019-09-09 DIAGNOSIS — J4521 Mild intermittent asthma with (acute) exacerbation: Secondary | ICD-10-CM

## 2019-09-09 DIAGNOSIS — Z20822 Contact with and (suspected) exposure to covid-19: Secondary | ICD-10-CM

## 2019-09-09 DIAGNOSIS — Z20828 Contact with and (suspected) exposure to other viral communicable diseases: Secondary | ICD-10-CM | POA: Diagnosis not present

## 2019-09-09 MED ORDER — HYDROCODONE-HOMATROPINE 5-1.5 MG/5ML PO SYRP: ORAL_SOLUTION | ORAL | 0 refills | Status: DC

## 2019-09-09 MED ORDER — CEFPROZIL 500 MG PO TABS: ORAL_TABLET | ORAL | 0 refills | Status: DC

## 2019-09-09 MED ORDER — ALBUTEROL SULFATE HFA 108 (90 BASE) MCG/ACT IN AERS: INHALATION_SPRAY | RESPIRATORY_TRACT | 0 refills | Status: DC

## 2019-09-09 NOTE — Telephone Encounter (Signed)
Patient notified

## 2019-09-09 NOTE — Telephone Encounter (Signed)
Already done I just havent had two seconds to sign, signed

## 2019-09-09 NOTE — Telephone Encounter (Signed)
Patient had virtual visit today and would like some cough syrup with codeine called into [facility] [place]

## 2019-09-09 NOTE — Progress Notes (Signed)
   Subjective:  Audio plus video  Patient ID: Mary [name], female    DOB: 1965-11-09, 53 y.o.   MRN: [id number]  Sore Throat  This is a new problem. Episode onset: Saturday  Associated symptoms include congestion and coughing. Associated symptoms comments: Head, eyes and back hurt, fever, cough, went to [facility] to have COVID test about an hour ago. . She has tried acetaminophen (Mucinex) for the symptoms. The treatment provided mild relief.   Virtual Visit via Video Note  I connected with Mary [name] on 09/09/19 at  1:10 PM EST by a video enabled telemedicine application and verified that I am speaking with the correct person using two identifiers.  Location: Patient: home Provider: office   I discussed the limitations of evaluation and management by telemedicine and the availability of in person appointments. The patient expressed understanding and agreed to proceed.  History of Present Illness:    Observations/Objective:   Assessment and Plan:   Follow Up Instructions:    I discussed the assessment and treatment plan with the patient. The patient was provided an opportunity to ask questions and all were answered. The patient agreed with the plan and demonstrated an understanding of the instructions.   The patient was advised to call back or seek an in-person evaluation if the symptoms worsen or if the condition fails to improve as anticipated.  I provided 18 minutes of non-face-to-face time during this encounter. Fairly severe headache.  Cough bad at times.  Wheezy texture breathing.  Cough is productive of phlegm intermittently  [facility], LPN    Review of Systems  HENT: Positive for congestion.   Respiratory: Positive for cough.        Objective:   Physical Exam  Virtual      Assessment & Plan:  Impression probable COVID-19 with element of reactive airways.  Fairly severe headache.  Will cover  with Hycodan cough medicine.  Plus inhaler.  Also will cover with antibiotic.  Await testing results.  Symptom care discussed.

## 2019-09-10 ENCOUNTER — Telehealth: Payer: Self-pay | Admitting: Family Medicine

## 2019-09-10 NOTE — Telephone Encounter (Signed)
Ok thru Sunday for now if returs pos will add addtnl week

## 2019-09-10 NOTE — Telephone Encounter (Signed)
Patient had a virtual visit yesterday.  She is waiting on Covid results but needs a work note from Monday the 16th until whenever Dr Richardson Landry thinks she should go back to work. Patient needs it sent to her mychart.

## 2019-09-11 ENCOUNTER — Telehealth: Payer: Self-pay | Admitting: Family Medicine

## 2019-09-11 ENCOUNTER — Encounter: Payer: Self-pay | Admitting: Family Medicine

## 2019-09-11 LAB — NOVEL CORONAVIRUS, NAA: SARS-CoV-2, NAA: DETECTED — AB

## 2019-09-11 NOTE — Telephone Encounter (Signed)
Pos as expected, this pt will likely freak out, we have explained all precautions to her, plz take some time and repeat these and ;et her [facility] dept will be contacting

## 2019-09-11 NOTE — Telephone Encounter (Signed)
Results discussed with patient. Patient needs a work note for the time she needs to stay out for the positive results.

## 2019-09-11 NOTE — Telephone Encounter (Signed)
Pt is calling checking to see if COVID results are back.

## 2019-09-11 NOTE — Telephone Encounter (Signed)
Ok thru next sunday29

## 2019-09-13 ENCOUNTER — Telehealth: Payer: Self-pay | Admitting: Family Medicine

## 2019-09-13 MED ORDER — ETODOLAC 400 MG PO TABS: 400.0000 mg | ORAL_TABLET | Freq: Two times a day (BID) | ORAL | 0 refills | Status: DC | PRN

## 2019-09-13 NOTE — Telephone Encounter (Signed)
Left message to return call (prescription sent electronically to pharmacy)

## 2019-09-13 NOTE — Telephone Encounter (Signed)
Pt has prednisone at home and would like to know if she can take it. She states her lower back is hurting so bad she cant rest and feels the virus has attacked her nerves. If she cant take the prednisone she has at home is there anything else she can take?   [place], [place] - [address]

## 2019-09-13 NOTE — Telephone Encounter (Signed)
Patient notified and verbalized understanding. 

## 2019-09-13 NOTE — Telephone Encounter (Signed)
Highly doubtful it is kidneys, it is highly likely low back pain from the virus.it causes back pain just like the flu does Prednisone is a BA idea, coud make the virus infection worse when administered to someone not in hospital, lodine 400 numb 28 one bid with food prn pain

## 2019-09-13 NOTE — Telephone Encounter (Signed)
Patient called back and said her lower back pain is constant and doesn't help no matter what position so she not sure if something to do with her kidneys now?

## 2019-11-26 ENCOUNTER — Ambulatory Visit (INDEPENDENT_AMBULATORY_CARE_PROVIDER_SITE_OTHER): Payer: BC Managed Care – PPO | Admitting: Family Medicine

## 2019-11-26 ENCOUNTER — Encounter: Payer: Self-pay | Admitting: Family Medicine

## 2019-11-26 ENCOUNTER — Other Ambulatory Visit: Payer: Self-pay

## 2019-11-26 DIAGNOSIS — F411 Generalized anxiety disorder: Secondary | ICD-10-CM | POA: Diagnosis not present

## 2019-11-26 DIAGNOSIS — F321 Major depressive disorder, single episode, moderate: Secondary | ICD-10-CM

## 2019-11-26 NOTE — Progress Notes (Signed)
   Subjective:  Audio only  Patient ID: Mary [name], female    DOB: 03/26/1966, 54 y.o.   MRN: [id number]  HPI  Patient calls to discuss stress that is affecting work. Patient states her daughter is in drug rehab and she has been unable to focus at work and she got wrote up at work and they are not being understanding and she would like to take some time from work till she is in a better place.  Virtual Visit via Video Note  I connected with Mary [name] on 11/26/19 at  2:30 PM EST by a video enabled telemedicine application and verified that I am speaking with the correct person using two identifiers.  Location: Patient: home Provider: office   I discussed the limitations of evaluation and management by telemedicine and the availability of in person appointments. The patient expressed understanding and agreed to proceed.  History of Present Illness:    Observations/Objective:   Assessment and Plan:   Follow Up Instructions:    I discussed the assessment and treatment plan with the patient. The patient was provided an opportunity to ask questions and all were answered. The patient agreed with the plan and demonstrated an understanding of the instructions.   The patient was advised to call back or seek an in-person evaluation if the symptoms worsen or if the condition fails to improve as anticipated.  I provided 30 minutes of non-face-to-face time during this encounter.   difficulty with focusing and attentiom  Patient is on fairly high-dose Cymbalta 60 mg twice daily.  Her pain specialist.  She was hoping this will help her pain.  Really has not much.  She was also hoping it would help her elements of depression and anxiety.  Patient reports that mom has not helped.  Also notes that her head feels funny  Also notes feeling overwhelmed with challenges between difficult workplace setting and tremendous challenges at home.  Patient's daughter abuses drugs.  She  is trying to help her with inpatient rehab support.  Rest of family is disagreement with [name] as it should she will do this or not.  No suicidal homicidal thoughts  Notes worsening anxiety.  Notes worsening depression.  Spent on 3 different antidepressants Prozac Lexapro and now Cymbalta with minimal assistance with her depression and anxiety     Review of Systems Positive headache.  Positive chronic fibromyalgia symptoms    Objective:   Physical Exam  Virtual      Assessment & Plan:  Impression patient with significant mental and physical challenges now worsening in terms of her mental status.  Reports substantial anxiety.  Sports substantial feelings of depression also.  No suicidal thoughts.  No homicidal thoughts.  Patient very frustrated.  Asking for some time off work.  We will give her a 30-day work excuse.  In addition I strongly recommend that she connect with a psychiatrist.  Rationale discussed.  Referral initiated

## 2019-11-27 ENCOUNTER — Encounter: Payer: Self-pay | Admitting: Family Medicine

## 2019-11-27 ENCOUNTER — Telehealth (HOSPITAL_COMMUNITY): Payer: Self-pay | Admitting: Professional

## 2019-11-28 ENCOUNTER — Telehealth: Payer: Self-pay | Admitting: Family Medicine

## 2019-11-28 ENCOUNTER — Other Ambulatory Visit (HOSPITAL_COMMUNITY): Payer: BC Managed Care – PPO

## 2019-11-28 ENCOUNTER — Ambulatory Visit (HOSPITAL_COMMUNITY): Payer: BC Managed Care – PPO | Admitting: Licensed Clinical Social Worker

## 2019-11-28 ENCOUNTER — Other Ambulatory Visit: Payer: Self-pay

## 2019-11-28 DIAGNOSIS — Z029 Encounter for administrative examinations, unspecified: Secondary | ICD-10-CM

## 2019-11-28 NOTE — Telephone Encounter (Signed)
Patient dropped off FMLA and Disability forms to be completed. I completed what I could from your notes please review and fill in highlighted areas date and sign in your yellow folder.
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
## 2019-11-28 NOTE — Telephone Encounter (Signed)
Please advise. Thank you

## 2019-11-29 ENCOUNTER — Other Ambulatory Visit (HOSPITAL_COMMUNITY): Payer: BC Managed Care – PPO | Attending: Psychiatry | Admitting: Licensed Clinical Social Worker

## 2019-11-29 ENCOUNTER — Encounter (HOSPITAL_COMMUNITY): Payer: Self-pay

## 2019-11-29 ENCOUNTER — Encounter (HOSPITAL_COMMUNITY): Payer: Self-pay | Admitting: Specialist

## 2019-11-29 ENCOUNTER — Other Ambulatory Visit: Payer: Self-pay

## 2019-11-29 ENCOUNTER — Other Ambulatory Visit (HOSPITAL_COMMUNITY): Payer: BC Managed Care – PPO | Admitting: Specialist

## 2019-11-29 DIAGNOSIS — F332 Major depressive disorder, recurrent severe without psychotic features: Secondary | ICD-10-CM

## 2019-11-29 DIAGNOSIS — Z87891 Personal history of nicotine dependence: Secondary | ICD-10-CM | POA: Diagnosis not present

## 2019-11-29 DIAGNOSIS — M797 Fibromyalgia: Secondary | ICD-10-CM | POA: Diagnosis not present

## 2019-11-29 DIAGNOSIS — Z658 Other specified problems related to psychosocial circumstances: Secondary | ICD-10-CM | POA: Diagnosis not present

## 2019-11-29 DIAGNOSIS — R630 Anorexia: Secondary | ICD-10-CM | POA: Diagnosis not present

## 2019-11-29 DIAGNOSIS — Z79899 Other long term (current) drug therapy: Secondary | ICD-10-CM | POA: Diagnosis not present

## 2019-11-29 DIAGNOSIS — R519 Headache, unspecified: Secondary | ICD-10-CM | POA: Diagnosis not present

## 2019-11-29 DIAGNOSIS — R4589 Other symptoms and signs involving emotional state: Secondary | ICD-10-CM

## 2019-11-29 DIAGNOSIS — R41844 Frontal lobe and executive function deficit: Secondary | ICD-10-CM

## 2019-11-29 DIAGNOSIS — G479 Sleep disorder, unspecified: Secondary | ICD-10-CM | POA: Insufficient documentation

## 2019-11-29 DIAGNOSIS — R410 Disorientation, unspecified: Secondary | ICD-10-CM | POA: Diagnosis not present

## 2019-11-29 DIAGNOSIS — R42 Dizziness and giddiness: Secondary | ICD-10-CM | POA: Diagnosis not present

## 2019-11-29 DIAGNOSIS — E079 Disorder of thyroid, unspecified: Secondary | ICD-10-CM | POA: Diagnosis not present

## 2019-11-29 DIAGNOSIS — F419 Anxiety disorder, unspecified: Secondary | ICD-10-CM | POA: Insufficient documentation

## 2019-11-29 DIAGNOSIS — Z818 Family history of other mental and behavioral disorders: Secondary | ICD-10-CM | POA: Insufficient documentation

## 2019-11-29 NOTE — Progress Notes (Signed)
Spoke with patient via Webex video call, used 2 identifiers to correctly identify patient. She states that she was recommended for the program by her PCP due to stress and anxiety. She has been on multiple medications in the past year and believes Cymbalta caused impairment in her memory. This is her first time in PHP. 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 She had COVID in Nov. Takes Zanaflex but believes it may be causing her to be tired during the day. She does not take the daytime dosages and only takes it at night. She falls asleep quickly when she takes it. On scale 1-10 as 10 being worst she rates depression at 5 and anxiety at 0. Denies SI/HI or AV hallucinations. No other issues or complaints. PHQ9=24. Today is her first day in group and it has went well so far.

## 2019-11-29 NOTE — Psych (Signed)
Virtual Visit via Video Note  I connected with Mary [name] on 11/29/19 at  9:00 AM EST by a video enabled telemedicine application and verified that I am speaking with the correct person using two identifiers.   I discussed the limitations of evaluation and management by telemedicine and the availability of in person appointments. The patient expressed understanding and agreed to proceed.   Follow Up Instructions:    I discussed the assessment and treatment plan with the patient. The patient was provided an opportunity to ask questions and all were answered. The patient agreed with the plan and demonstrated an understanding of the instructions.   The patient was advised to call back or seek an in-person evaluation if the symptoms worsen or if the condition fails to improve as anticipated.  I provided 10 minutes of non-face-to-face time during this encounter.  Patient verbally agrees to treatment plan.  Quinn Axe, [facility]

## 2019-11-29 NOTE — Therapy (Addendum)
Mary [name], Mary [name], Mary [name] Phone: [phone]   Fax:  [phone]   Virtual Visit via Video Note  I connected with Mary [name] on 11/29/19 at  11:00 AM EST by a video enabled telemedicine application and verified that I am speaking with the correct person using two identifiers.  I discussed the limitations of evaluation and management by telemedicine and the availability of in person appointments. The patient expressed understanding and agreed to proceed.     I discussed the assessment and treatment plan with the patient. The patient was provided an opportunity to ask questions and all were answered. The patient agreed with the plan and demonstrated an understanding of the instructions.   The patient was advised to call back or seek an in-person evaluation if the symptoms worsen or if the condition fails to improve as anticipated.  I provided 100 minutes of non-face-to-face time during this encounter.   Vangie Bicker, [place], OTR/L [phone]    Occupational Therapy Evaluation  Patient Details  Name: Mary [name] MRN: [id number] Date of Birth: 1966-02-14 No data recorded  Encounter Date: 11/29/2019  OT End of Session - 11/29/19 1408    Visit Number  1    Number of Visits  8    Date for OT Re-Evaluation  12/20/19    Authorization Type  [employer]    OT Start Time  1100    OT Stop Time  1240    OT Time Calculation (min)  100 min    Activity Tolerance  Patient tolerated treatment well    Behavior During Therapy  [facility] for tasks assessed/performed       Past Medical History:  Diagnosis Date  . Fibromyalgia   . Migraine   . Thyroid disease     Past Surgical History:  Procedure Laterality Date  . TONSILLECTOMY    . TUBAL LIGATION    . uterine ablation      There were no vitals filed for this visit.  Subjective Assessment - 11/29/19 1407    Currently in Pain?   No/denies       OT assessment Diagnosis  Major Depressive Disorder  Past medical history/referral information:  fibromyalgia Living situation:  Lives with spouse, has 3 adult children ADLs:  independent Work:  Press photographer, full time Leisure:  Environmental education officer, bible study, used to enjoy exercise  Social support:  Spouse, poor otherwise Struggles:  Clinical research associate, over analyzing, Electrical engineer OT goal:  Improve my communication skills  [place] Summary of Client Scores:  FACILITATES PARTICIPATION IN OCCUPATION  ALLOWS PARTICIPATION IN OCCUPATION INHIBITS PARTICIPATION IN OCCUPATION RESTRICTS PARTICIPATION IN OCCUPATION COMMENTS  ROLES  x     HABITS   x    PERSONAL CAUSATION   x    VALUES   x    INTERESTS   x    SKILLS   x    SHORT TERM GOALS    x No goals identified, despite stating that accomplishment is important to her  LONG TERM GOALS    x   INTERPETATION OF PAST EXPERIENCES    x No good times, everything now is bad   PHYSICAL ENVIRONMENT  x     SOCIAL ENVIRONMENT   x  No one wants to discuss daughter with her  READINESS FOR CHANGE   x     Need for Occupational Therapy:  4 Shows positive occupational participation, no need for OT.  3 Need for minimal intervention/consultative participation  x 2 Need for OT intervention indicated to restore/improve participation   1 Need for extensive OT intervention indicated to improve participation.  Referral for follow up services also recommended.     Group members began group by sharing their current role and their level of satisfaction and/or desire for change in their current role.  Therapist defined hard skills and soft skills as key components of preparing for, achieving, and maintaining job placement.  Group members were able to identify several soft skills, and therapist then expanded on those identified.  Group discussed the following soft skills at length:  attitude, communication, planning and organization,  critical thinking, interpersonal skills, teamwork, professionalism, media rules, and flexibility.  After defining these soft skills and discussing their key importance in the workplace, group members self-reflected and rated their current skill level with each area from 1 (poor) to 5 (great).  The group shared their scores with the group and then identified 1-2 areas they most wanted to improved.  Mary [name] selected attitude and communication as the areas she would most like to improve.  OT then educated group on several strategies to improve attitude, including beginning each day with a positive thought, planning ahead and organizing for a smoother day, managing your own expectations for the day, setting conversation tones, and reflecting on one good event that happened each day.   A:  Patient demonstrates behavior that inhibits participation in occupation.  Patient will benefit from occupational therapy intervention in order to improve time management, financial management, stress management, job readiness skills, social skills, and health management skills in preparation to return to full time community living and to be a productive community member.   Mary [name] was engage throughout group.  Once educated on what soft skills were, she was able to self-reflect on soft skills that she excelled in, as well as several that she would like to improve upon.   P:  Patient will participate in skilled occupational therapy sessions individually or in a group setting to improve coping skills, psychosocial skills, and emotional skills required to return to prior level of function. Treatment will be 2 times per week for 3 weeks. Group        OT Education - 11/29/19 1407    Education Details  educated patient on occupational therapy purpose and expectations, educated of strategies to improve soft skills in context of employment.    Person(s) Educated  Patient    Methods  Explanation    Comprehension  Verbalized  understanding       OT Short Term Goals - 11/29/19 1410      OT SHORT TERM GOAL #1   Title  Pt will be educated on strategies to improve psychosocial skills needed to participate fully in all daily, work, and leisure activities    Time  3    Period  Weeks    Status  New    Target Date  12/20/19      OT SHORT TERM GOAL #2   Title  Pt will apply psychosocial skills and coping mechanisms to daily activities in order to function independently and reintegrate into community    Time  3    Period  Weeks    Status  New      OT SHORT TERM GOAL #3   Title  Pt will recall and/or apply 1-3 sleep hygiene strategies to improve function in BADL routine upon reintegrating into community    Time  3  Period  Weeks    Status  New      OT SHORT TERM GOAL #4   Title  Pt will engage in goal setting to improve functional BADL/IADL routine upon reintegrating into community    Time  3    Period  Weeks    Status  New      OT SHORT TERM GOAL #5   Title  Patient will recall and apply at least 3 strategies to improve her communication skills needed for improved function at work and other social settings.    Time  3    Period  Weeks    Status  New               Plan - 11/29/19 1409    OT Occupational Profile and History  Problem Focused Assessment - Including review of records relating to presenting problem    Occupational performance deficits (Please refer to evaluation for details):  ADL's;IADL's;Rest and Sleep;Work;Leisure;Social Participation    Body Structure / Function / Physical Skills  ADL    Cognitive Skills  Attention;Perception;Problem Solve;Emotional;Energy/Drive;Thought    Psychosocial Skills  Coping Strategies;Environmental  Adaptations;Interpersonal Interaction;Routines and Behaviors    Rehab Potential  Good    Clinical Decision Making  Limited treatment options, no task modification necessary    Comorbidities Affecting Occupational Performance:  May have comorbidities  impacting occupational performance    Modification or Assistance to Complete Evaluation   No modification of tasks or assist necessary to complete eval    OT Frequency  2x / week    OT Duration  --   3 weeks   OT Treatment/Interventions  Self-care/ADL training;Psychosocial skills training;Coping strategies training;Patient/family education    Consulted and Agree with Plan of Care  Patient       Patient will benefit from skilled therapeutic intervention in order to improve the following deficits and impairments:   Body Structure / Function / Physical Skills: ADL Cognitive Skills: Attention, Perception, Problem Solve, Emotional, Energy/Drive, Thought Psychosocial Skills: Coping Strategies, Environmental  Adaptations, Interpersonal Interaction, Routines and Behaviors   Visit Diagnosis: Difficulty coping  Frontal lobe and executive function deficit  Severe episode of recurrent major depressive disorder, without psychotic features (HCC)    Problem List Patient Active Problem List   Diagnosis Date Noted  . Mixed hyperlipidemia 02/14/2019  . Hypothyroidism 09/26/2018  . Goiter 09/26/2018  . Palpitations 09/25/2015  . Adjustment disorder with mixed anxiety and depressed mood 08/03/2014  . Fibromyalgia affecting multiple sites 05/28/2014    Shirlean Mylar, MHA, OTR/L [phone]  11/29/2019, 2:13 PM  [facility] PARTIAL HOSPITALIZATION PROGRAM [address] [place], [place], [address] Phone: [phone]   Fax:  [phone]  Name: Mary [name] MRN: [id number] Date of Birth: Mar 02, 1966

## 2019-12-02 ENCOUNTER — Other Ambulatory Visit: Payer: Self-pay | Admitting: "Endocrinology

## 2019-12-02 ENCOUNTER — Encounter (HOSPITAL_COMMUNITY): Payer: Self-pay | Admitting: Family

## 2019-12-02 ENCOUNTER — Other Ambulatory Visit: Payer: Self-pay

## 2019-12-02 ENCOUNTER — Other Ambulatory Visit (HOSPITAL_COMMUNITY): Payer: BC Managed Care – PPO | Admitting: Licensed Clinical Social Worker

## 2019-12-02 ENCOUNTER — Telehealth: Payer: Self-pay | Admitting: "Endocrinology

## 2019-12-02 DIAGNOSIS — E038 Other specified hypothyroidism: Secondary | ICD-10-CM

## 2019-12-02 DIAGNOSIS — F332 Major depressive disorder, recurrent severe without psychotic features: Secondary | ICD-10-CM | POA: Diagnosis not present

## 2019-12-02 DIAGNOSIS — R4589 Other symptoms and signs involving emotional state: Secondary | ICD-10-CM

## 2019-12-02 DIAGNOSIS — E063 Autoimmune thyroiditis: Secondary | ICD-10-CM

## 2019-12-02 NOTE — Telephone Encounter (Signed)
Pt states she would like labs ordered. She is not due until April. She said she went to see another provider and they seem to think she needs a thyroid panel done before then. She is depressed, fatigue and has weight gain. Please advise.

## 2019-12-02 NOTE — Telephone Encounter (Signed)
Spoke w/ patient

## 2019-12-02 NOTE — Progress Notes (Signed)
Virtual Visit via Telephone Note  I connected with [name] on 12/02/19 at  9:00 AM EST by telephone and verified that I am speaking with the correct person using two identifiers.   I discussed the limitations, risks, security and privacy concerns of performing an evaluation and management service by telephone and the availability of in person appointments. I also discussed with the patient that there may be a patient responsible charge related to this service. The patient expressed understanding and agreed to proceed.    I discussed the assessment and treatment plan with the patient. The patient was provided an opportunity to ask questions and all were answered. The patient agreed with the plan and demonstrated an understanding of the instructions.   The patient was advised to call back or seek an in-person evaluation if the symptoms worsen or if the condition fails to improve as anticipated.  I provided 45 minutes of non-face-to-face time during this encounter.   [name], NP    Behavioral Health Partial Program Assessment Note  Date: 12/02/2019 Name: Mary [name] MRN: [id number]   HPI: Mary [name] is a 54 y.o. Caucasian female presents with ongoing depression.  Dezaree reports she was referred by her primary care provider due to multiple medication changes.  States she does not feel as if her depression or anxiety has improved.  Anastazja reports her primary care provider prescribed 120 mg of Cymbalta however she started feeling head tightness and "zaps" throughout her head due to high dose of Cymbalta.  Stated that she self titrated herself to 60 mg daily and feels little better with that dosing however continues to feel depressed.  Reported medical history with fibromyalgia and thyroid disorder.  Patient reports symptoms of chronic fatigue, depression, anxiety, mood irritability and poor relationship with others.  Denied previous suicide attempts.  Reports a  poor appetite.  Reports interrupted unrestful sleep.  Patient was enrolled in partial psychiatric program on 12/02/19.  Primary complaints include: anxiety, anxiety attacks, depression worse, difficulty sleeping, feeling depressed, poor concentration and stressed at work.  Onset of symptoms was gradual with gradually worsening course since that time. Psychosocial Stressors include the following: family, financial, health and marital.   Reighlyn reported family history of mental illness.  Maternal grandmother and mother depression/anxiety.  Paternal uncle committed suicide.  Patient's daughter 58 years old-reported struggling with substance abuse and was recently discharged from jail.  Currently in drug rehabilitation.  Stated she was followed by [facility] psychiatry 20+ years prior.    Reported taking Lamictal, Prozac and Depakote.  Without any relief.  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  I have reviewed the following documentation dated 11/2019: past psychiatric history, past medical history and past social and family history  Complaints of Pain: headaches and level of pain (1-10, 10 severe),  Past Psychiatric History:  Past medication trials  and Drug/alcohol reported history of alcohol dependency currently denies alcohol use at this time.  Currently in treatment with Cymbalta 60 mg daily  Substance Abuse History: alcohol Use of Alcohol: denied Use of Caffeine: denies use Use of over the counter:   Past Surgical History:  Procedure Laterality Date  . TONSILLECTOMY    . TUBAL LIGATION    . uterine ablation      Past Medical History:  Diagnosis Date  . Fibromyalgia   . Migraine   . Thyroid disease    Outpatient Encounter  Medications as of 12/02/2019  Medication Sig Note  . acetaminophen (TYLENOL) 325 MG tablet Take 2 tablets (650 mg total) by mouth every 6 (six) hours as needed for mild pain, moderate  pain or headache.   . albuterol (VENTOLIN HFA) 108 (90 Base) MCG/ACT inhaler Inhale 2 puffs po QID prn wheezing   . ALPRAZolam (XANAX) 0.25 MG tablet Take 0.25 mg by mouth 3 (three) times daily as needed.   . diphenhydrAMINE (BENADRYL) 25 MG tablet Take 1 tablet (25 mg total) by mouth every 6 (six) hours.   . DULoxetine (CYMBALTA) 60 MG capsule Take 60 mg by mouth 2 (two) times daily.   . famotidine (PEPCID) 20 MG tablet Take 1 tablet (20 mg total) by mouth 2 (two) times daily. 06/21/2019: prn  . levothyroxine (EUTHYROX) 50 MCG tablet TAKE 1 TABLET BY MOUTH ONCE DAILY BEFORE BREAKFAST   . ondansetron (ZOFRAN) 8 MG tablet Take 1 tablet (8 mg total) by mouth every 8 (eight) hours as needed for nausea.   . rosuvastatin (CRESTOR) 5 MG tablet Take 1 tablet (5 mg total) by mouth daily.   Marland Kitchen tiZANidine (ZANAFLEX) 4 MG tablet Take one half tablet qam, one half tablet in the evening and one whole tablet qhs 11/29/2019: Taking 1 at night only  . traMADol (ULTRAM) 50 MG tablet Take 50-100 mg by mouth every 6 (six) hours as needed.   . triamcinolone cream (KENALOG) 0.1 % Apply 1 application topically 2 (two) times daily.   . valACYclovir (VALTREX) 1000 MG tablet Take 1 tablet (1,000 mg total) by mouth 3 (three) times daily.   . Vitamin D, Ergocalciferol, (DRISDOL) 1.25 MG (50000 UT) CAPS capsule Take 50,000 Units by mouth once a week.    No facility-administered encounter medications on file as of 12/02/2019.   Allergies  Allergen Reactions  . Augmentin [Amoxicillin-Pot Clavulanate]     swelling  . Bactrim [Sulfamethoxazole-Trimethoprim]     Lips swelling    Social History   Tobacco Use  . Smoking status: Former Smoker    Types: Cigarettes  . Smokeless tobacco: Never Used  . Tobacco comment: quit feb 2016  Substance Use Topics  . Alcohol use: No    Alcohol/week: 0.0 standard drinks   Functioning Relationships: good support system and strained with co-workers Education: Other (Specify any learning  disability, behavioral disorder, special education needs, difficulty reading.):  Other Pertinent History: None Family History  Problem Relation Age of Onset  . Heart disease Mother   . Depression Mother   . Heart disease Father   . Depression Father   . Depression Maternal Uncle      Review of Systems Behavioral/Psych: negative  Objective:  There were no vitals filed for this visit.  Physical Exam:   Mental Status Exam: Appearance:  Well groomed Psychomotor::  Psychomotor Agitation Attention span and concentration: Normal Behavior: calm, cooperative and adequate rapport can be established Speech:  normal pitch and normal volume Mood:  depressed and anxious Affect:  normal Thought Process:  Coherent Thought Content:  Logical Orientation:  person, place and time/date Cognition:  grossly intact Insight:  Fair Judgment:  Fair Estimate of Intelligence: Average Fund of knowledge: Aware of current events Memory: Recent and remote intact Abnormal movements: None Gait and station: Normal  Assessment:  Diagnosis: MDD (major depressive disorder), recurrent severe, without psychosis ([place]) [F33.2] 1. MDD (major depressive disorder), recurrent severe, without psychosis ([place])   2. Difficulty coping     Indications for admission:  Plan: patient enrolled  in Partial Hospitalization Program, patient's current medications are to be continued, a comprehensive treatment plan will be developed and side effects of medications have been reviewed with patient -Discussed initiating mood stabilization medication pending lab results -Keep follow-up with primary care provider for updated TSH/thyroid panel -Follow-up in office for GeneSight testing  Treatment options and alternatives reviewed with patient and patient understands the above plan.  Treatment plan was reviewed and agreed upon by NP Chilton Greathouse and patient Loretha Stapler need for group services.    [name], NP

## 2019-12-02 NOTE — Telephone Encounter (Signed)
She can proceed to obtain her labs as soon as possible.

## 2019-12-03 ENCOUNTER — Other Ambulatory Visit (HOSPITAL_COMMUNITY): Payer: BC Managed Care – PPO

## 2019-12-03 ENCOUNTER — Other Ambulatory Visit: Payer: Self-pay

## 2019-12-03 LAB — TSH: TSH: 1.37 mIU/L

## 2019-12-03 LAB — T4, FREE: Free T4: 1.1 ng/dL (ref 0.8–1.8)

## 2019-12-04 ENCOUNTER — Other Ambulatory Visit: Payer: Self-pay

## 2019-12-04 ENCOUNTER — Other Ambulatory Visit (HOSPITAL_COMMUNITY): Payer: BC Managed Care – PPO | Admitting: Licensed Clinical Social Worker

## 2019-12-04 DIAGNOSIS — F332 Major depressive disorder, recurrent severe without psychotic features: Secondary | ICD-10-CM | POA: Diagnosis not present

## 2019-12-04 NOTE — Progress Notes (Signed)
Spoke with  Patient via Web ex video call, used 2 identifiers to correctly identify patient. She states groups are going well and she is enjoying them. She is going to get the genetic testing done to see what antidepressant she can switch to. She feels like Cymbalta is not working and that it makes her anger worse. She recently had to decrease it to once daily. Denies SI/HI or AV hallucinations.On scale of 1-10 as 10 being worst she rates depression at 5 and anxiety at 0. No other issues or complaints.

## 2019-12-04 NOTE — Psych (Signed)
Virtual Visit via Video Note  I connected with Mary [name] on 12/02/19 at  9:00 AM EST by a video enabled telemedicine application and verified that I am speaking with the correct person using two identifiers.   I discussed the limitations of evaluation and management by telemedicine and the availability of in person appointments. The patient expressed understanding and agreed to proceed.  I discussed the assessment and treatment plan with the patient. The patient was provided an opportunity to ask questions and all were answered. The patient agreed with the plan and demonstrated an understanding of the instructions.   The patient was advised to call back or seek an in-person evaluation if the symptoms worsen or if the condition fails to improve as anticipated.  Pt was provided 240 minutes of non-face-to-face time during this encounter.   [name], LCSW    [facility] [place] PHP THERAPIST PROGRESS NOTE  Mary [name] [id number]  Session Time: 9:00 - 10:00  Participation Level: Active  Behavioral Response: CasualAlertDepressed  Type of Therapy: Group Therapy  Treatment Goals addressed: Coping  Interventions: CBT, DBT, Supportive and Reframing  Summary: Clinician led check-in regarding current stressors and situation, and review of patient completed daily inventory. Clinician utilized active listening and empathetic response and validated patient emotions. Clinician facilitated processing group on pertinent issues.   Therapist Response:Mary [name] is a 54 y.o. female who presents with depression symptoms. Patient arrived within time allowed and reports that she is feeling "okay, but still angry." Patient rates hermood at a 7on a scale of 1-10 with 10 being great. Pt reports her weekend was "pretty good" and she had a positive time with her best friend on Saturday and worked around the house with her husband on Sunday. Pt reports struggling with energy on Sunday  and Sunday night work worries "came flooding back in." Pt reports struggling with opening up to her oldest daughter about what is going on. Pt is able to process. Patient engaged in discussion.       Session Time: 10:00-11:00  Participation Level:Active  Behavioral Response:CasualAlertDepressed  Type of Therapy: Group Therapy, psychoeducation, psychotherapy  Treatment Goals addressed: Coping  Interventions:CBT, DBT, Solution Focused, Supportive and Reframing  Summary:Cln led discussion on living an "active" versus a "passive" life, incorporating DBT radical acceptance and CBT reframing concepts. Group shared ways in which they have let life happen "to" them and how it might look to be more active in what they accept and the decisions they make. Cln encouraged pt's to offer themselves grace, reminding that we cannot do anything about something we did not know was an issue or have anyway to address.   Therapist Response:  Pt engaged in discussion and states she feels she has lived a passive life and often felt powerless to other people's actions and demands. Pt struggles to reframe however is accepting new ideas and trying to find their place for her.       Session Time: 11:00- 12:00  Participation Level:Active  Behavioral Response:CasualAlertDepressed  Type of Therapy: Group Therapy, psychoeducation, psychotherapy  Treatment Goals addressed: Coping  Interventions:CBT, DBT, Solution Focused, Supportive and Reframing  Summary:Cln introduced topic of DBT distress tolerance skills. Cln discussed emotional reactivity including what it is and examples of how it presents. Group discussed how they handle emotional stress. Cln offered distress tolerance skills as a way to address emotional reactivity by increasing ability to tolerate negative emotions.   Therapist Response:  Pt engaged in discussion and identifies that she  has high emotional reactivity. Pt  shares that her current work issues were affected by her being unable to manage her emotional reaction.      Session Time: 12:00 -1:00  Participation Level:Active  Behavioral Response:CasualAlertDepressed  Type of Therapy: Group Therapy, Psychoeducation; Psychotherapy  Treatment Goals addressed: Coping  Interventions:CBT; Solution focused; Supportive; Reframing  Summary:12:00 - 12:50:Cln continued topic of Distress Tolerance and introduced ACCEPTS skills. Cln provided context for the role distraction can play in coping. Group reviewed A- C-C of the ACCEPTS skills and group members identifies ways they can practice these skills in their every day life. 12:50 -1:00 Clinician led check-out. Clinician assessed for immediate needs, medication compliance and efficacy, and safety concerns  Therapist Response:12:00 - 12:50:Pt engaged in discussion and reports understanding of distraction as a coping mechanism. Pt identifies activities of reading, watching tv, and listening to music as ways she can practice.  12:50 - 1:00: At check-out, patient rates her mood at a 8 on a scale of 1-10 with 10 being great.Pt states afternoon plans of taking a nap and researching water exercise groups. Patient demonstrates someprogress as evidenced by recognizing problematic behaviors.Patient denies SI/HI/self-harm at the end of group.    Suicidal/Homicidal: Nowithout intent/plan  Plan: Pt will continue in PHP while working to decrease depression symptoms, increase ability to manage symptoms in a healthy manner, and increase emotional regulation.  Diagnosis: MDD (major depressive disorder), recurrent severe, without psychosis (HCC) [F33.2]    1. MDD (major depressive disorder), recurrent severe, without psychosis (HCC)   2. Difficulty coping       Donia Guiles, LCSW 12/04/2019

## 2019-12-04 NOTE — Psych (Signed)
Virtual Visit via Video Note  I connected with Mary [name] on 11/29/19 at  9:00 AM EST by a video enabled telemedicine application and verified that I am speaking with the correct person using two identifiers.   I discussed the limitations of evaluation and management by telemedicine and the availability of in person appointments. The patient expressed understanding and agreed to proceed.  I discussed the assessment and treatment plan with the patient. The patient was provided an opportunity to ask questions and all were answered. The patient agreed with the plan and demonstrated an understanding of the instructions.   The patient was advised to call back or seek an in-person evaluation if the symptoms worsen or if the condition fails to improve as anticipated.  Pt was provided 240 minutes of non-face-to-face time during this encounter.   Donia Guiles, LCSW    [facility] PHP THERAPIST PROGRESS NOTE  Mary [name] [id number]  Session Time: 9:00 - 10:00  Participation Level: Active  Behavioral Response: CasualAlertDepressed  Type of Therapy: Group Therapy  Treatment Goals addressed: Coping  Interventions: CBT, DBT, Supportive and Reframing  Summary: Clinician led check-in regarding current stressors and situation, and review of patient completed daily inventory. Clinician utilized active listening and empathetic response and validated patient emotions. Clinician facilitated processing group on pertinent issues.   Therapist Response:Mary [name] is a 54 y.o. female who presents with depression symptoms. Patient arrived within time allowed and reports that she is feeling "in the middle." Patient rates hermood at a 5on a scale of 1-10 with 10 being great. Pt reports she is feeling "exhausted" and continues to hold anger about her work situation. Pt states she spent her day yesterday around the house and canceled some things she had planned due to low energy and  uneven mood. Pt struggles to move past not understanding the motivation of her coworkers and managing the anger that she feels. Pt is able to process. Patient engaged in discussion.       Session Time: 10:00-11:00  Participation Level:Active  Behavioral Response:CasualAlertDepressed  Type of Therapy: Group Therapy, psychoeducation, psychotherapy  Treatment Goals addressed: Coping  Interventions:CBT, DBT, Solution Focused, Supportive and Reframing  Summary:Cln led discussion on planning ahead as an anxiety management tool. Cln introduced concept of "scripting" as well as planning generic phrases to use in unplanned for situations. Group members shared current anxieties and practiced planning ahead.  Therapist Response:  Pt engaged in discussion and activity. Pt expresses concern about upcoming job interviews. Pt is able to determine ways she can answer questions which she was concerned about.       Session Time: 11:00- 12:00  Participation Level:Active  Behavioral Response:CasualAlertDepressed  Type of Therapy: Group Therapy, OT  Treatment Goals addressed: Coping  Interventions:Psychosocial skills training, Supportive,   Summary:Occupational Therapy group  Therapist Response:Patient engaged in group. See OT note.       Session Time: 12:00 -1:00  Participation Level:Active  Behavioral Response:CasualAlertDepressed  Type of Therapy: Group Therapy, Psychoeducation; Psychotherapy  Treatment Goals addressed: Coping  Interventions:CBT; Solution focused; Supportive; Reframing  Summary:12:00 - 12:50:Cln introduced topic of Distress Tolerance skills.  Cln utilized DBT framework to discusse the purpose of distress tolerance skills and how to apply them. Cln reviewed ACCEPTS skills and group discussed ways to practice these skills.  12:50 -1:00 Clinician led check-out. Clinician assessed for immediate needs, medication  compliance and efficacy, and safety concerns  Therapist Response:12:00 - 12:50:Pt engaged in discussion and reports understanding of ACCEPTS skills.  Pt missed part of this session due to meeting with RN, A. Kinlaw.  12:50 - 1:00: At check-out, patient rates her mood at a 7 on a scale of 1-10 with 10 being great.Pt states afternoon plans of doing a phone interview, reading, and trying to stay calm. Patient demonstrates someprogress as evidenced by participating in first group session.Patient denies SI/HI/self-harm at the end of group.    Suicidal/Homicidal: Nowithout intent/plan  Plan: Pt will continue in PHP while working to decrease depression symptoms, increase ability to manage symptoms in a healthy manner, and increase emotional regulation.  Diagnosis: Severe episode of recurrent major depressive disorder, without psychotic features ([place]) [F33.2]    1. Severe episode of recurrent major depressive disorder, without psychotic features ([place])       [name], LCSW 12/04/2019

## 2019-12-05 ENCOUNTER — Ambulatory Visit (INDEPENDENT_AMBULATORY_CARE_PROVIDER_SITE_OTHER): Payer: BC Managed Care – PPO | Admitting: "Endocrinology

## 2019-12-05 ENCOUNTER — Encounter: Payer: Self-pay | Admitting: "Endocrinology

## 2019-12-05 ENCOUNTER — Other Ambulatory Visit (HOSPITAL_COMMUNITY): Payer: BC Managed Care – PPO

## 2019-12-05 ENCOUNTER — Other Ambulatory Visit: Payer: Self-pay

## 2019-12-05 ENCOUNTER — Other Ambulatory Visit (HOSPITAL_COMMUNITY): Payer: BC Managed Care – PPO | Admitting: Licensed Clinical Social Worker

## 2019-12-05 DIAGNOSIS — F332 Major depressive disorder, recurrent severe without psychotic features: Secondary | ICD-10-CM | POA: Diagnosis not present

## 2019-12-05 DIAGNOSIS — E782 Mixed hyperlipidemia: Secondary | ICD-10-CM | POA: Diagnosis not present

## 2019-12-05 DIAGNOSIS — E063 Autoimmune thyroiditis: Secondary | ICD-10-CM | POA: Diagnosis not present

## 2019-12-05 DIAGNOSIS — E038 Other specified hypothyroidism: Secondary | ICD-10-CM | POA: Diagnosis not present

## 2019-12-05 MED ORDER — SYNTHROID 75 MCG PO TABS: 75.0000 ug | ORAL_TABLET | Freq: Every day | ORAL | 2 refills | Status: DC

## 2019-12-05 NOTE — Progress Notes (Signed)
Spiritual care group 12/04/2019 11:00-12:00  Group met via web-ex due to COVID-19 precautions.  Group facilitated by Simone Curia, MDiv, [facility]  Group focused on topic of "self-care"  Patients engaged in facilitated discussion about topic.  Explored quotes related to self care and chose one which they agreed with and one which they disliked.  Engaged in discussion around quote choices and their experience / understanding of care for themselves.   Mary [name] was present throughout group.  Actively engaged in discussion and activity.  Noted that she has had a shift in self-care after her children are grown not longer in home.  She feels less guilt in claiming space for herself - but still feels this with her boss at work.  She engaged with another group member around the dynamics of "being a people pleaser" and reflected on what she hopes for when she thinks of care for herself.  She reflected on her faith and categories which encourage honoring self and self-care.    Jerene Pitch, MDiv, [facility]  Lead Clinical Chaplain  [facility] [place]

## 2019-12-05 NOTE — Progress Notes (Signed)
12/05/2019, 3:19 PM                                Endocrinology Telehealth Visit Follow up Note -During COVID -19 Pandemic  I connected with Mary [name] on 12/05/2019   by telephone and verified that I am speaking with the correct person using two identifiers. Mary [name], [birth year]. she has verbally consented to this visit. All issues noted in this document were discussed and addressed. The format was not optimal for physical exam.   Mary [name] is a 54 y.o.-year-old female patient who is engaged in telehealth via telephone  for follow-up of hypothyroidism.  She also has significant hyperlipidemia .   Past Medical History:  Diagnosis Date  . Fibromyalgia   . Migraine   . Thyroid disease    Past Surgical History:  Procedure Laterality Date  . TONSILLECTOMY    . TUBAL LIGATION    . uterine ablation     Social History   Socioeconomic History  . Marital status: Married    Spouse name: Not on file  . Number of children: Not on file  . Years of education: Not on file  . Highest education level: Not on file  Occupational History  . Not on file  Tobacco Use  . Smoking status: Former Smoker    Types: Cigarettes  . Smokeless tobacco: Never Used  . Tobacco comment: quit feb 2016  Substance and Sexual Activity  . Alcohol use: No    Alcohol/week: 0.0 standard drinks  . Drug use: No  . Sexual activity: Yes    Birth control/protection: Surgical  Other Topics Concern  . Not on file  Social History Narrative  . Not on file   Social Determinants of Health   Financial Resource Strain:   . Difficulty of Paying Living Expenses: Not on file  Food Insecurity:   . Worried About Programme researcher, broadcasting/film/video in the Last Year: Not on file  . Ran Out of Food in the Last Year: Not on file  Transportation Needs:   . Lack of Transportation (Medical): Not on  file  . Lack of Transportation (Non-Medical): Not on file  Physical Activity:   . Days of Exercise per Week: Not on file  . Minutes of Exercise per Session: Not on file  Stress:   . Feeling of Stress : Not on file  Social Connections:   . Frequency of Communication with Friends and Family: Not on file  . Frequency of Social Gatherings with Friends and Family: Not on file  . Attends Religious Services: Not on file  . Active Member of Clubs or Organizations: Not on file  . Attends Banker Meetings: Not on file  . Marital Status: Not on  file   Outpatient Encounter Medications as of 12/05/2019  Medication Sig  . acetaminophen (TYLENOL) 325 MG tablet Take 2 tablets (650 mg total) by mouth every 6 (six) hours as needed for mild pain, moderate pain or headache. (Patient not taking: Reported on 12/04/2019)  . albuterol (VENTOLIN HFA) 108 (90 Base) MCG/ACT inhaler Inhale 2 puffs po QID prn wheezing (Patient not taking: Reported on 12/04/2019)  . ALPRAZolam (XANAX) 0.25 MG tablet Take 0.25 mg by mouth 3 (three) times daily as needed.  . diphenhydrAMINE (BENADRYL) 25 MG tablet Take 1 tablet (25 mg total) by mouth every 6 (six) hours. (Patient not taking: Reported on 12/04/2019)  . DULoxetine (CYMBALTA) 60 MG capsule Take 60 mg by mouth 2 (two) times daily.  . famotidine (PEPCID) 20 MG tablet Take 1 tablet (20 mg total) by mouth 2 (two) times daily.  . ondansetron (ZOFRAN) 8 MG tablet Take 1 tablet (8 mg total) by mouth every 8 (eight) hours as needed for nausea.  . rosuvastatin (CRESTOR) 5 MG tablet Take 1 tablet (5 mg total) by mouth daily.  Marland Kitchen SYNTHROID 75 MCG tablet Take 1 tablet (75 mcg total) by mouth daily before breakfast.  . tiZANidine (ZANAFLEX) 4 MG tablet Take one half tablet qam, one half tablet in the evening and one whole tablet qhs  . traMADol (ULTRAM) 50 MG tablet Take 50-100 mg by mouth every 6 (six) hours as needed.  . triamcinolone cream (KENALOG) 0.1 % Apply 1 application  topically 2 (two) times daily. (Patient not taking: Reported on 12/04/2019)  . valACYclovir (VALTREX) 1000 MG tablet Take 1 tablet (1,000 mg total) by mouth 3 (three) times daily.  . Vitamin D, Ergocalciferol, (DRISDOL) 1.25 MG (50000 UT) CAPS capsule Take 50,000 Units by mouth once a week.  . [DISCONTINUED] levothyroxine (EUTHYROX) 50 MCG tablet TAKE 1 TABLET BY MOUTH ONCE DAILY BEFORE BREAKFAST   No facility-administered encounter medications on file as of 12/05/2019.   ALLERGIES: Allergies  Allergen Reactions  . Augmentin [Amoxicillin-Pot Clavulanate]     swelling  . Bactrim [Sulfamethoxazole-Trimethoprim]     Lips swelling   VACCINATION STATUS:  There is no immunization history on file for this patient.   HPI    Mary [name]  is a patient with the above medical history. she was diagnosed  with hypothyroidism at approximate age of 67 years with lab work preceded by suggestive clinical symptoms.  She is currently on levothyroxine 50 mcg p.o. daily before breakfast.  She had codeine before her scheduled appointment due to fatigue, weight gain, and depression.  -Her previsit thyroid function tests are consistent with appropriate replacement. -She is requesting brand name Synthroid over levothyroxine. - She has been managed for fibromyalgia.  -She is known to have mild goiter from ultrasound of the thyroid in 2012, her repeat thyroid ultrasound confirms shrinking bilateral thyroid lobes as well as left lobe thyroid nodule shrinking from 2 cm to 1.3 cm.  she reports family history of  thyroid disorders in her mother.  No family history of thyroid cancer.  No history of  radiation therapy to head or neck.    ROS: Limited as above. Physical Exam: There were no vitals taken for this visit. Wt Readings from Last 3 Encounters:  09/08/19 170 lb (77.1 kg)  06/28/19 179 lb (81.2 kg)  03/19/19 170 lb (77.1 kg)     CMP ( most recent) CMP     Component Value Date/Time   NA 142  03/19/2019 1156  NA 140 04/19/2018 0830   K 3.6 03/19/2019 1156   CL 105 03/19/2019 1156   CO2 25 03/19/2019 1156   GLUCOSE 74 03/19/2019 1156   BUN 13 03/19/2019 1156   BUN 17 04/19/2018 0830   CREATININE 0.66 03/19/2019 1156   CALCIUM 9.0 03/19/2019 1156   PROT 6.7 04/19/2018 0830   ALBUMIN 4.5 04/19/2018 0830   AST 16 04/19/2018 0830   ALT 18 04/19/2018 0830   ALKPHOS 79 04/19/2018 0830   BILITOT 0.3 04/19/2018 0830   GFRNONAA >60 03/19/2019 1156   GFRAA >60 03/19/2019 1156     Lipid Panel ( most recent) Lipid Panel     Component Value Date/Time   CHOL 217 (H) 08/16/2019 0742   CHOL 206 (H) 04/19/2018 0830   TRIG 89 08/16/2019 0742   HDL 44 (L) 08/16/2019 0742   HDL 49 04/19/2018 0830   CHOLHDL 4.9 08/16/2019 0742   LDLCALC 154 (H) 08/16/2019 0742       Lab Results  Component Value Date   TSH 1.37 12/03/2019   TSH 0.94 08/16/2019   TSH 2.260 02/12/2019   TSH 1.370 09/26/2018   TSH 1.310 04/19/2018   TSH 1.150 11/29/2016   TSH 1.620 09/25/2015   TSH 0.964 02/18/2013   FREET4 1.1 12/03/2019   FREET4 1.0 08/16/2019   FREET4 1.25 02/12/2019   FREET4 1.23 09/26/2018      ASSESSMENT: 1. Hypothyroidism due to Hashimoto's thyroiditis 2.  Hyperlipidemia  PLAN:   -Her previsit TFTs are c/w appropriate replacement.  However, she would benefit from slight increase in her levothyroxine, and will be switched to Synthroid.  I discussed and prescribed Synthroid 75 mcg p.o. daily before breakfast.  - We discussed about the correct intake of her thyroid hormone, on empty stomach at fasting, with water, separated by at least 30 minutes from breakfast and other medications,  and separated by more than 4 hours from calcium, iron, multivitamins, acid reflux medications (PPIs). -Patient is made aware of the fact that thyroid hormone replacement is needed for life, dose to be adjusted by periodic monitoring of thyroid function tests.  Her thyroid ultrasound confirms  shrinking bilateral thyroid lobes as well as left-sided thyroid nodule shrinking from 2 cm to 1.3 cm.  No intervention necessary at this time.  For her severe hyperlipidemia, she was given a rx for Crestor, never picked it up from the pharmacy.  Her most recent  LDL is 154, she will have repeat fasting lipid panel before her next visit.      - Time spent on this patient care encounter:  25 minutes of which 50% was spent in  counseling and the rest reviewing  her current and  previous labs / studies and medications  doses and developing a plan for long term care. Mary [name]  participated in the discussions, expressed understanding, and voiced agreement with the above plans.  All questions were answered to her satisfaction. she is encouraged to contact clinic should she have any questions or concerns prior to her return visit.   Return in about 3 months (around 03/03/2020) for Follow up with Pre-visit Labs.  Marquis Lunch, MD [facility] Group [facility] [address] [place], [place] [address] Phone: [phone]  Fax: [phone]   12/05/2019, 3:19 PM  This note was partially dictated with voice recognition software. Similar sounding words can be transcribed inadequately or may not  be corrected upon review.

## 2019-12-06 ENCOUNTER — Other Ambulatory Visit (HOSPITAL_COMMUNITY): Payer: BC Managed Care – PPO | Admitting: Specialist

## 2019-12-06 ENCOUNTER — Other Ambulatory Visit: Payer: Self-pay

## 2019-12-06 ENCOUNTER — Other Ambulatory Visit (HOSPITAL_COMMUNITY): Payer: BC Managed Care – PPO | Admitting: Licensed Clinical Social Worker

## 2019-12-06 ENCOUNTER — Encounter (HOSPITAL_COMMUNITY): Payer: Self-pay

## 2019-12-06 DIAGNOSIS — R41844 Frontal lobe and executive function deficit: Secondary | ICD-10-CM

## 2019-12-06 DIAGNOSIS — R4589 Other symptoms and signs involving emotional state: Secondary | ICD-10-CM

## 2019-12-06 DIAGNOSIS — F332 Major depressive disorder, recurrent severe without psychotic features: Secondary | ICD-10-CM

## 2019-12-06 MED ORDER — TRAZODONE HCL 50 MG PO TABS: 50.0000 mg | ORAL_TABLET | Freq: Every day | ORAL | 0 refills | Status: DC

## 2019-12-06 NOTE — Therapy (Signed)
Virtual Visit via Video Note  I connected with Mary [name] on 12/06/19 at  11:00 AM EST by a video enabled telemedicine application and verified that I am speaking with the correct person using two identifiers.   I discussed the limitations of evaluation and management by telemedicine and the availability of in person appointments. The patient expressed understanding and agreed to proceed.  I discussed the assessment and treatment plan with the patient. The patient was provided an opportunity to ask questions and all were answered. The patient agreed with the plan and demonstrated an understanding of the instructions.   The patient was advised to call back or seek an in-person evaluation if the symptoms worsen or if the condition fails to improve as anticipated.  I provided 55 minutes of non-face-to-face time during this encounter.   Jacqualine Code, [place]  [facility] PARTIAL HOSPITALIZATION PROGRAM [address] [place], [place], [address] Phone: [phone]   Fax:  [phone]  Occupational Therapy Treatment  Patient Details  Name: Mary [name] MRN: [id number] Date of Birth: 07-Jul-1966 No data recorded  Encounter Date: 12/06/2019  OT End of Session - 12/06/19 1511    Visit Number  2    Number of Visits  8    Date for OT Re-Evaluation  12/20/19    Authorization Type  [employer]    Authorization Time Period  30 visits authorized    Authorization - Visit Number  1    Authorization - Number of Visits  30    OT Start Time  1100    OT Stop Time  1155    OT Time Calculation (min)  55 min    Activity Tolerance  Patient tolerated treatment well    Behavior During Therapy  [facility] for tasks assessed/performed       Past Medical History:  Diagnosis Date  . Fibromyalgia   . Migraine   . Thyroid disease     Past Surgical History:  Procedure Laterality Date  . TONSILLECTOMY    . TUBAL LIGATION    . uterine ablation      There were no  vitals filed for this visit.  Subjective Assessment - 12/06/19 1511    Currently in Pain?  No/denies             S:  I read or watch TV before bed.  I find myself lying in bed to look at social media, not really to sleep O: patient participated in skilled OT group focusing on improving sleep hygiene.  Patient was educated and discussed benefits of getting enough sleep, detriments of getting too much or too little sleep.  Patient and group discussed strategies for improving sleep including routines, environmental factors, diet, exercises, calming activities.  Patient was educated on use of sleep diary to identify current sleep patterns and strategies to improve sleep routine.    A:  Selena Batten identified that while out of work she is getting about 10 hours of sleep, going to bed at 10 and waking up at 8.  When she returns to work she will need to wake up at 6, so we discussed adjusting going to bed time to 8-9 to adjust for this.  Selena Batten stated she thought she could go to sleep by 9.  Selena Batten identified that she would try a more strict routine prior to bed and try reading versus social media scrolling. P:  continue skilled OT group 2 times per week for 3 weeks with next  group to focus on strategies for improved time management.                OT Education - 12/06/19 1511    Education Details  educated patient on healthy sleep hygiene strategies and use of a sleep diary    Person(s) Educated  Patient    Methods  Explanation;Handout    Comprehension  Verbalized understanding       OT Short Term Goals - 12/06/19 1513      OT SHORT TERM GOAL #1   Title  Pt will be educated on strategies to improve psychosocial skills needed to participate fully in all daily, work, and leisure activities    Time  3    Period  Weeks    Status  On-going    Target Date  12/20/19      OT SHORT TERM GOAL #2   Title  Pt will apply psychosocial skills and coping mechanisms to daily activities in order to function  independently and reintegrate into community    Time  3    Period  Weeks    Status  On-going      OT SHORT TERM GOAL #3   Title  Pt will recall and/or apply 1-3 sleep hygiene strategies to improve function in BADL routine upon reintegrating into community    Time  3    Period  Weeks    Status  On-going      OT SHORT TERM GOAL #4   Title  Pt will engage in goal setting to improve functional BADL/IADL routine upon reintegrating into community    Time  3    Period  Weeks    Status  On-going      OT SHORT TERM GOAL #5   Title  Patient will recall and apply at least 3 strategies to improve her communication skills needed for improved function at work and other social settings.    Time  3    Period  Weeks    Status  On-going               Plan - 12/06/19 1513    Body Structure / Function / Physical Skills  ADL    Cognitive Skills  Attention;Perception;Problem Solve;Emotional;Energy/Drive;Thought    Psychosocial Skills  Coping Strategies;Environmental  Adaptations;Interpersonal Interaction;Routines and Behaviors       Patient will benefit from skilled therapeutic intervention in order to improve the following deficits and impairments:   Body Structure / Function / Physical Skills: ADL Cognitive Skills: Attention, Perception, Problem Solve, Emotional, Energy/Drive, Thought Psychosocial Skills: Coping Strategies, Environmental  Adaptations, Interpersonal Interaction, Routines and Behaviors   Visit Diagnosis: MDD (major depressive disorder), recurrent severe, without psychosis ([name])  Difficulty coping  Frontal lobe and executive function deficit    Problem List Patient Active Problem List   Diagnosis Date Noted  . Mixed hyperlipidemia 02/14/2019  . Hypothyroidism 09/26/2018  . Goiter 09/26/2018  . Palpitations 09/25/2015  . Adjustment disorder with mixed anxiety and depressed mood 08/03/2014  . Fibromyalgia affecting multiple sites 05/28/2014    Problem List     No episode was linked to this visit.     Vangie Bicker, [place], OTR/L [phone]  12/06/2019, 3:14 PM  [place], [place], [address] Phone: [phone]   Fax:  [phone]  Name: NOEL HENANDEZ MRN: [id number] Date of Birth: 26-Dec-1965

## 2019-12-06 NOTE — Progress Notes (Signed)
Virtual Visit via Telephone Note  I connected with Mary [name] on 12/06/19 at  9:00 AM EST by telephone and verified that I am speaking with the correct person using two identifiers.   I discussed the limitations, risks, security and privacy concerns of performing an evaluation and management service by telephone and the availability of in person appointments. I also discussed with the patient that there may be a patient responsible charge related to this service. The patient expressed understanding and agreed to proceed.  I discussed the assessment and treatment plan with the patient. The patient was provided an opportunity to ask questions and all were answered. The patient agreed with the plan and demonstrated an understanding of the instructions.   The patient was advised to call back or seek an in-person evaluation if the symptoms worsen or if the condition fails to improve as anticipated.  I provided 15 minutes of non-face-to-face time during this encounter.   [name], NP   BH MD/PA/NP OP Progress Note  12/06/2019 10:39 AM Mary [name]  MRN:  [id number]   Evaluation: Mary [name] was evaluated telephonically.  She continues to express worsening symptoms with her depression.  Patient was advised to follow-up with her endocrinologist regarding history of hypothyroidism.  Reported labs came back within normal limits.  Chart review TSH 1.37, free T4 is 1.1.  Patient reported a recent adjustment to her levothyroxine 50 mg to 75 mg) name changed to Synthroid.  Patient reports ongoing symptoms related to Cymbalta.  Reported " I just do not feel right when I take this medication."  Discussed titration of Cymbalta this 60 mg daily X 5 days and then start Cymbalta 30 mg x 1 week.  Patient reported self titration as she was prescribed Cymbalta 120 mg.  She denied suicidal or homicidal ideations.  Denies auditory visual hallucinations.  Patient reported restless nights and  difficulty staying asleep.  Discussed initiating trazodone 50 mg p.o. as needed nightly.  Intense discussion related to taking trazodone and Zanaflex at the same time patient.  Side effects was reviewed up to including death. patient was receptive and provided verbal understanding.  Discussed following up for GeneSight testing.  We will continue to monitor.  Support, encouragement and reassurance was provided.  Visit Diagnosis: No diagnosis found.  Past Psychiatric History:   Past Medical History:  Past Medical History:  Diagnosis Date  . Fibromyalgia   . Migraine   . Thyroid disease     Past Surgical History:  Procedure Laterality Date  . TONSILLECTOMY    . TUBAL LIGATION    . uterine ablation      Family Psychiatric History:   Family History:  Family History  Problem Relation Age of Onset  . Heart disease Mother   . Depression Mother   . Heart disease Father   . Depression Father   . Depression Maternal Uncle     Social History:  Social History   Socioeconomic History  . Marital status: Married    Spouse name: Not on file  . Number of children: Not on file  . Years of education: Not on file  . Highest education level: Not on file  Occupational History  . Not on file  Tobacco Use  . Smoking status: Former Smoker    Types: Cigarettes  . Smokeless tobacco: Never Used  . Tobacco comment: quit feb 2016  Substance and Sexual Activity  . Alcohol use: No    Alcohol/week: 0.0 standard drinks  .  Drug use: No  . Sexual activity: Yes    Birth control/protection: Surgical  Other Topics Concern  . Not on file  Social History Narrative  . Not on file   Social Determinants of Health   Financial Resource Strain:   . Difficulty of Paying Living Expenses: Not on file  Food Insecurity:   . Worried About Charity fundraiser in the Last Year: Not on file  . Ran Out of Food in the Last Year: Not on file  Transportation Needs:   . Lack of Transportation (Medical): Not on  file  . Lack of Transportation (Non-Medical): Not on file  Physical Activity:   . Days of Exercise per Week: Not on file  . Minutes of Exercise per Session: Not on file  Stress:   . Feeling of Stress : Not on file  Social Connections:   . Frequency of Communication with Friends and Family: Not on file  . Frequency of Social Gatherings with Friends and Family: Not on file  . Attends Religious Services: Not on file  . Active Member of Clubs or Organizations: Not on file  . Attends Archivist Meetings: Not on file  . Marital Status: Not on file    Allergies:  Allergies  Allergen Reactions  . Augmentin [Amoxicillin-Pot Clavulanate]     swelling  . Bactrim [Sulfamethoxazole-Trimethoprim]     Lips swelling    Metabolic Disorder Labs: Lab Results  Component Value Date   HGBA1C 5.6 08/16/2019   MPG 114 08/16/2019   No results found for: PROLACTIN Lab Results  Component Value Date   CHOL 217 (H) 08/16/2019   TRIG 89 08/16/2019   HDL 44 (L) 08/16/2019   CHOLHDL 4.9 08/16/2019   LDLCALC 154 (H) 08/16/2019   LDLCALC 137 (H) 04/19/2018   Lab Results  Component Value Date   TSH 1.37 12/03/2019   TSH 0.94 08/16/2019    Therapeutic Level Labs: No results found for: LITHIUM No results found for: VALPROATE No components found for:  CBMZ  Current Medications: Current Outpatient Medications  Medication Sig Dispense Refill  . acetaminophen (TYLENOL) 325 MG tablet Take 2 tablets (650 mg total) by mouth every 6 (six) hours as needed for mild pain, moderate pain or headache. (Patient not taking: Reported on 12/04/2019) 60 tablet 0  . albuterol (VENTOLIN HFA) 108 (90 Base) MCG/ACT inhaler Inhale 2 puffs po QID prn wheezing (Patient not taking: Reported on 12/04/2019) 18 g 0  . ALPRAZolam (XANAX) 0.25 MG tablet Take 0.25 mg by mouth 3 (three) times daily as needed.    . diphenhydrAMINE (BENADRYL) 25 MG tablet Take 1 tablet (25 mg total) by mouth every 6 (six) hours. (Patient  not taking: Reported on 12/04/2019) 20 tablet 0  . DULoxetine (CYMBALTA) 60 MG capsule Take 60 mg by mouth 2 (two) times daily.    . famotidine (PEPCID) 20 MG tablet Take 1 tablet (20 mg total) by mouth 2 (two) times daily. 30 tablet 0  . ondansetron (ZOFRAN) 8 MG tablet Take 1 tablet (8 mg total) by mouth every 8 (eight) hours as needed for nausea. 12 tablet 0  . rosuvastatin (CRESTOR) 5 MG tablet Take 1 tablet (5 mg total) by mouth daily. 30 tablet 3  . SYNTHROID 75 MCG tablet Take 1 tablet (75 mcg total) by mouth daily before breakfast. 30 tablet 2  . tiZANidine (ZANAFLEX) 4 MG tablet Take one half tablet qam, one half tablet in the evening and one whole tablet qhs 60  tablet 5  . traMADol (ULTRAM) 50 MG tablet Take 50-100 mg by mouth every 6 (six) hours as needed.    . traZODone (DESYREL) 50 MG tablet Take 1 tablet (50 mg total) by mouth at bedtime. 30 tablet 0  . triamcinolone cream (KENALOG) 0.1 % Apply 1 application topically 2 (two) times daily. (Patient not taking: Reported on 12/04/2019) 30 g 2  . valACYclovir (VALTREX) 1000 MG tablet Take 1 tablet (1,000 mg total) by mouth 3 (three) times daily. 21 tablet 0  . Vitamin D, Ergocalciferol, (DRISDOL) 1.25 MG (50000 UT) CAPS capsule Take 50,000 Units by mouth once a week.     No current facility-administered medications for this visit.     Musculoskeletal:   Psychiatric Specialty Exam: Review of Systems  Psychiatric/Behavioral: Positive for sleep disturbance.  All other systems reviewed and are negative.   There were no vitals taken for this visit.There is no height or weight on file to calculate BMI.  General Appearance: Casual  Eye Contact:  Fair  Speech:  Clear and Coherent  Volume:  Normal  Mood:  Anxious and Depressed  Affect:  Depressed and Flat  Thought Process:  Coherent  Orientation:  Full (Time, Place, and Person)  Thought Content: Logical   Suicidal Thoughts:  No  Homicidal Thoughts:  No  Memory:  Immediate;    Fair Recent;   Fair  Judgement:  Fair  Insight:  Fair  Psychomotor Activity:  Normal  Concentration:  Concentration: Fair  Recall:  [employer] of Knowledge: Fair  Language: Fair  Akathisia:  No  Handed:  Right  AIMS (if indicated):   Assets:  Communication Skills Desire for Improvement Resilience Social Support  ADL's:  Intact  Cognition: WNL  Sleep:  Fair   Screenings: GAD-7     Office Visit from 12/18/2018 in [facility]  Total GAD-7 Score  9    PHQ2-9     Counselor from 11/29/2019 in [facility]GRAM Office Visit from 12/18/2018 in [facility]  PHQ-2 Total Score  6  3  PHQ-9 Total Score  24  14       Assessment and Plan:  Continue partial hospitalization programming Titration to Cymbalta 60 mg to 30 mg-see note for titration schedule Initiated trazodone 50 mg p.o. nightly Follow-up in office for GeneSight testing  Treatment plan was reviewed and agreed upon by NPT Dayannara Pascal inpatient [place] [facility]'s need for continued group services.   [name], NP 12/06/2019, 10:39 AM

## 2019-12-09 ENCOUNTER — Other Ambulatory Visit: Payer: Self-pay

## 2019-12-09 ENCOUNTER — Other Ambulatory Visit (HOSPITAL_COMMUNITY): Payer: BC Managed Care – PPO

## 2019-12-09 ENCOUNTER — Encounter (HOSPITAL_COMMUNITY): Payer: Self-pay

## 2019-12-10 ENCOUNTER — Other Ambulatory Visit (HOSPITAL_COMMUNITY): Payer: BC Managed Care – PPO | Admitting: Specialist

## 2019-12-10 ENCOUNTER — Encounter (HOSPITAL_COMMUNITY): Payer: Self-pay | Admitting: Specialist

## 2019-12-10 ENCOUNTER — Other Ambulatory Visit: Payer: Self-pay

## 2019-12-10 ENCOUNTER — Other Ambulatory Visit (HOSPITAL_COMMUNITY): Payer: BC Managed Care – PPO | Admitting: Licensed Clinical Social Worker

## 2019-12-10 DIAGNOSIS — F332 Major depressive disorder, recurrent severe without psychotic features: Secondary | ICD-10-CM

## 2019-12-10 DIAGNOSIS — F4323 Adjustment disorder with mixed anxiety and depressed mood: Secondary | ICD-10-CM

## 2019-12-10 NOTE — Therapy (Signed)
[facility] PARTIAL HOSPITALIZATION PROGRAM [address] [place], [place], [address] Phone: [phone]   Fax:  [phone]  Occupational Therapy Treatment  Patient Details  Name: Mary [name] MRN: [id number] Date of Birth: 1966/02/23 Referring Provider (OT): Hillery Jacks    Encounter Date: 12/10/2019  OT End of Session - 12/10/19 1417    Visit Number  3    Number of Visits  8    Date for OT Re-Evaluation  12/20/19    Authorization Type  [employer]    Authorization Time Period  30 visits authorized    Authorization - Visit Number  3    Authorization - Number of Visits  30    OT Start Time  1100    OT Stop Time  1155    OT Time Calculation (min)  55 min    Activity Tolerance  Patient tolerated treatment well    Behavior During Therapy  [facility] for tasks assessed/performed       Past Medical History:  Diagnosis Date  . Fibromyalgia   . Migraine   . Thyroid disease     Past Surgical History:  Procedure Laterality Date  . TONSILLECTOMY    . TUBAL LIGATION    . uterine ablation      There were no vitals filed for this visit.  Subjective Assessment - 12/10/19 1417    Currently in Pain?  No/denies         [facility] OT Assessment - 12/10/19 0001      Assessment   Medical Diagnosis  MDD, Difficulty Coping    Referring Provider (OT)  Hillery Jacks         S:  Im pretty good at time management at work.  At home I tend to spend a lot of time on social media and dont get other things compelted.   O:  Patient participated in skilled OT group focusing on time management this date.  Group consisted of warm up activity relating how you would spend $86,400 if you had it and couldn't have any left over at end of day - as we have 86,400 seconds in a day and need to spend them wisely.  Group discussed if time can be managed vs activities in a day being managed.  Group was educated on effects of good time management ( improved focus, decision making,  success, decreased stress, increased free time) as well as effects of inefficient time management ( inefficiency, increased stress, and poor quality work).  Group discussed common time thieves and possible solutions, and finished with education on tips to improve time management ( know how you currently spend time, set priorities, use a scheduling tool, schedule time appropriately, get organized, delegate, avoid procrastination, manage time wasters, avoid multitasking, stay healthy). Group concluded with members identifying one new time management strategy they plan to implement into daily routine.  A:  [name] able to see that the time management skills she utilizes at work do not follow through to her home life.  Selena Batten tends to waste time on social media sites, which causes her to be unmotivated for the remainder of the day.  [name] committed to using a planner for improved focus and time management at work.     P: Continue skilled OT group intervention with focus of next group on goal setting and priorities.              OT Education - 12/10/19 1417    Education Details  educated  patient on time management strategies    Person(s) Educated  Patient    Methods  Explanation;Handout    Comprehension  Verbalized understanding       OT Short Term Goals - 12/06/19 1513      OT SHORT TERM GOAL #1   Title  Pt will be educated on strategies to improve psychosocial skills needed to participate fully in all daily, work, and leisure activities    Time  3    Period  Weeks    Status  On-going    Target Date  12/20/19      OT SHORT TERM GOAL #2   Title  Pt will apply psychosocial skills and coping mechanisms to daily activities in order to function independently and reintegrate into community    Time  3    Period  Weeks    Status  On-going      OT SHORT TERM GOAL #3   Title  Pt will recall and/or apply 1-3 sleep hygiene strategies to improve function in BADL routine upon reintegrating into community     Time  3    Period  Weeks    Status  On-going      OT SHORT TERM GOAL #4   Title  Pt will engage in goal setting to improve functional BADL/IADL routine upon reintegrating into community    Time  3    Period  Weeks    Status  On-going      OT SHORT TERM GOAL #5   Title  Patient will recall and apply at least 3 strategies to improve her communication skills needed for improved function at work and other social settings.    Time  3    Period  Weeks    Status  On-going               Plan - 12/10/19 1418    Body Structure / Function / Physical Skills  ADL    Cognitive Skills  Attention;Perception;Problem Solve;Emotional;Energy/Drive;Thought    Psychosocial Skills  Coping Strategies;Environmental  Adaptations;Interpersonal Interaction;Routines and Behaviors       Patient will benefit from skilled therapeutic intervention in order to improve the following deficits and impairments:   Body Structure / Function / Physical Skills: ADL Cognitive Skills: Attention, Perception, Problem Solve, Emotional, Energy/Drive, Thought Psychosocial Skills: Coping Strategies, Environmental  Adaptations, Interpersonal Interaction, Routines and Behaviors   Visit Diagnosis: Adjustment disorder with mixed anxiety and depressed mood    Problem List Patient Active Problem List   Diagnosis Date Noted  . Mixed hyperlipidemia 02/14/2019  . Hypothyroidism 09/26/2018  . Goiter 09/26/2018  . Palpitations 09/25/2015  . Adjustment disorder with mixed anxiety and depressed mood 08/03/2014  . Fibromyalgia affecting multiple sites 05/28/2014    Vangie Bicker, [place], OTR/L [phone]  12/10/2019, 2:21 PM  [place], [place], [address] Phone: [phone]   Fax:  [phone]  Name: ELLERIE ARENZ MRN: [id number] Date of Birth: 11-03-1965

## 2019-12-10 NOTE — Progress Notes (Signed)
Spoke with patient via Webex video call, used 2 identifiers to correctly identify patient. She states that groups are going great and she is really enjoying them. Denies SI/HI or AV hallucinations. She will have genetic testing done tomorrow and hopes to get off the cymbalta. No issues or complaints, smiling and laughing with this Clinical research associate when her computer kept echoing our voices. No plans for discharge this week. Depression and anxiety minimal.

## 2019-12-11 ENCOUNTER — Other Ambulatory Visit: Payer: Self-pay

## 2019-12-11 ENCOUNTER — Other Ambulatory Visit (HOSPITAL_COMMUNITY): Payer: BC Managed Care – PPO | Admitting: Licensed Clinical Social Worker

## 2019-12-11 ENCOUNTER — Encounter (HOSPITAL_COMMUNITY): Payer: Self-pay | Admitting: Family

## 2019-12-11 ENCOUNTER — Telehealth (HOSPITAL_COMMUNITY): Payer: Self-pay | Admitting: *Deleted

## 2019-12-11 DIAGNOSIS — F332 Major depressive disorder, recurrent severe without psychotic features: Secondary | ICD-10-CM

## 2019-12-11 NOTE — Psych (Signed)
Virtual Visit via Video Note  I connected with [name] on 12/04/19 at  9:00 AM EST by a video enabled telemedicine application and verified that I am speaking with the correct person using two identifiers.   I discussed the limitations of evaluation and management by telemedicine and the availability of in person appointments. The patient expressed understanding and agreed to proceed.  I discussed the assessment and treatment plan with the patient. The patient was provided an opportunity to ask questions and all were answered. The patient agreed with the plan and demonstrated an understanding of the instructions.   The patient was advised to call back or seek an in-person evaluation if the symptoms worsen or if the condition fails to improve as anticipated.  Pt was provided 240 minutes of non-face-to-face time during this encounter.   Donia Guiles, LCSW    [facility] BH PHP THERAPIST PROGRESS NOTE  DARCIA LAMPI [phone]  Session Time: 9:00 - 10:00  Participation Level: Active  Behavioral Response: CasualAlertDepressed  Type of Therapy: Group Therapy  Treatment Goals addressed: Coping  Interventions: CBT, DBT, Supportive and Reframing  Summary: Clinician led check-in regarding current stressors and situation, and review of patient completed daily inventory. Clinician utilized active listening and empathetic response and validated patient emotions. Clinician facilitated processing group on pertinent issues.   Therapist Response:Titilayo D Azzarello is a 54 y.o. female who presents with depression symptoms. Patient arrived within time allowed and reports that she is feeling "really, really tired." Patient rates hermood at a 7on a scale of 1-10 with 10 being great. Pt reports that yesterday was "very emotional" for her. Pt states she escorted her daughter to court for a hearing for her daughter's charges. Pt shares she hasn't gotten to spend much time with her  daughter so it was good to spend time with her , however draining as well. Pt states she went to bible study in the evening and received support which was helpful. Pt reports struggling with processing her emotions. Pt is able to process. Patient engaged in discussion.       Session Time: 10:00-11:00  Participation Level:Active  Behavioral Response:CasualAlertDepressed  Type of Therapy: Group Therapy, psychoeducation, psychotherapy  Treatment Goals addressed: Coping  Interventions:CBT, DBT, Solution Focused, Supportive and Reframing  Summary:Cln led discussion on how to gain support for ourselves when facing a struggle. Group members discussed various ways they are trying to manage things on their own and how it feels. Cln encouraged pt's to seek support to be "along side" them while they do something that difficult. Group talked about what that could look like and how they can apply it.    Therapist Response: Pt engaged in discussion and identifies feeling anxiety about reading work emails she is recieving during leave. Pt identifies her best friend as someone she could seek support from  and that her best friend could help motivate, encourage, and reframe things for her.        Session Time: 11:00- 12:00  Participation Level:Active  Behavioral Response:CasualAlertDepressed  Type of Therapy: Group Therapy, psychotherapy  Treatment Goals addressed: Coping  Interventions: Strengths based, reframing, Supportive,   Summary:  Spiritual Care group  Therapist Response: Patient engaged in group. See chaplain note.         Session Time: 12:00 -1:00  Participation Level:Active  Behavioral Response:CasualAlertDepressed  Type of Therapy: Group Therapy, Psychoeducation  Treatment Goals addressed: Coping  Interventions: relaxation training; Supportive; Reframing  Summary: 12:00 - 12:50: Relaxation group: Cln led  group focused on retraining  the body's response to stress.   12:50 -1:00 Clinician led check-out. Clinician assessed for immediate needs, medication compliance and efficacy, and safety concerns   Therapist Response: Patient engaged in activity and discussion. 12:50 - 1:00: At check-out, patient rates her mood at a 8 on a scale of 1-10 with 10 being great.Pt states afternoon plans of being "nice to myself." Patient demonstrates someprogress as evidenced by practicing coping skills at home.Patient denies SI/HI/self-harm at the end of group.    Suicidal/Homicidal: Nowithout intent/plan  Plan: Pt will continue in PHP while working to decrease depression symptoms, increase ability to manage symptoms in a healthy manner, and increase emotional regulation.  Diagnosis: Severe episode of recurrent major depressive disorder, without psychotic features ([place]) [F33.2]    1. Severe episode of recurrent major depressive disorder, without psychotic features ([place])       [name], LCSW 12/11/2019

## 2019-12-11 NOTE — Telephone Encounter (Signed)
Pt in today for GeneSight testing. Pickup scheduled for tomorrow. Pt education initiated.

## 2019-12-11 NOTE — Progress Notes (Addendum)
Virtual Visit via Video Note  I connected with Mary [name] on 12/11/19 at  9:00 AM EST by a video enabled telemedicine application and verified that I am speaking with the correct person using two identifiers.   I discussed the limitations of evaluation and management by telemedicine and the availability of in person appointments. The Mary expressed understanding and agreed to proceed   I discussed the assessment and treatment plan with the Mary. The Mary was provided an opportunity to ask questions and all were answered. The Mary agreed with the plan and demonstrated an understanding of the instructions.   The Mary was advised to call back or seek an in-person evaluation if the symptoms worsen or if the condition fails to improve as anticipated.  I provided 15 minutes of non-face-to-face time during this encounter.   [name], NP   BH MD/PA/NP OP Progress Note  12/11/2019 11:07 AM Mary [name]  MRN:  [id number]   Evaluations: Mary [name] was seen and evaluated via teleassessment.  She is awake, alert and oriented x3.  She reports high anxiety related to upcoming job interview and stated her daughter has a court date scheduled soon.  Mary reports dealing with multiple stressors related to family and medical issues.  Mary [name] stated mild withdrawal symptoms related to titration with Cymbalta 60mg s.  Reported mild headaches and some concerns with concentration and mood irritability.  Mary reported extensive medical history as she states her fibromyalgia continues to cause generalized pain.  Mary reports her pain often attributes to her mood.  Rates her depression 4 out of 10 with 10 being the worst.  Overall she reports feeling "okay."  She is denying suicidal or homicidal ideations.  Denies auditory or visual hallucinations.  Reports some sleep disturbance however is working through sleep hygiene techniques.  Mary to follow-up in office for Mary [name]  testing.  Discussed initiating a different antidepressant once Mary completes Cymbalta titration schedule.  Mary was receptive to plan.  Mary reported plans to follow-up with intensive outpatient programming.  Support, encouragement and reassurance was provided.     Visit Diagnosis: No diagnosis found.  Past Psychiatric History:   Past Medical History:  Past Medical History:  Diagnosis Date  . Fibromyalgia   . Migraine   . Thyroid disease     Past Surgical History:  Procedure Laterality Date  . TONSILLECTOMY    . TUBAL LIGATION    . uterine ablation      Family Psychiatric History:   Family History:  Family History  Problem Relation Age of Onset  . Heart disease Mother   . Depression Mother   . Heart disease Father   . Depression Father   . Depression Maternal Uncle     Social History:  Social History   Socioeconomic History  . Marital status: Married    Spouse name: Not on file  . Number of children: Not on file  . Years of education: Not on file  . Highest education level: Not on file  Occupational History  . Not on file  Tobacco Use  . Smoking status: Former Smoker    Types: Cigarettes  . Smokeless tobacco: Never Used  . Tobacco comment: quit feb 2016  Substance and Sexual Activity  . Alcohol use: No    Alcohol/week: 0.0 standard drinks  . Drug use: No  . Sexual activity: Yes    Birth control/protection: Surgical  Other Topics Concern  . Not on file  Social History Narrative  .  Not on file   Social Determinants of Health   Financial Resource Strain:   . Difficulty of Paying Living Expenses: Not on file  Food Insecurity:   . Worried About Charity fundraiser in the Last Year: Not on file  . Ran Out of Food in the Last Year: Not on file  Transportation Needs:   . Lack of Transportation (Medical): Not on file  . Lack of Transportation (Non-Medical): Not on file  Physical Activity:   . Days of Exercise per Week: Not on file  . Minutes  of Exercise per Session: Not on file  Stress:   . Feeling of Stress : Not on file  Social Connections:   . Frequency of Communication with Friends and Family: Not on file  . Frequency of Social Gatherings with Friends and Family: Not on file  . Attends Religious Services: Not on file  . Active Member of Clubs or Organizations: Not on file  . Attends Archivist Meetings: Not on file  . Marital Status: Not on file    Allergies:  Allergies  Allergen Reactions  . Augmentin [Amoxicillin-Pot Clavulanate]     swelling  . Bactrim [Sulfamethoxazole-Trimethoprim]     Lips swelling    Metabolic Disorder Labs: Lab Results  Component Value Date   HGBA1C 5.6 08/16/2019   MPG 114 08/16/2019   No results found for: PROLACTIN Lab Results  Component Value Date   CHOL 217 (H) 08/16/2019   TRIG 89 08/16/2019   HDL 44 (L) 08/16/2019   CHOLHDL 4.9 08/16/2019   LDLCALC 154 (H) 08/16/2019   LDLCALC 137 (H) 04/19/2018   Lab Results  Component Value Date   TSH 1.37 12/03/2019   TSH 0.94 08/16/2019    Therapeutic Level Labs: No results found for: LITHIUM No results found for: VALPROATE No components found for:  CBMZ  Current Medications: Current Outpatient Medications  Medication Sig Dispense Refill  . ALPRAZolam (XANAX) 0.25 MG tablet Take 0.25 mg by mouth 3 (three) times daily as needed.    . DULoxetine (CYMBALTA) 60 MG capsule Take 60 mg by mouth 2 (two) times daily.    . famotidine (PEPCID) 20 MG tablet Take 1 tablet (20 mg total) by mouth 2 (two) times daily. 30 tablet 0  . rosuvastatin (CRESTOR) 5 MG tablet Take 1 tablet (5 mg total) by mouth daily. 30 tablet 3  . SYNTHROID 75 MCG tablet Take 1 tablet (75 mcg total) by mouth daily before breakfast. 30 tablet 2  . tiZANidine (ZANAFLEX) 4 MG tablet Take one half tablet qam, one half tablet in the evening and one whole tablet qhs 60 tablet 5  . traMADol (ULTRAM) 50 MG tablet Take 50-100 mg by mouth every 6 (six) hours as  needed.    . traZODone (DESYREL) 50 MG tablet Take 1 tablet (50 mg total) by mouth at bedtime. 30 tablet 0  . valACYclovir (VALTREX) 1000 MG tablet Take 1 tablet (1,000 mg total) by mouth 3 (three) times daily. 21 tablet 0  . Vitamin D, Ergocalciferol, (DRISDOL) 1.25 MG (50000 UT) CAPS capsule Take 50,000 Units by mouth once a week.    Marland Kitchen acetaminophen (TYLENOL) 325 MG tablet Take 2 tablets (650 mg total) by mouth every 6 (six) hours as needed for mild pain, moderate pain or headache. (Mary not taking: Reported on 12/04/2019) 60 tablet 0  . albuterol (VENTOLIN HFA) 108 (90 Base) MCG/ACT inhaler Inhale 2 puffs po QID prn wheezing (Mary not taking: Reported on 12/04/2019)  18 g 0  . diphenhydrAMINE (BENADRYL) 25 MG tablet Take 1 tablet (25 mg total) by mouth every 6 (six) hours. (Mary not taking: Reported on 12/04/2019) 20 tablet 0  . ondansetron (ZOFRAN) 8 MG tablet Take 1 tablet (8 mg total) by mouth every 8 (eight) hours as needed for nausea. 12 tablet 0  . triamcinolone cream (KENALOG) 0.1 % Apply 1 application topically 2 (two) times daily. (Mary not taking: Reported on 12/04/2019) 30 g 2   No current facility-administered medications for this visit.     Musculoskeletal:   Psychiatric Specialty Exam: Review of Systems  There were no vitals taken for this visit.There is no height or weight on file to calculate BMI.  General Appearance: Casual  Eye Contact:  Good  Speech:  Clear and Coherent  Volume:  Normal  Mood:  Anxious and Depressed  Affect:  Congruent  Thought Process:  Coherent  Orientation:  Full (Time, Place, and Person)  Thought Content: Logical   Suicidal Thoughts:  No  Homicidal Thoughts:  No  Memory:  Immediate;   Fair Recent;   Fair  Judgement:  Fair  Insight:  Fair  Psychomotor Activity:  Normal  Concentration:  Concentration: Fair  Recall:  [employer] of Knowledge: Fair  Language: Fair  Akathisia:  No  Handed:  Right  AIMS (if indicated):   Assets:   Communication Skills Desire for Improvement Resilience Social Support  ADL's:  Intact  Cognition: WNL  Sleep:  Fair   Screenings: GAD-7     Office Visit from 12/18/2018 in [facility]  Total GAD-7 Score  9    PHQ2-9     Counselor from 11/29/2019 in [facility] Office Visit from 12/18/2018 in [facility]  PHQ-2 Total Score  6  3  PHQ-9 Total Score  24  14       Assessment and Plan:  Continue partial hospitalization programming Continue Cymbalta 30 mg p.o. daily x2 weeks Keep follow-up appointment with Mary [name] testing  Treatment plan was reviewed and agreed upon by NPT Mary [name] and Mary [name] need for continued group services    [name], NP 12/11/2019, 11:07 AM

## 2019-12-12 ENCOUNTER — Other Ambulatory Visit (HOSPITAL_COMMUNITY): Payer: BC Managed Care – PPO

## 2019-12-12 ENCOUNTER — Other Ambulatory Visit (HOSPITAL_COMMUNITY): Payer: BC Managed Care – PPO | Admitting: Licensed Clinical Social Worker

## 2019-12-12 ENCOUNTER — Other Ambulatory Visit: Payer: Self-pay

## 2019-12-12 DIAGNOSIS — F332 Major depressive disorder, recurrent severe without psychotic features: Secondary | ICD-10-CM

## 2019-12-13 ENCOUNTER — Telehealth (HOSPITAL_COMMUNITY): Payer: Self-pay | Admitting: Professional

## 2019-12-13 ENCOUNTER — Other Ambulatory Visit: Payer: Self-pay

## 2019-12-13 ENCOUNTER — Encounter (HOSPITAL_COMMUNITY): Payer: Self-pay

## 2019-12-13 ENCOUNTER — Other Ambulatory Visit (HOSPITAL_COMMUNITY): Payer: BC Managed Care – PPO | Admitting: Licensed Clinical Social Worker

## 2019-12-13 ENCOUNTER — Other Ambulatory Visit (HOSPITAL_COMMUNITY): Payer: BC Managed Care – PPO | Admitting: Specialist

## 2019-12-13 DIAGNOSIS — F4323 Adjustment disorder with mixed anxiety and depressed mood: Secondary | ICD-10-CM

## 2019-12-13 DIAGNOSIS — F332 Major depressive disorder, recurrent severe without psychotic features: Secondary | ICD-10-CM | POA: Diagnosis not present

## 2019-12-13 DIAGNOSIS — R4589 Other symptoms and signs involving emotional state: Secondary | ICD-10-CM

## 2019-12-13 NOTE — Therapy (Addendum)
Virtual Visit via Video Note  I connected with [name] on 12/13/19 at  11:00 AM EST by a video enabled telemedicine application and verified that I am speaking with the correct person using two identifiers.   I discussed the limitations of evaluation and management by telemedicine and the availability of in person appointments. The patient expressed understanding and agreed to proceed.   I discussed the assessment and treatment plan with the patient. The patient was provided an opportunity to ask questions and all were answered. The patient agreed with the plan and demonstrated an understanding of the instructions.   The patient was advised to call back or seek an in-person evaluation if the symptoms worsen or if the condition fails to improve as anticipated.  I provided 60 minutes of non-face-to-face time during this encounter.   Shirlean Mylar, MHA, OTR/L [phone]   [facility] HOSPITALIZATION PROGRAM [address] [place], [place], [address] Phone: [phone]   Fax:  [phone]  Occupational Therapy Treatment  Patient Details  Name: Mary [name] MRN: [id number] Date of Birth: Jan 18, 1966 Referring Provider (OT): Hillery Jacks    Encounter Date: 12/13/2019  OT End of Session - 12/13/19 1323    Visit Number  4    Number of Visits  8    Date for OT Re-Evaluation  12/20/19    Authorization Type  [employer]    Authorization Time Period  30 visits authorized    Authorization - Visit Number  4    Authorization - Number of Visits  30    OT Start Time  1100    OT Stop Time  1150    OT Time Calculation (min)  50 min    Activity Tolerance  Patient tolerated treatment well    Behavior During Therapy  [facility] for tasks assessed/performed       Past Medical History:  Diagnosis Date  . Fibromyalgia   . Migraine   . Thyroid disease     Past Surgical History:  Procedure Laterality Date  . TONSILLECTOMY    . TUBAL LIGATION     . uterine ablation      There were no vitals filed for this visit.  Subjective Assessment - 12/13/19 1323    Currently in Pain?  No/denies       S:  I dont have young kids anymore so I dont really need to be accountable.    O:  Group opened with members defining accountability.  Leader then explained definition of personal accountability, each individual being responsible for their own actions and holding themselves to this expected standard.  Personal accountability is doing what you know you should do, when you should do it.  Group discussed if accountability was a positive or negative theme for them and why they had this view point.  Group was educated on three type of accountability:  actions and choices, responsibilities, and goals.  Group provided examples of each accountability and identified the type that they most struggle with.  Leader identified the use of a daily prioritized to do list as a successful tool in improving accountability.  Group members discussed ways to prioritize the list. Group was educated on 3 barriers to achieving accountability including shame, blame, and justification and group worked on identifying examples of each.  OT encouraged members to play "shame/blame/justification" game at home, placing a predetermined amount of money in a jar each time they engage in these accountability barriers.  Group viewed  a TED talk on accountability and explored consequences as a result of both action and inaction, as well as their beliefs in the potential for ourselves being the cause of some distress in our lives.   Group then explored the theme that one must desire accountability in order to follow through and be successful.  One's values must be considered when making accountability goals.  Each group member spent time reflecting on their work, relationship, Building surveyor, and leisure values.  They used a bull's eye to conceptualilze where they were at "today" in relation  to their values.  Using this information, they will then work on creating goals that align with their values and will improve their accountability.  Group was educated on SMART (specific, meaningful, adaptive, realistic, and time bound goals) as well as how to create a long term goals and associated medium, short, and immediate goals.  Group concluded with members sharing one goal and group determining if SMART framework was adhered to.   A:  [name] stated she liked the TED talk as it encouraged her to not give up.  [name] identified personal relationships as an area that she would like to work on goal setting.  [name] left session early and did not share her goal with the group. P:  Continue skilled OT group 2 times per week for 3 weeks in order to improve coping and psychosocial skilled needed for improved community reintegration, next session to focus on circle of control as it relates to accountability, goal setting, and interpersonal relationships.                     OT Education - 12/13/19 1323    Education Details  educated patient on tips for improving personal accountability, goal setting and SMART goal framework    Person(s) Educated  Patient    Methods  Explanation;Handout    Comprehension  Verbalized understanding       OT Short Term Goals - 12/06/19 1513      OT SHORT TERM GOAL #1   Title  Pt will be educated on strategies to improve psychosocial skills needed to participate fully in all daily, work, and leisure activities    Time  3    Period  Weeks    Status  On-going    Target Date  12/20/19      OT SHORT TERM GOAL #2   Title  Pt will apply psychosocial skills and coping mechanisms to daily activities in order to function independently and reintegrate into community    Time  3    Period  Weeks    Status  On-going      OT SHORT TERM GOAL #3   Title  Pt will recall and/or apply 1-3 sleep hygiene strategies to improve function in BADL routine upon reintegrating into  community    Time  3    Period  Weeks    Status  On-going      OT SHORT TERM GOAL #4   Title  Pt will engage in goal setting to improve functional BADL/IADL routine upon reintegrating into community    Time  3    Period  Weeks    Status  On-going      OT SHORT TERM GOAL #5   Title  Patient will recall and apply at least 3 strategies to improve her communication skills needed for improved function at work and other social settings.    Time  3    Period  Weeks    Status  On-going               Plan - 12/13/19 1324    Body Structure / Function / Physical Skills  ADL    Cognitive Skills  Attention;Perception;Problem Solve;Emotional;Energy/Drive;Thought    Psychosocial Skills  Coping Strategies;Environmental  Adaptations;Interpersonal Interaction;Routines and Behaviors       Patient will benefit from skilled therapeutic intervention in order to improve the following deficits and impairments:   Body Structure / Function / Physical Skills: ADL Cognitive Skills: Attention, Perception, Problem Solve, Emotional, Energy/Drive, Thought Psychosocial Skills: Coping Strategies, Environmental  Adaptations, Interpersonal Interaction, Routines and Behaviors   Visit Diagnosis: Adjustment disorder with mixed anxiety and depressed mood  MDD (major depressive disorder), recurrent severe, without psychosis (HCC)  Difficulty coping    Problem List Patient Active Problem List   Diagnosis Date Noted  . Mixed hyperlipidemia 02/14/2019  . Hypothyroidism 09/26/2018  . Goiter 09/26/2018  . Palpitations 09/25/2015  . Adjustment disorder with mixed anxiety and depressed mood 08/03/2014  . Fibromyalgia affecting multiple sites 05/28/2014    Shirlean Mylar, MHA, OTR/L [phone]  12/13/2019, 1:24 PM  [facility] HOSPITALIZATION PROGRAM [address] [place], [place], [address] Phone: [phone]   Fax:  [phone]  Name: ANALAYAH BROOKE MRN: [id number] Date of Birth: [birth year]

## 2019-12-16 ENCOUNTER — Other Ambulatory Visit (HOSPITAL_COMMUNITY): Payer: BC Managed Care – PPO | Admitting: Licensed Clinical Social Worker

## 2019-12-16 ENCOUNTER — Other Ambulatory Visit: Payer: Self-pay

## 2019-12-16 DIAGNOSIS — F332 Major depressive disorder, recurrent severe without psychotic features: Secondary | ICD-10-CM | POA: Diagnosis not present

## 2019-12-16 NOTE — Psych (Signed)
Virtual Visit via Video Note  I connected with Mary [name] on 12/05/19 at  9:00 AM EST by a video enabled telemedicine application and verified that I am speaking with the correct person using two identifiers.   I discussed the limitations of evaluation and management by telemedicine and the availability of in person appointments. The patient expressed understanding and agreed to proceed.  I discussed the assessment and treatment plan with the patient. The patient was provided an opportunity to ask questions and all were answered. The patient agreed with the plan and demonstrated an understanding of the instructions.   The patient was advised to call back or seek an in-person evaluation if the symptoms worsen or if the condition fails to improve as anticipated.  Pt was provided 240 minutes of non-face-to-face time during this encounter.   Mary Guiles, LCSW    [facility] BH PHP THERAPIST PROGRESS NOTE  Mary [name] [id number]  Session Time: 9:00 - 10:00  Participation Level: Active  Behavioral Response: CasualAlertDepressed  Type of Therapy: Group Therapy  Treatment Goals addressed: Coping  Interventions: CBT, DBT, Supportive and Reframing  Summary: Clinician led check-in regarding current stressors and situation, and review of patient completed daily inventory. Clinician utilized active listening and empathetic response and validated patient emotions. Clinician facilitated processing group on pertinent issues.   Therapist Response:Mary [name] is a 54 y.o. female who presents with depression symptoms. Patient arrived within time allowed and reports that she is feeling "good." Patient rates hermood at a 8.5on a scale of 1-10 with 10 being great. Pt reports she is excited about an interview that went well yesterday. Pt states she is optimistic and feels good about the opportunity. Pt shares she had a positive interaction with her oldest daughter and that was  encouraging as it is rare. Pt reports struggling with managing work anxiety. Pt is able to process. Patient engaged in discussion.       Session Time: 10:00-11:00  Participation Level:Active  Behavioral Response:CasualAlertDepressed  Type of Therapy: Group Therapy, psychoeducation, psychotherapy  Treatment Goals addressed: Coping  Interventions:CBT, DBT, Solution Focused, Supportive and Reframing  Summary:Cln led discussion on personalization. Cln discussed personalization as a distorted lens of viewing other people's actions. Cln normalized and validated pt's experiences with personalizing and how it affects them. Cln encouraged pt's to challenge themselves with the question: "is there any other way I could view this situation?"  Therapist Response:  Pt engaged in discussion and reports personalization is an issue for her in most settings. Pt states she can be rude or passive-agressive with people when she percieves a slight. Pt accepts feedback and reframing from cln and group members. Pt responds well to challenge question and states she will try this.         Session Time: 11:00- 12:00  Participation Level:Active  Behavioral Response:CasualAlertDepressed  Type of Therapy: Group Therapy, psychoeducation, psychotherapy  Treatment Goals addressed: Coping  Interventions:CBT, DBT, Solution Focused, Supportive and Reframing  Summary:Cln continued topic of distress tolerance skills. Group reviewed skills discussed yesterday and if they had practiced. Cln introduced E-P-T-S of ACCEPTS skills and group members discussed how they can practice them.  Therapist Response: Pt participated in discussion and reports understanding of skills discussed. Pt reports she is most likely to utilize pushing away.      Session Time: 12:00 -1:00  Participation Level:Active  Behavioral Response:CasualAlertDepressed  Type of Therapy: Group Therapy,  Psychoeducation; Psychotherapy  Treatment Goals addressed: Coping  Interventions:CBT; Solution focused; Supportive; Reframing  Summary:12:00 - 12:50:Cln continued topic of distress tolerance skills. Cln introduced grounding techniques as supplement to "T" thought based strategies of ACCEPTS. Cln utilized handout "Grounding Techniques" and reviewed [phone], body scan, mental exercises and categories. Group practiced skills together and discussed ways they could apply them.  12:50 -1:00 Clinician led check-out. Clinician assessed for immediate needs, medication compliance and efficacy, and safety concerns  Therapist Response:12:00 - 12:50:Pt participated in discussion and activity. Pt identifies [phone] as the strategy she will utilize the most.  12:50 - 1:00: At check-out, patient rates her mood at a 8.5 on a scale of 1-10 with 10 being great.Pt states afternoon plans of taking a shower and doing chores. Patient demonstrates someprogress as evidenced by increased recognition of emotional reactivity issues.Patient denies SI/HI/self-harm at the end of group.    Suicidal/Homicidal: Nowithout intent/plan  Plan: Pt will continue in PHP while working to decrease depression symptoms, increase ability to manage symptoms in a healthy manner, and increase emotional regulation.  Diagnosis: Severe episode of recurrent major depressive disorder, without psychotic features ([place]) [F33.2]    1. Severe episode of recurrent major depressive disorder, without psychotic features ([place])       [name], LCSW 12/16/2019

## 2019-12-16 NOTE — Progress Notes (Signed)
Spoke with patient via Webex video call, used 2 identifiers to correctly identify patient. Groups have been going well, she has learned good problem solving skills. Excited to get her test results back from the genetic testing she had done last week. Weaning off Cymbalta, now taking 30mg  daily for 1 week and then discontinuing. She is having vertigo and feels some confusion but didn't notice it until recently. She is unsure if it's the Trazodone or Cymbalta. Continues to not sleep good even with Trazodone and has weird dreams. Email sent to NP to make her aware of possible side effects. Having anxiety about a job situation and going through the "what if's." She is being discharged to IOP after this week. Denies SI/HI or AV hallucinations. On scale 1-10 as 10 being worst she rates depression at 4 and anxiety at 6. PHQ9=16. No other issues or complaints.

## 2019-12-17 ENCOUNTER — Other Ambulatory Visit (HOSPITAL_COMMUNITY): Payer: BC Managed Care – PPO | Admitting: Specialist

## 2019-12-17 ENCOUNTER — Encounter (HOSPITAL_COMMUNITY): Payer: Self-pay | Admitting: Family

## 2019-12-17 ENCOUNTER — Other Ambulatory Visit: Payer: Self-pay

## 2019-12-17 ENCOUNTER — Encounter (HOSPITAL_COMMUNITY): Payer: Self-pay

## 2019-12-17 ENCOUNTER — Telehealth (HOSPITAL_COMMUNITY): Payer: Self-pay | Admitting: Psychiatry

## 2019-12-17 ENCOUNTER — Other Ambulatory Visit (HOSPITAL_COMMUNITY): Payer: BC Managed Care – PPO | Admitting: Licensed Clinical Social Worker

## 2019-12-17 DIAGNOSIS — F332 Major depressive disorder, recurrent severe without psychotic features: Secondary | ICD-10-CM

## 2019-12-17 DIAGNOSIS — F4323 Adjustment disorder with mixed anxiety and depressed mood: Secondary | ICD-10-CM

## 2019-12-17 DIAGNOSIS — R41844 Frontal lobe and executive function deficit: Secondary | ICD-10-CM

## 2019-12-17 NOTE — Psych (Signed)
Virtual Visit via Video Note  I connected with Mary [name] on 12/06/19 at  9:00 AM EST by a video enabled telemedicine application and verified that I am speaking with the correct person using two identifiers.   I discussed the limitations of evaluation and management by telemedicine and the availability of in person appointments. The patient expressed understanding and agreed to proceed.  I discussed the assessment and treatment plan with the patient. The patient was provided an opportunity to ask questions and all were answered. The patient agreed with the plan and demonstrated an understanding of the instructions.   The patient was advised to call back or seek an in-person evaluation if the symptoms worsen or if the condition fails to improve as anticipated.  Pt was provided 240 minutes of non-face-to-face time during this encounter.   [name], LCSW    [facility] PHP THERAPIST PROGRESS NOTE  MONTRICE MONTUORI [id number]  Session Time: 9:00 - 10:00  Participation Level: Active  Behavioral Response: CasualAlertDepressed  Type of Therapy: Group Therapy  Treatment Goals addressed: Coping  Interventions: CBT, DBT, Supportive and Reframing  Summary: Clinician led check-in regarding current stressors and situation, and review of patient completed daily inventory. Clinician utilized active listening and empathetic response and validated patient emotions. Clinician facilitated processing group on pertinent issues.   Therapist Response:Kaliyan D Moffit is a 54 y.o. female who presents with depression symptoms. Patient arrived within time allowed and reports that she is feeling "irritated." Patient rates hermood at a 6on a scale of 1-10 with 10 being great. Pt reports she had a "really good day" until later in the evening. Pt reports a man who used to associate with her middle daughter called her repeatedly and when she answered what he said upset her. Pt is able to  identify feelings when cued and states she struggled to manage her anger and racing thoughts. Pt states she is trying to not let these events carry over today however is struggling. Pt is able to process. Patient engaged in discussion.       Session Time: 10:00-11:00  Participation Level:Active  Behavioral Response:CasualAlertDepressed  Type of Therapy: Group Therapy, psychoeducation, psychotherapy  Treatment Goals addressed: Coping  Interventions:CBT, DBT, Solution Focused, Supportive and Reframing  Summary:Cln introduced theory of the 5 love languages. Group discussed how understanding the way people in their lives show and recieve love could aid the health of relationships and increase getting esteem needs met. Group discussed the ways their love langauge may not  line up with people in their lives' and how applying the love languages could alter their perception.    Therapist Response:  Pt engaged in discussion and is able to identify ways in which love languages could be creating misunderstandings within her relationships. Pt shares she and her family members have different love languages and that may be increasing misunderstandings and feelings she has of not fitting in.        Session Time: 11:00- 12:00  Participation Level:Active  Behavioral Response:CasualAlertDepressed  Type of Therapy: Group Therapy, OT  Treatment Goals addressed: Coping  Interventions:Psychosocial skills training, Supportive,   Summary:Occupational Therapy group  Therapist Response:Patient engaged in group. See OT note.       Session Time: 12:00 -1:00  Participation Level:Active  Behavioral Response:CasualAlertDepressed  Type of Therapy: Group Therapy, Psychoeducation; Psychotherapy  Treatment Goals addressed: Coping  Interventions:CBT; Solution focused; Supportive; Reframing  Summary:12:00 - 12:50:Cln continued topic of distress  tolerance skills. Cln introduced Self-Soothe skill. Group  discussed how they can apply self-soothe skills to provide comfort when they are emotionally escalated. Cln encouraged pt's to remember the "self" component and to not rely on strategies that require other people.  12:50 -1:00 Clinician led check-out. Clinician assessed for immediate needs, medication compliance and efficacy, and safety concerns  Therapist Response:12:00 - 12:50:Pt engaged in discussion and reports understanding of the skill. Pt identifies coffee, cozy blankets, and ocean sounds as ways she can practice self-soothing.  12:50 - 1:00: At check-out, patient rates her mood at a 8 on a scale of 1-10 with 10 being great.Pt states afternoon plans of running errands. Patient demonstrates someprogress as evidenced by practicing coping skills when escalated at home.Patient denies SI/HI/self-harm at the end of group.    Suicidal/Homicidal: Nowithout intent/plan  Plan: Pt will continue in PHP while working to decrease depression symptoms, increase ability to manage symptoms in a healthy manner, and increase emotional regulation.  Diagnosis: MDD (major depressive disorder), recurrent severe, without psychosis ([place]) [F33.2]    1. MDD (major depressive disorder), recurrent severe, without psychosis ([place])   2. Difficulty coping       [name], LCSW 12/17/2019

## 2019-12-17 NOTE — Therapy (Addendum)
Virtual Visit via Video Note  I connected with Mary [name] on 12/17/19 at  11:00 AM EST by a video enabled telemedicine application and verified that I am speaking with the correct person using two identifiers.   I discussed the limitations of evaluation and management by telemedicine and the availability of in person appointments. The patient expressed understanding and agreed to proceed.  I discussed the assessment and treatment plan with the patient. The patient was provided an opportunity to ask questions and all were answered. The patient agreed with the plan and demonstrated an understanding of the instructions.   The patient was advised to call back or seek an in-person evaluation if the symptoms worsen or if the condition fails to improve as anticipated.  I provided 60 minutes of non-face-to-face time during this encounter.     [address] [place], [place], [address] Phone: [phone]   Fax:  [phone]  Occupational Therapy Treatment  Patient Details  Name: Mary [name] MRN: [id number] Date of Birth: [birth year] Referring Provider (OT): Ricky Ala    Encounter Date: 12/17/2019  OT End of Session - 12/17/19 1326    Visit Number  5    Number of Visits  8    Date for OT Re-Evaluation  12/20/19    Authorization Type  [employer]    Authorization Time Period  30 visits authorized    Authorization - Visit Number  5    Authorization - Number of Visits  30    OT Start Time  1100    OT Stop Time  1150    OT Time Calculation (min)  50 min    Activity Tolerance  Patient tolerated treatment well    Behavior During Therapy  [facility] for tasks assessed/performed       Past Medical History:  Diagnosis Date  . Fibromyalgia   . Migraine   . Thyroid disease     Past Surgical History:  Procedure Laterality Date  . TONSILLECTOMY    . TUBAL LIGATION    . uterine ablation      There were no  vitals filed for this visit.  Subjective Assessment - 12/17/19 1326    Currently in Pain?  No/denies       S:  I worry about how others treat me and my past mistakes.  I am starting a new job and I dont want this to affect me.  O:  Group focus today was on realizing our circle of control in life in relation to worrying.  Group opened with members discussing why we worry and types of healthy and unhealthy worrying.  Healthy worrying is normal, can be caused by day to day issues, and is healthy when you identify if it something you can control and take action on the issue.  Unhealthy worrying occurs when it is centered around something outside of your control or if it is within your control and you do not take action on the item.  Patient was able to identify examples of items that would fall within circle of control and outside of their circle of control.  Group then delved deeper into those items that were out of their control, and how they could reframe their thoughts, actions, etc to be in better control of the situation.  Group spent time exploring the following beliefs:  I cant control anyone else, but I can control my thoughts, words, choices, actions reactions, future.  I  cant control automatic thoughts, but I can control if they stay by recognizing them, disagreeing with them, disproving them, letting them go, and thinking positively.  I can't control my past mistakes but I can control my future by admitting mistakes, apologizing, forgiving others, trying again, and starting over.  Patient then worked to determine what has been a worry for them recently and if it was inside or outside of their circle of control.  Group closed with education on the four agreements to live by:  be impeccable with your word, don't take things personally, don't make assumptions, and always do your best.  Keeping these four agreements in mind can improve your freedom limiting beliefs and worry. A:  [name] perseverated on past  work environment this date.  [name] is starting a new job and therapist offered strategies around her circle of control as well as open communication as she prepares for her new job.   P: Continue with skilled OT group intervention 2 times per week.  Next session to focus on communication skills.                       OT Education - 12/17/19 1326    Education Details  educated on circle of control, 50 things you can control today, and the four agreements in relation to those things you can and can't control.    Person(s) Educated  Patient    Methods  Explanation;Handout    Comprehension  Verbalized understanding       OT Short Term Goals - 12/17/19 1328      OT SHORT TERM GOAL #1   Title  Pt will be educated on strategies to improve psychosocial skills needed to participate fully in all daily, work, and leisure activities    Time  3    Period  Weeks    Status  On-going    Target Date  12/20/19      OT SHORT TERM GOAL #2   Title  Pt will apply psychosocial skills and coping mechanisms to daily activities in order to function independently and reintegrate into community    Time  3    Period  Weeks    Status  On-going      OT SHORT TERM GOAL #3   Title  Pt will recall and/or apply 1-3 sleep hygiene strategies to improve function in BADL routine upon reintegrating into community    Time  3    Period  Weeks    Status  On-going      OT SHORT TERM GOAL #4   Title  Pt will engage in goal setting to improve functional BADL/IADL routine upon reintegrating into community    Time  3    Period  Weeks    Status  On-going      OT SHORT TERM GOAL #5   Title  Patient will recall and apply at least 3 strategies to improve her communication skills needed for improved function at work and other social settings.    Time  3    Period  Weeks    Status  On-going               Plan - 12/17/19 1328    Body Structure / Function / Physical Skills  ADL    Cognitive Skills   Attention;Perception;Problem Solve;Emotional;Energy/Drive;Thought    Psychosocial Skills  Coping Strategies;Environmental  Adaptations;Interpersonal Interaction;Routines and Behaviors       Patient will benefit from skilled  therapeutic intervention in order to improve the following deficits and impairments:   Body Structure / Function / Physical Skills: ADL Cognitive Skills: Attention, Perception, Problem Solve, Emotional, Energy/Drive, Thought Psychosocial Skills: Coping Strategies, Environmental  Adaptations, Interpersonal Interaction, Routines and Behaviors   Visit Diagnosis: MDD (major depressive disorder), recurrent severe, without psychosis ([place])  Adjustment disorder with mixed anxiety and depressed mood  Frontal lobe and executive function deficit    Problem List Patient Active Problem List   Diagnosis Date Noted  . Mixed hyperlipidemia 02/14/2019  . Hypothyroidism 09/26/2018  . Goiter 09/26/2018  . Palpitations 09/25/2015  . Adjustment disorder with mixed anxiety and depressed mood 08/03/2014  . Fibromyalgia affecting multiple sites 05/28/2014    Vangie Bicker, MHA, OTR/L [id number] OCCUPATIONAL THERAPY DISCHARGE SUMMARY  Visits from Start of Care: 5  Current functional level related to goals / functional outcomes: Patient has met OT goals    Remaining deficits: N/a ready to use strategies learned in OT to apply to community reintegration.   Education / Equipment: See above.  Plan: Patient agrees to discharge.  Patient goals were partially met. Patient is being discharged due to meeting the stated rehab goals.  ?????    Vangie Bicker, [place], OTR/L [phone]  12/17/2019, 1:28 PM  [place], [place], [address] Phone: [phone]   Fax:  [phone]  Name: TINSLEIGH SLOVACEK MRN: [id number] Date of Birth: July 23, 1966

## 2019-12-17 NOTE — Progress Notes (Signed)
  Virtual Visit via Video Note  I connected with Mary [name] on 12/17/19 at  9:00 AM EST by a video enabled telemedicine application and verified that I am speaking with the correct person using two identifiers.   I discussed the limitations of evaluation and management by telemedicine and the availability of in person appointments. The patient expressed understanding and agreed to proceed.   I discussed the assessment and treatment plan with the patient. The patient was provided an opportunity to ask questions and all were answered. The patient agreed with the plan and demonstrated an understanding of the instructions.   The patient was advised to call back or seek an in-person evaluation if the symptoms worsen or if the condition fails to improve as anticipated.  I provided 15 minutes of non-face-to-face time during this encounter.   [name], NP   [facility] Partial Hospitalization Outpatient Program Discharge Summary  MARCELLINE TEMKIN [id number]  Admission date: 12/02/2019 Discharge date: 12/18/2019  Reason for admission: Per admission assessment note: Mary [name] is a 54 y.o. Caucasian female presents with ongoing depression.  Cynai reports she was referred by her primary care provider due to multiple medication changes.  States she does not feel as if her depression or anxiety has improved.  Keighley reports her primary care provider prescribed 120 mg of Cymbalta however she started feeling head tightness and "zaps" throughout her head due to high dose of Cymbalta.  Stated that she self titrated herself to 60 mg daily and feels little better with that dosing however continues to feel depressed.  Reported medical history with fibromyalgia and thyroid disorder.  Patient reports symptoms of chronic fatigue, depression, anxiety, mood irritability and poor relationship with others.  Denied previous suicide attempts.  Reports a poor appetite.  Reports  interrupted unrestful sleep.  Patient was enrolled in partial psychiatric program on 12/02/19   Progress in Program Toward Treatment Goals: Ongoing, patient attended and participated with daily group session with active and engaged participation.  Currently denying suicidal or homicidal ideation.  Denies auditory or visual hallucinations.  During partial hospitalization programming patient has been titrated off of Cymbalta 120 mg and is currently taking Cymbalta 30 mg.  (1 more week) patient continues to endorse dizziness, confusion and worsening anxiety. Patient reported "more energy" since the adjustment with her synthroid.  Will consider initiating another antidepressant pending GeneSight testing per patient's request.  Discussed consideration follow-up with neurology due to reported above symptoms. Chart reviewed patient has diagnosis with frontal lobe executive function deficit.  patient was receptive to plan.  Progress (rationale):  Mintie consider stepping down to Intensive Outpatient programming (IOP) on 12/18/2019 and/or follow-up with individual therapy and psychiatrist.   Take all medications as prescribed. Keep all follow-up appointments as scheduled.  Do not consume alcohol or use illegal drugs while on prescription medications. Report any adverse effects from your medications to your primary care provider promptly.  In the event of recurrent symptoms or worsening symptoms, call 911, a crisis hotline, or go to the nearest emergency department for evaluation.   [name], NP 12/17/2019
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
## 2019-12-17 NOTE — Telephone Encounter (Signed)
D:  Placed call to orient pt to MH-IOP, but there was no answer and mailbox was full.  Pt will be transitioning to MH-IOP from PHP on 12-19-19. A:  Will attempt to call pt again before 12-19-19.

## 2019-12-18 ENCOUNTER — Other Ambulatory Visit (HOSPITAL_COMMUNITY): Payer: BC Managed Care – PPO

## 2019-12-18 ENCOUNTER — Other Ambulatory Visit: Payer: Self-pay

## 2019-12-18 NOTE — Psych (Signed)
Virtual Visit via Video Note  I connected with Mary [name] on 12/11/19 at  9:00 AM EST by a video enabled telemedicine application and verified that I am speaking with the correct person using two identifiers.   I discussed the limitations of evaluation and management by telemedicine and the availability of in person appointments. The patient expressed understanding and agreed to proceed.  I discussed the assessment and treatment plan with the patient. The patient was provided an opportunity to ask questions and all were answered. The patient agreed with the plan and demonstrated an understanding of the instructions.   The patient was advised to call back or seek an in-person evaluation if the symptoms worsen or if the condition fails to improve as anticipated.  Pt was provided 240 minutes of non-face-to-face time during this encounter.   [name], LCSW    [facility] [place] PHP THERAPIST PROGRESS NOTE  Mary [name] [id number]  Session Time: 9:00 - 10:00  Participation Level: Active  Behavioral Response: CasualAlertDepressed  Type of Therapy: Group Therapy  Treatment Goals addressed: Coping  Interventions: CBT, DBT, Supportive and Reframing  Summary: Clinician led check-in regarding current stressors and situation, and review of patient completed daily inventory. Clinician utilized active listening and empathetic response and validated patient emotions. Clinician facilitated processing group on pertinent issues.   Therapist Response:Mary [name] is a 54 y.o. female who presents with depression symptoms. Patient arrived within time allowed and reports that she is feeling "lots of feelings." Patient rates hermood at a 6on a scale of 1-10 with 10 being great. Pt reports that she is feeling high anxiety this morning due to having a lot to do this afternoon. Pt states her afternoon "went to shit" after group yesterday and she struggled with other people's demands on  her time. Pt reports feelings of frustration, anxiety, and being taken for granted. Pt states she forced herself to go to bible study and it was a good thhng as it re-centered her.  Pt struggles with confidence to set boundaries with her family. Pt is able to process. Patient engaged in discussion.       Session Time: 10:00-11:00  Participation Level:Active  Behavioral Response:CasualAlertDepressed  Type of Therapy: Group Therapy, psychoeducation, psychotherapy  Treatment Goals addressed: Coping  Interventions:CBT, DBT, Solution Focused, Supportive and Reframing  Summary:Cln led discussion on communicating with people who are difficult to communicate with. Cln utilized tenets of boundary setting in terms of teaching people how to treat you and common communication struggles such as stone walling. Cln provided space for pt's to process frustrations.   Therapist Response:  Pt engaged in discussion and reports struggling to communicate with her husband and children. Pt shares difficulty with stone walling and feeling invalidated. Pt struggles to hold her ground after recieving a negative reaction. Pt able to process.     Session Time: 11:00- 12:00  Participation Level:Active  Behavioral Response:CasualAlertDepressed  Type of Therapy: Group Therapy, psychotherapy  Treatment Goals addressed: Coping  Interventions: Strengths based, reframing, Supportive,   Summary:  Spiritual Care group  Therapist Response: Patient engaged in group. See chaplain note.         Session Time: 12:00 -1:00  Participation Level:Active  Behavioral Response:CasualAlertDepressed  Type of Therapy: Group Therapy, Psychoeducation  Treatment Goals addressed: Coping  Interventions: relaxation training; Supportive; Reframing  Summary: 12:00 - 12:50: Relaxation group: Cln led group focused on retraining the body's response to stress.   12:50 -1:00 Clinician led  check-out. Clinician assessed for  immediate needs, medication compliance and efficacy, and safety concerns   Therapist Response: Patient engaged in activity and discussion. 12:50 - 1:00: At check-out, patient rates her mood at a 7 on a scale of 1-10 with 10 being great.Pt states afternoon plans of going to an interview, get testing done, and visit her daughter. Patient demonstrates someprogress as evidenced by increased awareness of problematic behaviors.Patient denies SI/HI/self-harm at the end of group.    Suicidal/Homicidal: Nowithout intent/plan  Plan: Pt will continue in PHP while working to decrease depression symptoms, increase ability to manage symptoms in a healthy manner, and increase emotional regulation.  Diagnosis: MDD (major depressive disorder), recurrent severe, without psychosis (HCC) [F33.2]    1. MDD (major depressive disorder), recurrent severe, without psychosis (HCC)       Donia Guiles, LCSW 12/18/2019

## 2019-12-18 NOTE — Psych (Signed)
Virtual Visit via Video Note  I connected with Mary [name] on 12/12/19 at  9:00 AM EST by a video enabled telemedicine application and verified that I am speaking with the correct person using two identifiers.   I discussed the limitations of evaluation and management by telemedicine and the availability of in person appointments. The patient expressed understanding and agreed to proceed.  I discussed the assessment and treatment plan with the patient. The patient was provided an opportunity to ask questions and all were answered. The patient agreed with the plan and demonstrated an understanding of the instructions.   The patient was advised to call back or seek an in-person evaluation if the symptoms worsen or if the condition fails to improve as anticipated.  Pt was provided 240 minutes of non-face-to-face time during this encounter.   [name], LCSW    [facility] [place] PHP THERAPIST PROGRESS NOTE  Mary [name] [id number]  Session Time: 9:00 - 10:00  Participation Level: Active  Behavioral Response: CasualAlertDepressed  Type of Therapy: Group Therapy  Treatment Goals addressed: Coping  Interventions: CBT, DBT, Supportive and Reframing  Summary: Clinician led check-in regarding current stressors and situation, and review of patient completed daily inventory. Clinician utilized active listening and empathetic response and validated patient emotions. Clinician facilitated processing group on pertinent issues.   Therapist Response:Mary [name] is a 54 y.o. female who presents with depression symptoms. Patient arrived within time allowed and reports that she is feeling "kind of down." Patient rates hermood at a 6on a scale of 1-10 with 10 being great. Pt reports her interview went well and she is feeling optimistic about the opportunity. Pt states she is preoccupied with a situation with her daughter last night. Pt reports ruminating about the discussion and  the way pt reacted. Pt states this interfered with her sleep last night and she is tired. Pt struggles to acknowledge her feelings are valid. Pt is able to process. Patient engaged in discussion.       Session Time: 10:00-11:00  Participation Level:Active  Behavioral Response:CasualAlertDepressed  Type of Therapy: Group Therapy, psychoeducation, psychotherapy  Treatment Goals addressed: Coping  Interventions:CBT, DBT, Solution Focused, Supportive and Reframing  Summary:Cln led boundary workshop in which pt's shared current boundary issues and group works together to problem solve. Cln coached group members on boundary tenets discussed so far and how to apply them. Cln helped pt's reframe situations through healthy boundaries lens.   Therapist Response:  Pt engaged in discussion and participated in workshop. Pt identifies boundary issue with her daughter in that her daughter speaks to her rudely and will be dismissive of pt's feelings. Pt able to process and accepts and gives feedback from cln and goup.       Session Time: 11:00- 12:00  Participation Level:Active  Behavioral Response:CasualAlertDepressed  Type of Therapy: Group Therapy, psychoeducation, psychotherapy  Treatment Goals addressed: Coping  Interventions:CBT, DBT, Solution Focused, Supportive and Reframing  Summary:Cln introduced topic of healthy communication. Cln led discussion on non-verbal forms of communication. Group identified ways non-verbal cues present and how they can be misinterpreted. Cln worked with group members to address non-verbal cues that may be causing them struggles in their communication and how to address them.   Therapist Response: Pt engaged in discussion and reports understanding of the role non-verbal cues play in healthy communication. Pt states she has gotten feedback that her tone and facial expression can come across in ways she does not intend.  Session Time: 12:00 -1:00  Participation Level:Active  Behavioral Response:CasualAlertDepressed  Type of Therapy: Group Therapy, Psychoeducation; Psychotherapy  Treatment Goals addressed: Coping  Interventions:CBT; Solution focused; Supportive; Reframing  Summary:12:00 - 12:50:Cln continued topic of healthy communication and introduced "I" Statements. Cln presented the "I" statement formula and how to utilize it. Group members practiced using the formula and discussed how it could improve their communication.  12:50 -1:00 Clinician led check-out. Clinician assessed for immediate needs, medication compliance and efficacy, and safety concerns  Therapist Response:12:00 - 12:50:Pt engaged in discussion and practice. Pt exhibits understanding of "I" statements by successfully formulating examples. Pt states she is often worried about how she presents herself and this formula could increase her self-confidence in communicating.  12:50 - 1:00: At check-out, patient rates her mood at a 4 on a scale of 1-10 with 10 being great.Pt states afternoon plans of taking a nap wand watching tv. Patient demonstrates someprogress as evidenced by seeking to work on struggles despite negative current emotions.Patient denies SI/HI/self-harm at the end of group.    Suicidal/Homicidal: Nowithout intent/plan  Plan: Pt will continue in PHP while working to decrease depression symptoms, increase ability to manage symptoms in a healthy manner, and increase emotional regulation.  Diagnosis: MDD (major depressive disorder), recurrent severe, without psychosis (HCC) [F33.2]    1. MDD (major depressive disorder), recurrent severe, without psychosis (HCC)       Donia Guiles, LCSW 12/18/2019

## 2019-12-19 ENCOUNTER — Other Ambulatory Visit (HOSPITAL_COMMUNITY): Payer: BC Managed Care – PPO

## 2019-12-19 ENCOUNTER — Encounter: Payer: Self-pay | Admitting: Psychiatry

## 2019-12-19 ENCOUNTER — Ambulatory Visit (HOSPITAL_COMMUNITY): Payer: BC Managed Care – PPO

## 2019-12-19 ENCOUNTER — Other Ambulatory Visit: Payer: Self-pay

## 2019-12-19 NOTE — Psych (Signed)
Virtual Visit via Video Note  I connected with Mary [name] on 12/13/19 at  9:00 AM EST by a video enabled telemedicine application and verified that I am speaking with the correct person using two identifiers.   I discussed the limitations of evaluation and management by telemedicine and the availability of in person appointments. The patient expressed understanding and agreed to proceed.  I discussed the assessment and treatment plan with the patient. The patient was provided an opportunity to ask questions and all were answered. The patient agreed with the plan and demonstrated an understanding of the instructions.   The patient was advised to call back or seek an in-person evaluation if the symptoms worsen or if the condition fails to improve as anticipated.  Pt was provided 60 minutes of non-face-to-face time during this encounter.   Donia Guiles, LCSW    [facility] PHP THERAPIST PROGRESS NOTE  ALLAYAH RAINERI [id number]  Pt was in group for approximately 60 broken minutes of group over 2 hours. Pt presented late with disheveled appearance and tearful affect. Pt states she and her husband have been fighting since midday yesterday and she is "upset" and "really struggling." Pt denies violence or fear for her safety. Pt states she doesn't know if she can sit through group. Cln had pt take deep breaths alongside cln to activate body de-stress reaction. Cln encouraged pt to get out of the house and distract herself. Pt states plans to go to her friend's house and spend time with her. Pt denies SI/HI/self-harm thoughts. Cln reviews crisis information of the crisis line and presenting to ED/[facility] should safety become a concern. Pt states understanding of crisis information.    Suicidal/Homicidal: Nowithout intent/plan  Plan: Pt will continue in PHP while working to decrease depression symptoms, increase ability to manage symptoms in a healthy manner, and increase emotional  regulation.  Diagnosis: MDD (major depressive disorder), recurrent severe, without psychosis (HCC) [F33.2]    1. MDD (major depressive disorder), recurrent severe, without psychosis (HCC)       Donia Guiles, LCSW 12/19/2019

## 2019-12-19 NOTE — Psych (Signed)
Virtual Visit via Video Note  I connected with [name] on 12/16/19 at  9:00 AM EST by a video enabled telemedicine application and verified that I am speaking with the correct person using two identifiers.   I discussed the limitations of evaluation and management by telemedicine and the availability of in person appointments. The patient expressed understanding and agreed to proceed.  I discussed the assessment and treatment plan with the patient. The patient was provided an opportunity to ask questions and all were answered. The patient agreed with the plan and demonstrated an understanding of the instructions.   The patient was advised to call back or seek an in-person evaluation if the symptoms worsen or if the condition fails to improve as anticipated.  Pt was provided 240 minutes of non-face-to-face time during this encounter.   Donia Guiles, LCSW    [facility] PHP THERAPIST PROGRESS NOTE  SANYIAH KANZLER [id number]  Session Time: 9:00 - 10:00  Participation Level: Active  Behavioral Response: CasualAlertDepressed  Type of Therapy: Group Therapy  Treatment Goals addressed: Coping  Interventions: CBT, DBT, Supportive and Reframing  Summary: Clinician led check-in regarding current stressors and situation, and review of patient completed daily inventory. Clinician utilized active listening and empathetic response and validated patient emotions. Clinician facilitated processing group on pertinent issues.   Therapist Response:Eiman D Longshore is a 54 y.o. female who presents with depression symptoms. Patient arrived within time allowed and reports that she is feeling "kind of down." Patient rates hermood at a 6on a scale of 1-10 with 10 being great. Pt reports Friday and Saturday were "rough" however positive things came out of it. Pt reports the conflict with her husband continued through Saturday evening. Pt reports she is proud of herself because she was  assertive and didn't back down from her feelings despite negative reaction. Pt states she feels she and her husband have a better understanding of each other and she feels he is trying to be more supportive. Pt shares that she was offered a new job position and she is very excited about it. Pt states she attempted to use skills over the weekend and struggled with consistency. Pt is able to process. Patient engaged in discussion.       Session Time: 10:00-11:00  Participation Level:Active  Behavioral Response:CasualAlertDepressed  Type of Therapy: Group Therapy, psychoeducation, psychotherapy  Treatment Goals addressed: Coping  Interventions:CBT, DBT, Solution Focused, Supportive and Reframing  Summary:Cln led dicussion on empathy and using it as reframing tool when frustrated or struggling in interpersonal conflict. Cln defined empathy and group discussed ways they interact with empathy. Cln highlighted moments within stories from group members' conflict in which empathy could be harnessed. When harnessing empathy cln discussed benefits including emotional de-escalation, improved understanding, and disarming the other person. Group identified times this would be helpful for them.   Therapist Response:  Pt engaged in discussion and reports understanding. Pt states she is an empathetic person however is more focused on her feelings when escalated. Pt states concern she may use empathy as an excuse to have poor boundaries and is able to process that. Pt identifies this could be helpful to use in work relationships.      Session Time: 11:00- 12:00  Participation Level:Active  Behavioral Response:CasualAlertDepressed  Type of Therapy: Group Therapy, psychoeducation, psychotherapy  Treatment Goals addressed: Coping  Interventions:CBT, DBT, Solution Focused, Supportive and Reframing  Summary:Cln introduced topic of the cognitive model from CBT. Cln discussed the  thought-feelings-behaviors triangle  and how reframing thoughts can alter the feelings and behaviors that follow. Cln utilized generic examples and group discussed how to reframe thought and problem-solved struggles.   Therapist Response: Pt participated in discussion and practice and reports understanding. Pt identifies it is a foreign concept that she can alter her thoughts or feelings however is hopeful about the concept.        Session Time: 12:00 -1:00  Participation Level:Active  Behavioral Response:CasualAlertDepressed  Type of Therapy: Group Therapy, Psychoeducation; Psychotherapy  Treatment Goals addressed: Coping  Interventions:CBT; Solution focused; Supportive; Reframing  Summary:12:00 - 12:50:Cln introduced topic of cognitive distortions and their role in negatively impacting the feelings and behaviors components of the cognitive triangle. Cln began reivew of cognitive distortion examples and group identified ways these distortions have presented in their life and how problematic they find them.   12:50 -1:00 Clinician led check-out. Clinician assessed for immediate needs, medication compliance and efficacy, and safety concerns  Therapist Response:12:00 - 12:50:Pt engaged in discussion and reports understanding of cognitive distortions. Pt is able to determine examples of the distortions in her life. Pt reports mind reading and personalizing as particularly problematic for her.  12:50 - 1:00: At check-out, patient rates her mood at a 7.5 on a scale of 1-10 with 10 being great.Pt states afternoon plans of organizing her bedroom and working on job tasks. Patient demonstrates someprogress as evidenced by applying communication and boundary skills over the weekend.Patient denies SI/HI/self-harm at the end of group.    Suicidal/Homicidal: Nowithout intent/plan  Plan: Pt will continue in PHP while working to decrease depression symptoms, increase ability to  manage symptoms in a healthy manner, and increase emotional regulation.  Diagnosis: MDD (major depressive disorder), recurrent severe, without psychosis ([place]) [F33.2]    1. MDD (major depressive disorder), recurrent severe, without psychosis ([place])       [name], LCSW 12/19/2019

## 2019-12-19 NOTE — Progress Notes (Signed)
GROUP NOTE -  Spiritual care group 12/17/2018 11:00 - 12:00 ? Group met via web-ex due to COVID-19 precautions.  Group facilitated by Simone Curia, MDiv, BCC  ? ? Group focused on topic of strength. ?Group members reflected on what thoughts and feelings emerge when they hear this topic. ?They then engaged in facilitated dialog around how strength is present in their lives. This dialog focused on representing what strength had been to them in their lives (images and patterns given) and what they saw as helpful in their life now (what they needed / wanted). ? ? Activity drew on narrative framework    Mary [name] was present throughout group.  She initially expressed difficulty connecting with topic of strength.  She identified feeling strong when her child was injured in an accident and she spent her days at hospital praying for him.   She noted faith as a source of strength.  She later processed with group an experience of "standing up for herself" in conversation with her spouse.  She identified strength as "claiming space" and "my feelings are ok"   She spoke with group about practicing coping strategies when she is feeling overwhelmed in conversations.  Received support from group.

## 2019-12-20 ENCOUNTER — Ambulatory Visit (HOSPITAL_COMMUNITY): Payer: BC Managed Care – PPO

## 2019-12-20 ENCOUNTER — Telehealth (HOSPITAL_COMMUNITY): Payer: Self-pay | Admitting: Psychiatry

## 2019-12-20 ENCOUNTER — Other Ambulatory Visit (HOSPITAL_COMMUNITY): Payer: BC Managed Care – PPO

## 2019-12-20 NOTE — Telephone Encounter (Signed)
D:  Pt phoned to inform case manager that she has decided not to attend [facility].  "I want to take some time away from group while I prepare to start the new job."  A:  Scheduled pt to f/u with Idalia Needle, LCAS on 12-25-19 @ 11 a.m and Dr. Quintella Baton on 01-09-20 @ 1pm.  Encouraged support groups.  R:  Pt receptive.

## 2019-12-23 ENCOUNTER — Telehealth: Payer: Self-pay | Admitting: "Endocrinology

## 2019-12-24 ENCOUNTER — Other Ambulatory Visit: Payer: Self-pay | Admitting: Obstetrics and Gynecology

## 2019-12-24 DIAGNOSIS — N644 Mastodynia: Secondary | ICD-10-CM

## 2019-12-24 NOTE — Psych (Addendum)
Virtual Visit via Video Note  I connected with Mary [name] on 12/17/19 at  9:00 AM EST by a video enabled telemedicine application and verified that I am speaking with the correct person using two identifiers.   I discussed the limitations of evaluation and management by telemedicine and the availability of in person appointments. The patient expressed understanding and agreed to proceed.  I discussed the assessment and treatment plan with the patient. The patient was provided an opportunity to ask questions and all were answered. The patient agreed with the plan and demonstrated an understanding of the instructions.   The patient was advised to call back or seek an in-person evaluation if the symptoms worsen or if the condition fails to improve as anticipated.  Pt was provided 240 minutes of non-face-to-face time during this encounter.   Donia Guiles, LCSW    [facility] PHP THERAPIST PROGRESS NOTE  Mary [name] [id number]  Session Time: 9:00 - 10:00  Participation Level: Active  Behavioral Response: CasualAlertDepressed  Type of Therapy: Group Therapy  Treatment Goals addressed: Coping  Interventions: CBT, DBT, Supportive and Reframing  Summary: Clinician led check-in regarding current stressors and situation, and review of patient completed daily inventory. Clinician utilized active listening and empathetic response and validated patient emotions. Clinician facilitated processing group on pertinent issues.   Therapist Response:Mary [name] is a 54 y.o. female who presents with depression symptoms. Patient arrived within time allowed and reports that she is feeling "really good." Patient rates hermood at a 8on a scale of 1-10 with 10 being great. Pt reports she is feeling "at peace" and "positive" this morning due to spending time in prayer. Pt shares she spoke to her middle daughter yesterday and that boosted pt's mood and she is feeling hopeful for her.  Pt states an issue with her neighbor yesterday and reporting she was proud of herself for managing her reactions when emotional. Pt reports struggle with anxiety regarding going into work today. Pt is able to process. Patient engaged in discussion.       Session Time: 10:00-11:00  Participation Level:Active  Behavioral Response:CasualAlertDepressed  Type of Therapy: Group Therapy, psychoeducation, psychotherapy  Treatment Goals addressed: Coping  Interventions:CBT, DBT, Solution Focused, Supportive and Reframing  Summary:Cln led discussion on control. Cln made connections between interpersonal frustrations and control. Cln encouraged pt's to consider when they are struggling whether the issue at hand is something within their control. Group discussed current struggles and cln and group members worked to identify what areas of the issue are within our control and what areas are not. Cln redirected pt's to focus on areas in which they have control.      Therapist Response: Pt engaged in discussion and identifies multiple areas in which they are seeking to control situations outside of their control. Pt specifically recognizes people's reactions to her, and her daughter's addiction as areas outside of her control. Pt able to process.       Session Time: 11:00- 12:00  Participation Level:Active  Behavioral Response:CasualAlertDepressed  Type of Therapy: Group Therapy, OT  Treatment Goals addressed: Coping  Interventions:Psychosocial skills training, Supportive,   Summary:Occupational Therapy group  Therapist Response:Patient engaged in group. See OT note.       Session Time: 12:00 -1:00  Participation Level:Active  Behavioral Response:CasualAlertDepressed  Type of Therapy: Group Therapy, Psychoeducation; Psychotherapy  Treatment Goals addressed: Coping  Interventions:CBT; Solution focused; Supportive;  Reframing  Summary:12:00 - 12:50:Cln continued topic of cognitive distortions. Group continued to review  examples of common cognitive distortions, utilizing handout "common unhealthy thought patterns" and discussed how these present in their lives and to what level are they problematic.  12:50 -1:00 Clinician led check-out. Clinician assessed for immediate needs, medication compliance and efficacy, and safety concerns      Therapist Response: Pt engaged in discussion and is able to identify examples of how these distrortions show up for her. Pt reports emotional reasoning, fallacy of fairness, and should statements as most problematic for her.  12:50 - 1:00: At check-out, patient rates her mood at a 8 on a scale of 1-10 with 10 being great.Pt states afternoon plans of going by work, spending time outside, and doing self-care. Patient demonstrates someprogress as evidenced by practicing emotion regulation when upset.Patient denies SI/HI/self-harm at the end of group.    Suicidal/Homicidal: Nowithout intent/plan  Plan: Pt will continue in PHP while working to decrease depression symptoms, increase ability to manage symptoms in a healthy manner, and increase emotional regulation.  Diagnosis: MDD (major depressive disorder), recurrent severe, without psychosis ([place]) [F33.2]    1. MDD (major depressive disorder), recurrent severe, without psychosis ([place])   2. Adjustment disorder with mixed anxiety and depressed mood   3. Frontal lobe and executive function deficit       Mary Glass, LCSW 12/24/2019

## 2019-12-25 ENCOUNTER — Ambulatory Visit (INDEPENDENT_AMBULATORY_CARE_PROVIDER_SITE_OTHER): Payer: BC Managed Care – PPO | Admitting: Licensed Clinical Social Worker

## 2019-12-25 ENCOUNTER — Other Ambulatory Visit: Payer: Self-pay

## 2019-12-25 ENCOUNTER — Encounter (HOSPITAL_COMMUNITY): Payer: Self-pay | Admitting: Licensed Clinical Social Worker

## 2019-12-25 DIAGNOSIS — F332 Major depressive disorder, recurrent severe without psychotic features: Secondary | ICD-10-CM

## 2019-12-25 NOTE — Progress Notes (Signed)
Virtual Visit via Video Note  I connected with Mary [name] on 12/25/19 at 11:00 AM EST by a video enabled telemedicine application and verified that I am speaking with the correct person using two identifiers.   I discussed the limitations of evaluation and management by telemedicine and the availability of in person appointments. The patient expressed understanding and agreed to proceed.  History of Present Illness: Patient was referred to individual therapy after completion of PHP for depression and adjustment disorder.    Observations/Objective: Patient presented for her initial individual counseling session. Patient discussed her psychiatric symptoms and current life events. Spent much of the session building a trusting, therapeutic relationship. Patient reports her current stressors: work and her daughter. Patient resigned her job Monday and starts a new job next Monday. Discussed current coping skills. Discussed with patient closure of her current job, feelings around resignation and treatment on the job. Validated her feelings.   Assessment and Plan: Counselor will continue to meet with patient to address treatment plan goals. Patient will continue to follow recommendations of providers and implement skills learned in session.  Follow Up Instructions:  I discussed the assessment and treatment plan with the patient. The patient was provided an opportunity to ask questions and all were answered. The patient agreed with the plan and demonstrated an understanding of the instructions.   The patient was advised to call back or seek an in-person evaluation if the symptoms worsen or if the condition fails to improve as anticipated.  I provided 60 minutes of non-face-to-face time during this encounter.   Emilea Goga S, LCAS

## 2020-01-09 ENCOUNTER — Ambulatory Visit (INDEPENDENT_AMBULATORY_CARE_PROVIDER_SITE_OTHER): Payer: Self-pay | Admitting: Family Medicine

## 2020-01-09 ENCOUNTER — Other Ambulatory Visit: Payer: Self-pay

## 2020-01-09 ENCOUNTER — Ambulatory Visit (INDEPENDENT_AMBULATORY_CARE_PROVIDER_SITE_OTHER): Payer: BC Managed Care – PPO | Admitting: Psychiatry

## 2020-01-09 ENCOUNTER — Encounter: Payer: Self-pay | Admitting: Family Medicine

## 2020-01-09 ENCOUNTER — Encounter: Payer: Self-pay | Admitting: Psychiatry

## 2020-01-09 DIAGNOSIS — F3181 Bipolar II disorder: Secondary | ICD-10-CM

## 2020-01-09 DIAGNOSIS — J329 Chronic sinusitis, unspecified: Secondary | ICD-10-CM

## 2020-01-09 DIAGNOSIS — F419 Anxiety disorder, unspecified: Secondary | ICD-10-CM | POA: Insufficient documentation

## 2020-01-09 DIAGNOSIS — F4010 Social phobia, unspecified: Secondary | ICD-10-CM

## 2020-01-09 DIAGNOSIS — J310 Chronic rhinitis: Secondary | ICD-10-CM

## 2020-01-09 MED ORDER — DESVENLAFAXINE SUCCINATE ER 50 MG PO TB24: 50.0000 mg | ORAL_TABLET | Freq: Every day | ORAL | 1 refills | Status: DC

## 2020-01-09 MED ORDER — CEPHALEXIN 500 MG PO CAPS: 500.0000 mg | ORAL_CAPSULE | Freq: Three times a day (TID) | ORAL | 0 refills | Status: DC

## 2020-01-09 MED ORDER — LAMOTRIGINE 25 MG PO TABS: ORAL_TABLET | ORAL | 1 refills | Status: DC
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
## 2020-01-09 NOTE — Progress Notes (Signed)
   Subjective:    Patient ID: Mary [name], female    DOB: [birth year], 54 y.o.   MRN: [id number]  Sinusitis The current episode started in the past 7 days. There has been no fever. Associated symptoms include congestion, ear pain, headaches and sinus pressure. Past treatments include oral decongestants and acetaminophen.     Virtual Visit via Video Note  I connected with [name] on 01/09/20 at  9:30 AM EDT by a video enabled telemedicine application and verified that I am speaking with the correct person using two identifiers.  Location: Patient: home Provider: office   I discussed the limitations of evaluation and management by telemedicine and the availability of in person appointments. The patient expressed understanding and agreed to proceed.  History of Present Illness:    Observations/Objective:   Assessment and Plan:   Follow Up Instructions:    I discussed the assessment and treatment plan with the patient. The patient was provided an opportunity to ask questions and all were answered. The patient agreed with the plan and demonstrated an understanding of the instructions.   The patient was advised to call back or seek an in-person evaluation if the symptoms worsen or if the condition fails to improve as anticipated. 21 minutes of non-face-to-face time during this encounter.     Review of Systems  HENT: Positive for congestion, ear pain and sinus pressure.   Neurological: Positive for headaches.       Objective:   Physical Exam  Virtual      Assessment & Plan:  Impression acute rhinosinusitis.  Patient has already had COVID-19.  Recurrence of congestion.  Drainage.  Gunky discharge.  Frontal headache.  Symptom care discussed.  Antibiotics prescribed

## 2020-01-13 ENCOUNTER — Telehealth: Payer: Self-pay | Admitting: Family Medicine

## 2020-01-13 MED ORDER — CEFPROZIL 500 MG PO TABS: 500.0000 mg | ORAL_TABLET | Freq: Two times a day (BID) | ORAL | 0 refills | Status: DC

## 2020-01-13 NOTE — Telephone Encounter (Signed)
Cefzil 500 bid ten d

## 2020-01-13 NOTE — Telephone Encounter (Signed)
Patient was put on cephalexin 500 mg and she had to stop taking it because it was making her stomach hurt so bad with cramps. Can you call in something different to [facility]

## 2020-01-13 NOTE — Telephone Encounter (Signed)
Prescription sent electronically to pharmacy. Patient notified. 

## 2020-01-22 ENCOUNTER — Other Ambulatory Visit: Payer: Self-pay

## 2020-01-22 ENCOUNTER — Encounter (HOSPITAL_COMMUNITY): Payer: Self-pay | Admitting: Licensed Clinical Social Worker

## 2020-01-22 ENCOUNTER — Ambulatory Visit (INDEPENDENT_AMBULATORY_CARE_PROVIDER_SITE_OTHER): Payer: BC Managed Care – PPO | Admitting: Licensed Clinical Social Worker

## 2020-01-22 DIAGNOSIS — F3181 Bipolar II disorder: Secondary | ICD-10-CM

## 2020-01-22 DIAGNOSIS — F4010 Social phobia, unspecified: Secondary | ICD-10-CM

## 2020-01-22 NOTE — Progress Notes (Signed)
Virtual Visit via Video Note  I connected with [name] on 01/22/20 at 12:00 PM EDT by a video enabled telemedicine application and verified that I am speaking with the correct person using two identifiers.   I discussed the limitations of evaluation and management by telemedicine and the availability of in person appointments. The patient expressed understanding and agreed to proceed.  History of Present Illness: Patient was referred to individual therapy after completion of PHP for depression and adjustment disorder.    Observations/Objective: Patient presented for her individual counseling session. Patient discussed her psychiatric symptoms and current life events. Patient reports her current stressors: work and her daughter. Discussed current coping skills. Patient described her new job along with the stress. Provided foundation for patient to process her new job stress. Patient also began discussion of her marriages. Patient reports she will have new insurance in April, [facility]. I told patient will have to refer her out to another therapist who can bill under [facility].  Assessment and Plan: Counselor will continue to meet with patient to address treatment plan goals. Patient will continue to follow recommendations of providers and implement skills learned in session.  Follow Up Instructions:  I discussed the assessment and treatment plan with the patient. The patient was provided an opportunity to ask questions and all were answered. The patient agreed with the plan and demonstrated an understanding of the instructions.   The patient was advised to call back or seek an in-person evaluation if the symptoms worsen or if the condition fails to improve as anticipated.  I provided 25 minutes of non-face-to-face time during this encounter.   Nayra Coury S, LCAS

## 2020-01-27 ENCOUNTER — Other Ambulatory Visit: Payer: BC Managed Care – PPO

## 2020-02-05 ENCOUNTER — Encounter: Payer: Self-pay | Admitting: Family Medicine

## 2020-02-05 NOTE — Telephone Encounter (Signed)
Mary Albert, MD     For such a not well understood condition that is no longer even managed by the experts (rheumatologists) who created that diagnosis as an explanation for chronic muscle and fascia pain, I cannot answer that

## 2020-02-06 NOTE — Telephone Encounter (Signed)
  Hi,   I tend to treat fibromyalgia with combination medications- anti-inflammatories and symptomatic treatment for their pain with muscle relaxants if needed/anti-depressants etc.  And recommend continuing to exercise.     Thanks,   Dr. Ladona Ridgel

## 2020-02-12 ENCOUNTER — Ambulatory Visit: Payer: Commercial Managed Care - PPO | Admitting: Family Medicine

## 2020-02-12 ENCOUNTER — Encounter: Payer: Self-pay | Admitting: Family Medicine

## 2020-02-12 ENCOUNTER — Other Ambulatory Visit: Payer: Self-pay

## 2020-02-12 VITALS — BP 118/78 | HR 98 | Temp 98.3°F | Wt 170.4 lb

## 2020-02-12 DIAGNOSIS — M62830 Muscle spasm of back: Secondary | ICD-10-CM | POA: Diagnosis not present

## 2020-02-12 DIAGNOSIS — M797 Fibromyalgia: Secondary | ICD-10-CM

## 2020-02-12 DIAGNOSIS — E559 Vitamin D deficiency, unspecified: Secondary | ICD-10-CM

## 2020-02-12 MED ORDER — MELOXICAM 7.5 MG PO TABS: 7.5000 mg | ORAL_TABLET | Freq: Two times a day (BID) | ORAL | 3 refills | Status: DC

## 2020-02-12 MED ORDER — TRAMADOL HCL 50 MG PO TABS: ORAL_TABLET | ORAL | 0 refills | Status: DC

## 2020-02-12 NOTE — Progress Notes (Signed)
Patient ID: Mary [name], female    DOB: Mar 20, 1966, 54 y.o.   MRN: [id number]   Chief Complaint  Patient presents with  . Fibromyalgia   Subjective:   HPI Pt seen for follow up on fibromyalgia.  Has seen a few rheumatologists in the past.  But due to insurance reasons wasn't able to follow up and then the doctor's panels were full and not taking new pts.   Has had in 2020- bone scan and chest CT.  Not showing any reasons for her upper back/shoulder pains.   Has had fibromyalgia for years, dx by Dr. Cherrie Distance in past.  Has had tramadol in past.  Has tried many medications w/o much relief, cymbalta, muscle relaxants, and pain medications. Not wanting to try medications that she would get "addicted" to, but wanting something to help with the pain.  Pt mentioning she used to take vit d supplement weekly, she is wanting to check to see if needing this again.   Medical History Mary [name] has a past medical history of Fibromyalgia, Migraine, and Thyroid disease.   Outpatient Encounter Medications as of 02/12/2020  Medication Sig  . albuterol (VENTOLIN HFA) 108 (90 Base) MCG/ACT inhaler Inhale 2 puffs po QID prn wheezing (Patient not taking: Reported on 12/04/2019)  . cyclobenzaprine (FLEXERIL) 10 MG tablet cyclobenzaprine 10 mg tablet  TAKE 1 TABLET BY MOUTH THREE TIMES DAILY  . desvenlafaxine (PRISTIQ) 50 MG 24 hr tablet Take 1 tablet (50 mg total) by mouth daily.  . DULoxetine (CYMBALTA) 30 MG capsule duloxetine 30 mg capsule,delayed release  TAKE 1 CAPSULE BY MOUTH TWICE DAILY  . escitalopram (LEXAPRO) 10 MG tablet escitalopram 10 mg tablet  TAKE 1 TABLET BY MOUTH DAILY. WEAN EXISTING FLUOXETINE RX ONCE LEXAPRO STARTED AS FOLLOWS; TAKE 1 CAPSULE NIGHTLY FOR 1 WEEK ON 2ND WEEK TAK  . etodolac (LODINE) 400 MG tablet etodolac 400 mg tablet  TAKE 1 TABLET BY MOUTH TWICE DAILY AS NEEDED WITH FOOD  . lamoTRIgine (LAMICTAL) 25 MG tablet Take 1 tablet daily for 2 weeks then take 2  tablets daily  . meloxicam (MOBIC) 7.5 MG tablet Take 1 tablet (7.5 mg total) by mouth in the morning and at bedtime.  Marland Kitchen SYNTHROID 75 MCG tablet Take 1 tablet (75 mcg total) by mouth daily before breakfast.  . tiZANidine (ZANAFLEX) 4 MG tablet Take one half tablet qam, one half tablet in the evening and one whole tablet qhs  . traMADol (ULTRAM) 50 MG tablet Take 1 tab p.o. q8hrs prn pain  . [DISCONTINUED] cefPROZIL (CEFZIL) 500 MG tablet Take 1 tablet (500 mg total) by mouth 2 (two) times daily.   No facility-administered encounter medications on file as of 02/12/2020.     Review of Systems  Constitutional: Negative for chills and fever.  HENT: Negative for congestion, rhinorrhea and sore throat.   Respiratory: Negative for cough, shortness of breath and wheezing.   Cardiovascular: Negative for chest pain and leg swelling.  Gastrointestinal: Negative for abdominal pain, diarrhea, nausea and vomiting.  Genitourinary: Negative for dysuria and frequency.  Musculoskeletal: Positive for back pain and neck pain. Negative for arthralgias.  Skin: Negative for rash.  Neurological: Negative for dizziness, weakness and headaches.     Vitals BP 118/78   Pulse 98   Temp 98.3 F (36.8 C)   Wt 170 lb 6.4 oz (77.3 kg)   SpO2 99%   BMI 30.19 kg/m   Objective:   Physical Exam Vitals and nursing note reviewed.  Constitutional:      General: She is not in acute distress.    Appearance: Normal appearance. She is normal weight. She is not ill-appearing.  HENT:     Head: Normocephalic and atraumatic.  Cardiovascular:     Rate and Rhythm: Normal rate and regular rhythm.     Pulses: Normal pulses.     Heart sounds: Normal heart sounds.  Pulmonary:     Effort: Pulmonary effort is normal. No respiratory distress.     Breath sounds: Normal breath sounds.  Musculoskeletal:        General: No swelling, deformity or signs of injury.     Cervical back: Normal, normal range of motion and neck  supple. No swelling, rigidity, tenderness or bony tenderness. Normal range of motion.     Thoracic back: Tenderness present. No swelling or bony tenderness. Lacerations: ttp over thoracic paraspinal areas bilateral, and trapezius. Decreased range of motion.     Lumbar back: Normal.     Right lower leg: No edema.     Left lower leg: No edema.  Skin:    General: Skin is warm and dry.     Findings: No rash.  Neurological:     General: No focal deficit present.     Mental Status: She is alert and oriented to person, place, and time.     Cranial Nerves: No cranial nerve deficit.     Sensory: No sensory deficit.     Motor: No weakness.     Gait: Gait normal.     Deep Tendon Reflexes: Reflexes normal.  Psychiatric:        Mood and Affect: Mood normal.        Behavior: Behavior normal.      Assessment and Plan   1. Fibromyalgia affecting multiple sites - meloxicam (MOBIC) 7.5 MG tablet; Take 1 tablet (7.5 mg total) by mouth in the morning and at bedtime.  Dispense: 60 tablet; Refill: 3 - traMADol (ULTRAM) 50 MG tablet; Take 1 tab p.o. q8hrs prn pain  Dispense: 45 tablet; Refill: 0  2. Spasm of thoracic back muscle - traMADol (ULTRAM) 50 MG tablet; Take 1 tab p.o. q8hrs prn pain  Dispense: 45 tablet; Refill: 0 - Ambulatory referral to Chiropractic  3. Vitamin D deficiency - Vitamin D 1,25 dihydroxy    For fibromyalgia and upper back spasm/muscle spasm- adding meloxicam and tramadol prn.  Pt can cont with zanaflex prn. Reviewed the images of bones scan and CT chest- not remarkable. Heat/ice and fascia scraping to help with the pain.  F/u in 3 months or prn.

## 2020-02-17 ENCOUNTER — Encounter: Payer: Self-pay | Admitting: Family Medicine

## 2020-02-20 ENCOUNTER — Other Ambulatory Visit: Payer: Self-pay

## 2020-02-20 ENCOUNTER — Telehealth: Payer: Self-pay | Admitting: Psychiatry

## 2020-02-21 ENCOUNTER — Ambulatory Visit: Payer: BC Managed Care – PPO | Admitting: "Endocrinology

## 2020-02-25 ENCOUNTER — Encounter: Payer: Self-pay | Admitting: Family Medicine

## 2020-02-26 ENCOUNTER — Telehealth (INDEPENDENT_AMBULATORY_CARE_PROVIDER_SITE_OTHER): Payer: Self-pay | Admitting: Psychiatry

## 2020-02-26 ENCOUNTER — Encounter: Payer: Self-pay | Admitting: Psychiatry

## 2020-02-26 ENCOUNTER — Other Ambulatory Visit: Payer: Self-pay

## 2020-02-26 DIAGNOSIS — F4010 Social phobia, unspecified: Secondary | ICD-10-CM | POA: Insufficient documentation

## 2020-02-26 DIAGNOSIS — F3181 Bipolar II disorder: Secondary | ICD-10-CM

## 2020-02-26 MED ORDER — DESVENLAFAXINE SUCCINATE ER 50 MG PO TB24: 50.0000 mg | ORAL_TABLET | Freq: Every day | ORAL | 1 refills | Status: DC

## 2020-02-26 MED ORDER — LAMOTRIGINE 25 MG PO TABS: ORAL_TABLET | ORAL | 1 refills | Status: DC

## 2020-02-26 NOTE — Progress Notes (Signed)
BH MD/PA/NP OP Progress Note  I connected with  Mary [name] on 02/26/20 by a video enabled telemedicine application and verified that I am speaking with the correct person using two identifiers.   I discussed the limitations of evaluation and management by telemedicine. The patient expressed understanding and agreed to proceed.    02/26/2020 1:53 PM Mary [name]  MRN:  [id number]  Chief Complaint:  " I am doing so much better."  HPI: Pt reported that she feels she is doing very well. She has started her new job and she is really liking it there. She informed that the new medication combination of Pristiq and Lamictal has been very helpful. She informed that she has noticed significant improvement in her mood, energy levels, anxiety and irritability. She also feels that her memory has improved. She stated that she has been taking pristiq in the morning and takes one tab of Lamictal at bedtime. She stated that she still takes Zanaflex at bedtime and is able to sleep well with help of that. Pt was advised to increase the dose of Lamictal to 50 mg as per the original plan and was recommended to continue same combination. Pt was agreeable to this.  Visit Diagnosis:    ICD-10-CM   1. Bipolar 2 disorder (HCC)  F31.81   2. Social anxiety disorder  F40.10     Past Psychiatric History: Depression, anxiety  Past Medical History:  Past Medical History:  Diagnosis Date  . Fibromyalgia   . Migraine   . Thyroid disease     Past Surgical History:  Procedure Laterality Date  . TONSILLECTOMY    . TUBAL LIGATION    . uterine ablation      Family Psychiatric History: Maternal grandmother and mother depression/anxiety. Paternal uncle committed suicide. Patient's daughter 15 years old-reported struggling with substance abuse and was recently released from jail.   Family History:  Family History  Problem Relation Age of Onset  . Heart disease Mother   . Depression Mother   .  Heart disease Father   . Depression Father   . Depression Maternal Uncle     Social History:  Social History   Socioeconomic History  . Marital status: Married    Spouse name: Not on file  . Number of children: Not on file  . Years of education: Not on file  . Highest education level: Not on file  Occupational History  . Not on file  Tobacco Use  . Smoking status: Former Smoker    Types: Cigarettes  . Smokeless tobacco: Never Used  . Tobacco comment: quit feb 2016  Substance and Sexual Activity  . Alcohol use: No    Alcohol/week: 0.0 standard drinks  . Drug use: No  . Sexual activity: Yes    Birth control/protection: Surgical  Other Topics Concern  . Not on file  Social History Narrative  . Not on file   Social Determinants of Health   Financial Resource Strain:   . Difficulty of Paying Living Expenses:   Food Insecurity:   . Worried About Programme researcher, broadcasting/film/video in the Last Year:   . Barista in the Last Year:   Transportation Needs:   . Freight forwarder (Medical):   Marland Kitchen Lack of Transportation (Non-Medical):   Physical Activity:   . Days of Exercise per Week:   . Minutes of Exercise per Session:   Stress:   . Feeling of Stress :   Social Connections:   .  Frequency of Communication with Friends and Family:   . Frequency of Social Gatherings with Friends and Family:   . Attends Religious Services:   . Active Member of Clubs or Organizations:   . Attends Archivist Meetings:   Marland Kitchen Marital Status:     Allergies:  Allergies  Allergen Reactions  . Augmentin [Amoxicillin-Pot Clavulanate]     swelling  . Bactrim [Sulfamethoxazole-Trimethoprim]     Lips swelling    Metabolic Disorder Labs: Lab Results  Component Value Date   HGBA1C 5.6 08/16/2019   MPG 114 08/16/2019   No results found for: PROLACTIN Lab Results  Component Value Date   CHOL 217 (H) 08/16/2019   TRIG 89 08/16/2019   HDL 44 (L) 08/16/2019   CHOLHDL 4.9 08/16/2019    LDLCALC 154 (H) 08/16/2019   LDLCALC 137 (H) 04/19/2018   Lab Results  Component Value Date   TSH 1.37 12/03/2019   TSH 0.94 08/16/2019    Therapeutic Level Labs: No results found for: LITHIUM No results found for: VALPROATE No components found for:  CBMZ  Current Medications: Current Outpatient Medications  Medication Sig Dispense Refill  . albuterol (VENTOLIN HFA) 108 (90 Base) MCG/ACT inhaler Inhale 2 puffs po QID prn wheezing (Patient not taking: Reported on 12/04/2019) 18 g 0  . cyclobenzaprine (FLEXERIL) 10 MG tablet cyclobenzaprine 10 mg tablet  TAKE 1 TABLET BY MOUTH THREE TIMES DAILY    . desvenlafaxine (PRISTIQ) 50 MG 24 hr tablet Take 1 tablet (50 mg total) by mouth daily. 30 tablet 1  . DULoxetine (CYMBALTA) 30 MG capsule duloxetine 30 mg capsule,delayed release  TAKE 1 CAPSULE BY MOUTH TWICE DAILY    . escitalopram (LEXAPRO) 10 MG tablet escitalopram 10 mg tablet  TAKE 1 TABLET BY MOUTH DAILY. WEAN EXISTING FLUOXETINE RX ONCE LEXAPRO STARTED AS FOLLOWS; TAKE 1 CAPSULE NIGHTLY FOR 1 WEEK ON 2ND WEEK TAK    . etodolac (LODINE) 400 MG tablet etodolac 400 mg tablet  TAKE 1 TABLET BY MOUTH TWICE DAILY AS NEEDED WITH FOOD    . lamoTRIgine (LAMICTAL) 25 MG tablet Take 1 tablet daily for 2 weeks then take 2 tablets daily 60 tablet 1  . meloxicam (MOBIC) 7.5 MG tablet Take 1 tablet (7.5 mg total) by mouth in the morning and at bedtime. 60 tablet 3  . SYNTHROID 75 MCG tablet Take 1 tablet (75 mcg total) by mouth daily before breakfast. 30 tablet 2  . tiZANidine (ZANAFLEX) 4 MG tablet Take one half tablet qam, one half tablet in the evening and one whole tablet qhs 60 tablet 5  . traMADol (ULTRAM) 50 MG tablet Take 1 tab p.o. q8hrs prn pain 45 tablet 0   No current facility-administered medications for this visit.      Psychiatric Specialty Exam: Review of Systems  There were no vitals taken for this visit.There is no height or weight on file to calculate BMI.  General  Appearance: Well Groomed  Eye Contact:  Good  Speech:  Clear and Coherent and Normal Rate  Volume:  Normal  Mood:  Euthymic  Affect:  Congruent  Thought Process:  Goal Directed, Linear and Descriptions of Associations: Intact  Orientation:  Full (Time, Place, and Person)  Thought Content: Logical   Suicidal Thoughts:  No  Homicidal Thoughts:  No  Memory:  Recent;   Good Remote;   Good  Judgement:  Good  Insight:  Good  Psychomotor Activity:  Normal  Concentration:  Concentration: Good and Attention Span:  Good  Recall:  Good  Fund of Knowledge: Good  Language: Good  Akathisia:  Negative  Handed:  Right  AIMS (if indicated): not done  Assets:  Communication Skills Desire for Improvement Financial Resources/Insurance Housing  ADL's:  Intact  Cognition: WNL  Sleep:  Good     Screenings: GAD-7     Office Visit from 12/18/2018 in [facility]  Total GAD-7 Score  9    PHQ2-9     Counselor from 12/16/2019 in [facility] PROGRAM Counselor from 11/29/2019 in [facility]PITAL[facility]ATION PROGRAM Office Visit from 12/18/2018 in [facility]  PHQ-2 Total Score  3  6  3   PHQ-9 Total Score  16  24  14        Assessment and Plan: Pt reported that she feels she is doing well on the current combination of Pristiq and Lamictal. She was agreeable to increase the dose of Lamictal to 50 mg Hs for maximum benefit.  1. Bipolar 2 disorder (HCC)  - lamoTRIgine (LAMICTAL) 25 MG tablet; Take 2 tablets daily  Dispense: 60 tablet; Refill: 1 - desvenlafaxine (PRISTIQ) 50 MG 24 hr tablet; Take 1 tablet (50 mg total) by mouth daily.  Dispense: 30 tablet; Refill: 1  2. Social anxiety disorder  - desvenlafaxine (PRISTIQ) 50 MG 24 hr tablet; Take 1 tablet (50 mg total) by mouth daily.  Dispense: 30 tablet; Refill: 1  F/up in 2 months.  , MD 02/26/2020, 1:53 PM

## 2020-02-27 LAB — LIPID PANEL
Cholesterol: 202 mg/dL — ABNORMAL HIGH
HDL: 47 mg/dL — ABNORMAL LOW
LDL Cholesterol (Calc): 137 mg/dL — ABNORMAL HIGH
Non-HDL Cholesterol (Calc): 155 mg/dL — ABNORMAL HIGH
Total CHOL/HDL Ratio: 4.3 (calc)
Triglycerides: 79 mg/dL

## 2020-02-27 LAB — T4, FREE: Free T4: 1.3 ng/dL (ref 0.8–1.8)

## 2020-02-27 LAB — TSH: TSH: 0.27 mIU/L — ABNORMAL LOW

## 2020-03-04 ENCOUNTER — Ambulatory Visit: Payer: Commercial Managed Care - PPO | Admitting: "Endocrinology

## 2020-03-04 ENCOUNTER — Encounter: Payer: Self-pay | Admitting: "Endocrinology

## 2020-03-04 ENCOUNTER — Other Ambulatory Visit: Payer: Self-pay

## 2020-03-04 VITALS — BP 124/83 | HR 67 | Ht 63.0 in | Wt 170.4 lb

## 2020-03-04 DIAGNOSIS — E782 Mixed hyperlipidemia: Secondary | ICD-10-CM | POA: Diagnosis not present

## 2020-03-04 DIAGNOSIS — E063 Autoimmune thyroiditis: Secondary | ICD-10-CM

## 2020-03-04 DIAGNOSIS — E038 Other specified hypothyroidism: Secondary | ICD-10-CM | POA: Diagnosis not present

## 2020-03-04 MED ORDER — ROSUVASTATIN CALCIUM 5 MG PO TABS
5.0000 mg | ORAL_TABLET | Freq: Every day | ORAL | 3 refills | Status: DC
Start: 2020-03-04 — End: 2021-01-27

## 2020-03-04 MED ORDER — SYNTHROID 75 MCG PO TABS: 75.0000 ug | ORAL_TABLET | Freq: Every day | ORAL | 1 refills | Status: DC

## 2020-03-04 NOTE — Progress Notes (Signed)
03/04/2020, 6:30 PM      Endocrinology follow-up note    Mary [name] is a 54 y.o.-year-old female patient who is being seen in follow-up for hypothyroidism and hyperlipidemia.   PMD: Dr. Gerda Diss  Past Medical History:  Diagnosis Date  . Fibromyalgia   . Migraine   . Thyroid disease    Past Surgical History:  Procedure Laterality Date  . TONSILLECTOMY    . TUBAL LIGATION    . uterine ablation     Social History   Socioeconomic History  . Marital status: Married    Spouse name: Not on file  . Number of children: Not on file  . Years of education: Not on file  . Highest education level: Not on file  Occupational History  . Not on file  Tobacco Use  . Smoking status: Former Smoker    Types: Cigarettes  . Smokeless tobacco: Never Used  . Tobacco comment: quit feb 2016  Substance and Sexual Activity  . Alcohol use: No    Alcohol/week: 0.0 standard drinks  . Drug use: No  . Sexual activity: Yes    Birth control/protection: Surgical  Other Topics Concern  . Not on file  Social History Narrative  . Not on file   Social Determinants of Health   Financial Resource Strain:   . Difficulty of Paying Living Expenses:   Food Insecurity:   . Worried About Programme researcher, broadcasting/film/video in the Last Year:   . Barista in the Last Year:   Transportation Needs:   . Freight forwarder (Medical):   Marland Kitchen Lack of Transportation (Non-Medical):   Physical Activity:   . Days of Exercise per Week:   . Minutes of Exercise per Session:   Stress:   . Feeling of Stress :   Social Connections:   . Frequency of Communication with Friends and Family:   . Frequency of Social Gatherings with Friends and Family:   . Attends Religious Services:   . Active Member of Clubs or Organizations:   . Attends Banker Meetings:   Marland Kitchen Marital Status:    Outpatient  Encounter Medications as of 03/04/2020  Medication Sig  . clotrimazole-betamethasone (LOTRISONE) cream clotrimazole-betamethasone 1 %-0.05 % topical cream  APPLY TOPICALLY TO AFFECTED AREA TWICE DAILY IN THE MORNING AND EVENING AS NEEDED.  Marland Kitchen cyclobenzaprine (FLEXERIL) 10 MG tablet cyclobenzaprine 10 mg tablet  TAKE 1 TABLET BY MOUTH THREE TIMES DAILY  . desvenlafaxine (PRISTIQ) 50 MG 24 hr tablet Take 1 tablet (50 mg total) by mouth daily.  . ergocalciferol (VITAMIN D2) 1.25 MG (50000 UT) capsule SMARTSIG:1 Capsule(s) By Mouth Once a Week  . lamoTRIgine (LAMICTAL) 25 MG tablet Take 2 tablets daily  . meloxicam (MOBIC) 7.5 MG tablet Take 1 tablet (7.5 mg total) by mouth in  the morning and at bedtime.  . rosuvastatin (CRESTOR) 5 MG tablet Take 1 tablet (5 mg total) by mouth daily.  Marland Kitchen SYNTHROID 75 MCG tablet Take 1 tablet (75 mcg total) by mouth daily before breakfast.  . tiZANidine (ZANAFLEX) 4 MG tablet Take one half tablet qam, one half tablet in the evening and one whole tablet qhs  . traMADol (ULTRAM) 50 MG tablet Take 1 tab p.o. q8hrs prn pain  . [DISCONTINUED] albuterol (VENTOLIN HFA) 108 (90 Base) MCG/ACT inhaler Inhale 2 puffs po QID prn wheezing (Patient not taking: Reported on 12/04/2019)  . [DISCONTINUED] etodolac (LODINE) 400 MG tablet etodolac 400 mg tablet  TAKE 1 TABLET BY MOUTH TWICE DAILY AS NEEDED WITH FOOD  . [DISCONTINUED] SYNTHROID 75 MCG tablet Take 1 tablet (75 mcg total) by mouth daily before breakfast.   No facility-administered encounter medications on file as of 03/04/2020.   ALLERGIES: Allergies  Allergen Reactions  . Augmentin [Amoxicillin-Pot Clavulanate]     swelling  . Bactrim [Sulfamethoxazole-Trimethoprim]     Lips swelling   VACCINATION STATUS:  There is no immunization history on file for this patient.   HPI    Mary [name]  is a patient with the above medical history. she was diagnosed  with hypothyroidism at approximate age of 69 years with  lab work preceded by suggestive clinical symptoms.  She is currently on levothyroxine 75 mcg p.o. daily before breakfast.  Her previsit labs indicate on target free T4, slightly below target TSH.  She has no symptoms of overtreatment.   -She tolerated Synthroid better than levothyroxine, and would like to continue on Synthroid.  - She has been managed for fibromyalgia.  -She is known to have mild goiter from ultrasound of the thyroid in 2012, her repeat thyroid ultrasound confirms shrinking bilateral thyroid lobes as well as left lobe thyroid nodule shrinking from 2 cm to 1.3 cm.  she reports family history of  thyroid disorders in her mother.  No family history of thyroid cancer.  No history of  radiation therapy to head or neck. -Regarding her significant dyslipidemia, she was advised to pick up prescription for Crestor which she did not.  Her previsit lipid panel showed significant dyslipidemia with LDL of 137.    ROS: Limited as above. Physical Exam: BP 124/83 (BP Location: Right Arm, Patient Position: Sitting)   Pulse 67   Ht 5\' 3"  (1.6 m)   Wt 170 lb 6.4 oz (77.3 kg)   BMI 30.19 kg/m  Wt Readings from Last 3 Encounters:  03/04/20 170 lb 6.4 oz (77.3 kg)  02/12/20 170 lb 6.4 oz (77.3 kg)  09/08/19 170 lb (77.1 kg)     CMP ( most recent) CMP     Component Value Date/Time   NA 142 03/19/2019 1156   NA 140 04/19/2018 0830   K 3.6 03/19/2019 1156   CL 105 03/19/2019 1156   CO2 25 03/19/2019 1156   GLUCOSE 74 03/19/2019 1156   BUN 13 03/19/2019 1156   BUN 17 04/19/2018 0830   CREATININE 0.66 03/19/2019 1156   CALCIUM 9.0 03/19/2019 1156   PROT 6.7 04/19/2018 0830   ALBUMIN 4.5 04/19/2018 0830   AST 16 04/19/2018 0830   ALT 18 04/19/2018 0830   ALKPHOS 79 04/19/2018 0830   BILITOT 0.3 04/19/2018 0830   GFRNONAA >60 03/19/2019 1156   GFRAA >60 03/19/2019 1156     Lipid Panel ( most recent) Lipid Panel     Component Value Date/Time  CHOL 202 (H) 02/27/2020 0742    CHOL 206 (H) 04/19/2018 0830   TRIG 79 02/27/2020 0742   HDL 47 (L) 02/27/2020 0742   HDL 49 04/19/2018 0830   CHOLHDL 4.3 02/27/2020 0742   LDLCALC 137 (H) 02/27/2020 0742       Lab Results  Component Value Date   TSH 0.27 (L) 02/27/2020   TSH 1.37 12/03/2019   TSH 0.94 08/16/2019   TSH 2.260 02/12/2019   TSH 1.370 09/26/2018   TSH 1.310 04/19/2018   TSH 1.150 11/29/2016   TSH 1.620 09/25/2015   TSH 0.964 02/18/2013   FREET4 1.3 02/27/2020   FREET4 1.1 12/03/2019   FREET4 1.0 08/16/2019   FREET4 1.25 02/12/2019   FREET4 1.23 09/26/2018      ASSESSMENT: 1. Hypothyroidism due to Hashimoto's thyroiditis 2.  Hyperlipidemia  PLAN:   -Her previsit TFTs are consistent with slight over replacement.  However she would benefit from her current dose of Synthroid, 75 mcg p.o. daily before breakfast.    - We discussed about the correct intake of her thyroid hormone, on empty stomach at fasting, with water, separated by at least 30 minutes from breakfast and other medications,  and separated by more than 4 hours from calcium, iron, multivitamins, acid reflux medications (PPIs). -Patient is made aware of the fact that thyroid hormone replacement is needed for life, dose to be adjusted by periodic monitoring of thyroid function tests.   Her thyroid ultrasound confirms shrinking bilateral thyroid lobes as well as left-sided thyroid nodule shrinking from 2 cm to 1.3 cm.  No intervention necessary at this time.  For her severe hyperlipidemia, she is now open for prescription for Crestor.  She has family history of coronary artery disease.  She is a former smoker.  After discussing the risk and complications of uncontrolled dyslipidemia, she accepts prescription for Crestor 5 mg p.o. nightly.  Side effects and precautions discussed with her.   She is advised to maintain close follow-up with her PMD Dr. Baltazar Apo.     - Time spent on this patient care encounter:  20 minutes of which  50% was spent in  counseling and the rest reviewing  her current and  previous labs / studies and medications  doses and developing a plan for long term care. [name]  participated in the discussions, expressed understanding, and voiced agreement with the above plans.  All questions were answered to her satisfaction. she is encouraged to contact clinic should she have any questions or concerns prior to her return visit.    Return in about 6 months (around 09/04/2020) for F/U with Pre-visit Labs.  Glade Lloyd, MD [facility] [address] [place], [place] [address] Phone: [phone]  Fax: [phone]   03/04/2020, 6:30 PM  This note was partially dictated with voice recognition software. Similar sounding words can be transcribed inadequately or may not  be corrected upon review.

## 2020-04-04 ENCOUNTER — Other Ambulatory Visit: Payer: Self-pay | Admitting: Family Medicine

## 2020-04-13 ENCOUNTER — Ambulatory Visit: Payer: BC Managed Care – PPO | Admitting: Family Medicine

## 2020-04-23 ENCOUNTER — Other Ambulatory Visit: Payer: Self-pay

## 2020-04-23 ENCOUNTER — Telehealth (HOSPITAL_COMMUNITY): Payer: Self-pay | Admitting: Psychiatry

## 2020-04-23 ENCOUNTER — Telehealth: Payer: Self-pay | Admitting: Psychiatry

## 2020-04-28 ENCOUNTER — Other Ambulatory Visit: Payer: Self-pay | Admitting: *Deleted

## 2020-04-28 ENCOUNTER — Encounter: Payer: Self-pay | Admitting: Family Medicine

## 2020-04-28 ENCOUNTER — Other Ambulatory Visit: Payer: Self-pay | Admitting: Family Medicine

## 2020-04-28 MED ORDER — MAGIC MOUTHWASH: ORAL | 0 refills | Status: DC

## 2020-04-28 MED ORDER — AZITHROMYCIN 250 MG PO TABS: ORAL_TABLET | ORAL | 0 refills | Status: DC

## 2020-04-28 NOTE — Telephone Encounter (Signed)
Nurses  I did look at the patient's picture This shows exudative pharyngitis which can sometimes be associated with viruses other times be associated with strep throat.  Unfortunately based upon the picture alone there is no way I can know for certain what is causing this.  I am fine with sending in Dukes Magic mouthwash 1 teaspoon swish/gargle and spit 4 times daily for the next 5 days as needed after that Recommend 140 mL with 1 refill  As for the exudative pharyngitis although this could be due to a virus, strep throat can sometimes do this as well The most perfect situation would be for the patient to go to a urgent care center to be evaluated and tested for strep  If that is not possible we can send an antibiotic if the patient consents to doing so.  Zithromax-Z-Pak is what I would recommend If the patient does want this please send it in along with the pharmacy notation for throat infection

## 2020-04-29 ENCOUNTER — Telehealth: Payer: Self-pay | Admitting: Family Medicine

## 2020-04-29 NOTE — Telephone Encounter (Signed)
Pt is at [facility] in [facility] and the pharmacist called and needs the formula for the magic mouth wash.

## 2020-04-29 NOTE — Telephone Encounter (Signed)
Pharmacist name is Almira Coaster and the contact telephone number is [phone]

## 2020-04-29 NOTE — Telephone Encounter (Signed)
Please advise. Thank you

## 2020-05-01 NOTE — Telephone Encounter (Signed)
Contacted patient. Pt states that someone reached out and she was able to receive the mouth wash. Pt states she is doing ok. She is taking her antibiotic also. Pt very appreciative that we reached out. Pt states she apologizes about all the back and forth, she did not realize that it was that difficult to get mouthwash. Informed patient that we wanted to make sure that she received what she needed. Pt very appreciated and grateful for [place] reaching back out to her. Also informed patient that if she needed [place] when she got back from the beach, to please give [place] a call.

## 2020-05-01 NOTE — Telephone Encounter (Signed)
Nurses Please connect with patient-this message got lost in the numerous messages I received over the past couple days.  I know I have already sent in her antibiotic for presumed strep throat.  Does she still desire a mouthwash to gargle with or is she doing better and does not want this at this time If she still needs it I can send it in this morning to that pharmacy Apologies for having message get buried

## 2020-05-06 DIAGNOSIS — A6000 Herpesviral infection of urogenital system, unspecified: Secondary | ICD-10-CM

## 2020-05-06 NOTE — Telephone Encounter (Signed)
Who does she normall get this through, looks like dr. Lorin Picket ordered in 3/21 no refills.  Was she getting from gyn previously?  What is she taking it for?  Herpes oral or genital.   Thx,   Dr. Ladona Ridgel

## 2020-05-13 DIAGNOSIS — A6000 Herpesviral infection of urogenital system, unspecified: Secondary | ICD-10-CM | POA: Insufficient documentation

## 2020-05-13 MED ORDER — VALACYCLOVIR HCL 500 MG PO TABS: ORAL_TABLET | ORAL | 0 refills | Status: DC

## 2020-05-13 NOTE — Telephone Encounter (Signed)
Sent. Thx. Dr. Ladona Ridgel

## 2020-05-14 ENCOUNTER — Telehealth (HOSPITAL_COMMUNITY): Payer: No Payment, Other | Admitting: Psychiatry

## 2020-05-14 ENCOUNTER — Other Ambulatory Visit: Payer: Self-pay

## 2020-05-15 ENCOUNTER — Telehealth: Payer: Self-pay | Admitting: Family Medicine

## 2020-05-15 ENCOUNTER — Encounter (HOSPITAL_COMMUNITY): Payer: Self-pay | Admitting: Psychiatry

## 2020-05-15 ENCOUNTER — Telehealth: Payer: Self-pay | Admitting: "Endocrinology

## 2020-05-15 ENCOUNTER — Other Ambulatory Visit: Payer: Self-pay

## 2020-05-15 ENCOUNTER — Telehealth (INDEPENDENT_AMBULATORY_CARE_PROVIDER_SITE_OTHER): Payer: No Payment, Other | Admitting: Psychiatry

## 2020-05-15 ENCOUNTER — Telehealth (HOSPITAL_COMMUNITY): Payer: Self-pay | Admitting: *Deleted

## 2020-05-15 DIAGNOSIS — F4010 Social phobia, unspecified: Secondary | ICD-10-CM

## 2020-05-15 DIAGNOSIS — F3181 Bipolar II disorder: Secondary | ICD-10-CM

## 2020-05-15 MED ORDER — DESVENLAFAXINE SUCCINATE ER 50 MG PO TB24: 50.0000 mg | ORAL_TABLET | Freq: Every day | ORAL | 1 refills | Status: DC

## 2020-05-15 MED ORDER — LAMOTRIGINE 25 MG PO TABS: ORAL_TABLET | ORAL | 1 refills | Status: DC

## 2020-05-15 NOTE — Telephone Encounter (Signed)
Patient called and said she does not think her medication is working. She feels real tired, she said she is sleeping from like 8pm at night to 5 am, just exhausted.. She feels very confused without a lot of memory loss

## 2020-05-15 NOTE — Telephone Encounter (Signed)
Discussed with pt, understanding voiced. 

## 2020-05-15 NOTE — Telephone Encounter (Signed)
Labs put in on 03/04/20 still active for lipid, tsh, free t4 . Do you want to add anything

## 2020-05-15 NOTE — Telephone Encounter (Signed)
Pt made a Appt 9/1 for thyroid check will pat need blood work?

## 2020-05-15 NOTE — Progress Notes (Signed)
BH MD/PA/NP OP Progress Note  Virtual Visit via Telephone Note  I connected with Mary [name] on 05/15/20 at 11:30 AM EDT by telephone and verified that I am speaking with the correct person using two identifiers.  Location: Patient: home Provider: Clinic   I discussed the limitations, risks, security and privacy concerns of performing an evaluation and management service by telephone and the availability of in person appointments. I also discussed with the patient that there may be a patient responsible charge related to this service. The patient expressed understanding and agreed to proceed.   I provided 16 minutes of non-face-to-face time during this encounter.    05/15/2020 11:39 AM Mary [name]  MRN:  [id number]  Chief Complaint:  " I feel I am becoming more forgetful lately."  HPI: Pt is becoming increasingly forgetful lately.  She also reported that she has generalized fatigue and does not feel like her energy levels are as good as they used to be.  She stated that last night she went to bed 7 p.m. and woke up at 5 in the morning today. Patient has history of Hashimoto's thyroiditis and has been taking Synthroid 75 mcg regularly for that.  Patient was advised to touch base with her endocrinologist regarding the excessive fatigue and memory issues to see if her thyroid condition is playing a role in that. Patient was explained that the dose of Lamictal and Pristiq that she is taking are generally not associated with any impairment of memory. Patient verbalized understanding and agreed to contact her endocrinologist urgently.   Visit Diagnosis:    ICD-10-CM   1. Bipolar 2 disorder (HCC)  F31.81   2. Social anxiety disorder  F40.10     Past Psychiatric History: Depression, anxiety  Past Medical History:  Past Medical History:  Diagnosis Date  . Fibromyalgia   . Migraine   . Thyroid disease     Past Surgical History:  Procedure Laterality Date  .  TONSILLECTOMY    . TUBAL LIGATION    . uterine ablation      Family Psychiatric History: Maternal grandmother and mother depression/anxiety. Paternal uncle committed suicide. Patient's daughter 75 years old-reported struggling with substance abuse and was recently released from jail.   Family History:  Family History  Problem Relation Age of Onset  . Heart disease Mother   . Depression Mother   . Heart disease Father   . Depression Father   . Depression Maternal Uncle     Social History:  Social History   Socioeconomic History  . Marital status: Married    Spouse name: Not on file  . Number of children: Not on file  . Years of education: Not on file  . Highest education level: Not on file  Occupational History  . Not on file  Tobacco Use  . Smoking status: Former Smoker    Types: Cigarettes  . Smokeless tobacco: Never Used  . Tobacco comment: quit feb 2016  Substance and Sexual Activity  . Alcohol use: No    Alcohol/week: 0.0 standard drinks  . Drug use: No  . Sexual activity: Yes    Birth control/protection: Surgical  Other Topics Concern  . Not on file  Social History Narrative  . Not on file   Social Determinants of Health   Financial Resource Strain:   . Difficulty of Paying Living Expenses:   Food Insecurity:   . Worried About Programme researcher, broadcasting/film/video in the Last Year:   . Ran  Out of Food in the Last Year:   Transportation Needs:   . Lack of Transportation (Medical):   Marland Kitchen Lack of Transportation (Non-Medical):   Physical Activity:   . Days of Exercise per Week:   . Minutes of Exercise per Session:   Stress:   . Feeling of Stress :   Social Connections:   . Frequency of Communication with Friends and Family:   . Frequency of Social Gatherings with Friends and Family:   . Attends Religious Services:   . Active Member of Clubs or Organizations:   . Attends Banker Meetings:   Marland Kitchen Marital Status:     Allergies:  Allergies  Allergen  Reactions  . Augmentin [Amoxicillin-Pot Clavulanate]     swelling  . Bactrim [Sulfamethoxazole-Trimethoprim]     Lips swelling    Metabolic Disorder Labs: Lab Results  Component Value Date   HGBA1C 5.6 08/16/2019   MPG 114 08/16/2019   No results found for: PROLACTIN Lab Results  Component Value Date   CHOL 202 (H) 02/27/2020   TRIG 79 02/27/2020   HDL 47 (L) 02/27/2020   CHOLHDL 4.3 02/27/2020   LDLCALC 137 (H) 02/27/2020   LDLCALC 154 (H) 08/16/2019   Lab Results  Component Value Date   TSH 0.27 (L) 02/27/2020   TSH 1.37 12/03/2019    Therapeutic Level Labs: No results found for: LITHIUM No results found for: VALPROATE No components found for:  CBMZ  Current Medications: Current Outpatient Medications  Medication Sig Dispense Refill  . azithromycin (ZITHROMAX Z-PAK) 250 MG tablet Take 2 tablets (500 mg) on  Day 1,  followed by 1 tablet (250 mg) once daily on Days 2 through 5. 6 each 0  . clotrimazole-betamethasone (LOTRISONE) cream clotrimazole-betamethasone 1 %-0.05 % topical cream  APPLY TOPICALLY TO AFFECTED AREA TWICE DAILY IN THE MORNING AND EVENING AS NEEDED.    Marland Kitchen cyclobenzaprine (FLEXERIL) 10 MG tablet cyclobenzaprine 10 mg tablet  TAKE 1 TABLET BY MOUTH THREE TIMES DAILY    . desvenlafaxine (PRISTIQ) 50 MG 24 hr tablet Take 1 tablet (50 mg total) by mouth daily. 30 tablet 1  . ergocalciferol (VITAMIN D2) 1.25 MG (50000 UT) capsule SMARTSIG:1 Capsule(s) By Mouth Once a Week    . lamoTRIgine (LAMICTAL) 25 MG tablet Take 2 tablets daily 60 tablet 1  . magic mouthwash SOLN 1 teaspoon swish/gargle and spit 4 x per day for 5 days then PRN. 140 mL 0  . meloxicam (MOBIC) 7.5 MG tablet Take 1 tablet (7.5 mg total) by mouth in the morning and at bedtime. 60 tablet 3  . rosuvastatin (CRESTOR) 5 MG tablet Take 1 tablet (5 mg total) by mouth daily. 30 tablet 3  . SYNTHROID 75 MCG tablet Take 1 tablet (75 mcg total) by mouth daily before breakfast. 90 tablet 1  .  tiZANidine (ZANAFLEX) 4 MG tablet TAKE 1/2 (ONE-HALF) TABLET BY MOUTH IN THE MORNING,  1/2 (ONE-HALF) TABLET IN THE EVENING AND 1 WHOLE TABLET AT BEDTIME. 60 tablet 0  . traMADol (ULTRAM) 50 MG tablet Take 1 tab p.o. q8hrs prn pain 45 tablet 0  . valACYclovir (VALTREX) 500 MG tablet Take 1 tab p.o. bid for 3 days at onset of outbreak. 24 tablet 0   No current facility-administered medications for this visit.      Psychiatric Specialty Exam: Review of Systems  There were no vitals taken for this visit.There is no height or weight on file to calculate BMI.  General Appearance: unable to assess  due to phone visit  Eye Contact:  unable to assess due to phone visit  Speech:  Clear and Coherent and Normal Rate  Volume:  Normal  Mood:  Euthymic  Affect:  Congruent  Thought Process:  Goal Directed, Linear and Descriptions of Associations: Intact  Orientation:  Full (Time, Place, and Person)  Thought Content: Logical   Suicidal Thoughts:  No  Homicidal Thoughts:  No  Memory:  Recent;   Good Remote;   Good  Judgement:  Good  Insight:  Good  Psychomotor Activity:  Normal  Concentration:  Concentration: Good and Attention Span: Good  Recall:  Good  Fund of Knowledge: Good  Language: Good  Akathisia:  Negative  Handed:  Right  AIMS (if indicated): not done  Assets:  Communication Skills Desire for Improvement Financial Resources/Insurance Housing  ADL's:  Intact  Cognition: WNL  Sleep:  Good     Screenings: GAD-7     Office Visit from 12/18/2018 in [facility]  Total GAD-7 Score 9    PHQ2-9     Counselor from 12/16/2019 in BEHAVIORAL HEALTH PARTIAL HOSPITALIZATION PROGRAM Counselor from 11/29/2019 in BEHAVIORAL HEALTH PARTIAL HOSPITALIZATION PROGRAM Office Visit from 12/18/2018 in [facility]  PHQ-2 Total Score 3 6 3   PHQ-9 Total Score 16 24 14        Assessment and Plan: Patient reported that she forgot her appointment yesterday and complained of  being increasingly forgetful lately.  She also complained of excessive fatigue.  She asked if her medications could be contributing to that and was informed that the dose that she is on generally are not associated with with these effects.  She was advised to communicate with her endocrinologist as she has history of hypothyroidism and is currently on Synthroid.  1. Bipolar 2 disorder (HCC)  - lamoTRIgine (LAMICTAL) 25 MG tablet; Take 2 tablets daily  Dispense: 60 tablet; Refill: 1 - desvenlafaxine (PRISTIQ) 50 MG 24 hr tablet; Take 1 tablet (50 mg total) by mouth daily.  Dispense: 30 tablet; Refill: 1  2. Social anxiety disorder  - desvenlafaxine (PRISTIQ) 50 MG 24 hr tablet; Take 1 tablet (50 mg total) by mouth daily.  Dispense: 30 tablet; Refill: 1  Continue same regimen for now. Pt was advised to contact her endocrinologist for recommendations regarding excessive fatigue and memory issues. F/up in 2 months.  , MD 05/15/2020, 11:39 AM

## 2020-05-15 NOTE — Telephone Encounter (Signed)
Call from Selena Batten this am concerned her memory seems to be effected possibly by one of her medicines. States she has missed the last two appts with her Dr, which is Dr Evelene Croon, and at work she is always asking where her phone and other things which she feels is not her. She is rescheduling an appt with the Dr now, which will possibly be some time in the distant future with the current wait times. Will inform Dr of patients concern and told Selena Batten one of [place] would call her back if there were recommendations.

## 2020-05-15 NOTE — Telephone Encounter (Signed)
We can only tell by lab work. She can do her TSH, Free T4 sooner to see if she needs dose adjustment.

## 2020-05-16 NOTE — Telephone Encounter (Signed)
Yes, pls keep those labs and add cbc, cmp. Thx. Dr. Ladona Ridgel

## 2020-05-18 ENCOUNTER — Other Ambulatory Visit: Payer: Self-pay | Admitting: *Deleted

## 2020-05-18 DIAGNOSIS — E039 Hypothyroidism, unspecified: Secondary | ICD-10-CM

## 2020-05-18 DIAGNOSIS — Z13220 Encounter for screening for lipoid disorders: Secondary | ICD-10-CM

## 2020-05-18 DIAGNOSIS — R5383 Other fatigue: Secondary | ICD-10-CM

## 2020-05-18 NOTE — Telephone Encounter (Signed)
Labs ordered, patient notified

## 2020-06-02 ENCOUNTER — Telehealth (HOSPITAL_COMMUNITY): Payer: No Payment, Other | Admitting: Psychiatry

## 2020-06-24 ENCOUNTER — Ambulatory Visit: Payer: Commercial Managed Care - PPO | Admitting: Family Medicine

## 2020-07-03 ENCOUNTER — Other Ambulatory Visit (HOSPITAL_COMMUNITY): Payer: Self-pay | Admitting: Psychiatry

## 2020-07-03 DIAGNOSIS — F3181 Bipolar II disorder: Secondary | ICD-10-CM

## 2020-07-03 DIAGNOSIS — F4010 Social phobia, unspecified: Secondary | ICD-10-CM

## 2020-07-13 ENCOUNTER — Encounter (HOSPITAL_COMMUNITY): Payer: Self-pay | Admitting: Psychiatry

## 2020-07-13 ENCOUNTER — Telehealth (INDEPENDENT_AMBULATORY_CARE_PROVIDER_SITE_OTHER): Payer: No Payment, Other | Admitting: Psychiatry

## 2020-07-13 ENCOUNTER — Other Ambulatory Visit: Payer: Self-pay

## 2020-07-13 DIAGNOSIS — F3181 Bipolar II disorder: Secondary | ICD-10-CM

## 2020-07-13 DIAGNOSIS — F4010 Social phobia, unspecified: Secondary | ICD-10-CM

## 2020-07-13 MED ORDER — DESVENLAFAXINE SUCCINATE ER 50 MG PO TB24: 50.0000 mg | ORAL_TABLET | Freq: Every day | ORAL | 0 refills | Status: DC

## 2020-07-13 MED ORDER — LAMOTRIGINE 25 MG PO TABS: ORAL_TABLET | ORAL | 1 refills | Status: DC
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
## 2020-07-27 ENCOUNTER — Ambulatory Visit (INDEPENDENT_AMBULATORY_CARE_PROVIDER_SITE_OTHER): Payer: Self-pay | Admitting: Family Medicine

## 2020-07-27 ENCOUNTER — Encounter: Payer: Self-pay | Admitting: Family Medicine

## 2020-07-27 ENCOUNTER — Other Ambulatory Visit: Payer: Self-pay

## 2020-07-27 DIAGNOSIS — H6501 Acute serous otitis media, right ear: Secondary | ICD-10-CM

## 2020-07-27 MED ORDER — AZITHROMYCIN 250 MG PO TABS
ORAL_TABLET | ORAL | 0 refills | Status: DC
Start: 2020-07-27 — End: 2020-09-09

## 2020-07-27 NOTE — Telephone Encounter (Signed)
Called pt but she states she already called in and made appt for 3 today

## 2020-07-27 NOTE — Progress Notes (Signed)
Patient ID: Mary [name], female    DOB: 1966-06-30, 54 y.o.   MRN: [id number]   Chief Complaint  Patient presents with  . Ear Pain    swollen glands   Subjective:    HPI Pt seen as tent visit. Having bilateral ear pain, worse on rt.  Started yesterday. Having some swollen lymph nodes in neck, and achiness in back of neck. No fever, coughing, sob, sore throat or runny nose. Has some congestion on left side of nares.  No loss of taste or smell.  No meds. Sick contact- grand-dauther with croup this week. 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   Medical History Mary [name] has a past medical history of Fibromyalgia, Migraine, and Thyroid disease.   Outpatient Encounter Medications as of 07/27/2020  Medication Sig  . azithromycin (ZITHROMAX) 250 MG tablet Take 2 tab p.o. day 1, then take 1 tab for 4 more days.  . clotrimazole-betamethasone (LOTRISONE) cream clotrimazole-betamethasone 1 %-0.05 % topical cream  APPLY TOPICALLY TO AFFECTED AREA TWICE DAILY IN THE MORNING AND EVENING AS NEEDED.  Marland Kitchen cyclobenzaprine (FLEXERIL) 10 MG tablet cyclobenzaprine 10 mg tablet  TAKE 1 TABLET BY MOUTH THREE TIMES DAILY  . desvenlafaxine (PRISTIQ) 50 MG 24 hr tablet Take 1 tablet (50 mg total) by mouth daily.  . ergocalciferol (VITAMIN D2) 1.25 MG (50000 UT) capsule SMARTSIG:1 Capsule(s) By Mouth Once a Week  . lamoTRIgine (LAMICTAL) 25 MG tablet Take 3 tablets daily for 2 weeks then take 4 tablets daily  . magic mouthwash SOLN 1 teaspoon swish/gargle and spit 4 x per day for 5 days then PRN.  . meloxicam (MOBIC) 7.5 MG tablet Take 1 tablet (7.5 mg total) by mouth in the morning and at bedtime.  . rosuvastatin (CRESTOR) 5 MG tablet Take 1 tablet (5 mg total) by mouth daily.  Marland Kitchen SYNTHROID 75 MCG tablet Take 1 tablet (75 mcg total) by mouth daily before breakfast.  . tiZANidine (ZANAFLEX) 4 MG tablet TAKE 1/2 (ONE-HALF) TABLET BY MOUTH IN THE MORNING,  1/2 (ONE-HALF) TABLET IN  THE EVENING AND 1 WHOLE TABLET AT BEDTIME.  . valACYclovir (VALTREX) 500 MG tablet Take 1 tab p.o. bid for 3 days at onset of outbreak.   No facility-administered encounter medications on file as of 07/27/2020.     Review of Systems  Constitutional: Negative for chills and fever.  HENT: Positive for congestion and ear pain. Negative for ear discharge, postnasal drip, rhinorrhea, sinus pressure, sinus pain, sneezing and sore throat.   Eyes: Negative for pain, discharge and itching.  Respiratory: Negative for cough and wheezing.   Gastrointestinal: Negative for constipation, diarrhea, nausea and vomiting.     Vitals  VS- 98.68F, 71bpm, 97% ox sat.  Objective:   Physical Exam Vitals and nursing note reviewed.  Constitutional:      General: She is not in acute distress.    Appearance: Normal appearance. She is not ill-appearing.  HENT:     Head: Normocephalic and atraumatic.     Right Ear: Ear canal and external ear normal. There is no impacted cerumen.     Left Ear: Tympanic membrane, ear canal and external ear normal. There is no impacted cerumen.     Ears:     Comments: +clear-cloudy effusion on Rt TM.  No injection, or erythema. No bulging.    Nose: Nose normal. No congestion or rhinorrhea.     Comments: No sinus pain over frontal or max sinuses.    Mouth/Throat:  Mouth: Mucous membranes are moist.     Pharynx: Oropharynx is clear. No oropharyngeal exudate or posterior oropharyngeal erythema.  Eyes:     Extraocular Movements: Extraocular movements intact.     Conjunctiva/sclera: Conjunctivae normal.     Pupils: Pupils are equal, round, and reactive to light.  Cardiovascular:     Pulses: Normal pulses.     Heart sounds: Normal heart sounds. No murmur heard.   Pulmonary:     Effort: Pulmonary effort is normal. No respiratory distress.     Breath sounds: No wheezing, rhonchi or rales.  Musculoskeletal:     Cervical back: Normal range of motion. No tenderness.    Lymphadenopathy:     Cervical: No cervical adenopathy.  Skin:    General: Skin is warm and dry.     Findings: No rash.  Neurological:     General: No focal deficit present.     Mental Status: She is alert and oriented to person, place, and time.  Psychiatric:        Mood and Affect: Mood normal.        Behavior: Behavior normal.      Assessment and Plan   1. Non-recurrent acute serous otitis media of right ear   Gave watch and wait script of azithromycin, if worsening she can start.  Recommending for serous effusion and likely viral illness- flonase and sudafed or allegra D. Fluids, tylenol/ibuprofen prn.  If not improving call or rto.   F/u prn.

## 2020-08-11 ENCOUNTER — Telehealth: Payer: Self-pay

## 2020-08-11 NOTE — Telephone Encounter (Signed)
pt called and left a message on my voicemail that she wanted to know if a prior auth could be done on her pristiq she can not afford to keep paing $45

## 2020-08-11 NOTE — Telephone Encounter (Signed)
pt called and left a message on my voicemail that she wanted to know if a prior auth could be done on her pristiq she can not afford to keep paing $45  

## 2020-08-11 NOTE — Telephone Encounter (Signed)
I spoke with patient's pharmacy and then called the patient regarding her Pristiq 50mg . The patient didn't pick up when I called so I left her this VM message. I spoke with the pharmacy and they informed me that her Pristiq is covered by her insurance and she has a $50 copay. However, the pharmacist used a Good RX card which brought it down to $34. I informed patient that since she has insurance that covers her Prisiq, we cannot do a prior authorization (or patient assistance) on her medication

## 2020-08-26 ENCOUNTER — Other Ambulatory Visit (HOSPITAL_COMMUNITY): Payer: Self-pay | Admitting: Psychiatry

## 2020-08-26 DIAGNOSIS — F3181 Bipolar II disorder: Secondary | ICD-10-CM

## 2020-08-27 ENCOUNTER — Other Ambulatory Visit: Payer: Self-pay

## 2020-08-27 ENCOUNTER — Encounter (HOSPITAL_COMMUNITY): Payer: Self-pay | Admitting: Psychiatry

## 2020-08-27 ENCOUNTER — Telehealth (INDEPENDENT_AMBULATORY_CARE_PROVIDER_SITE_OTHER): Payer: No Payment, Other | Admitting: Psychiatry

## 2020-08-27 DIAGNOSIS — F4010 Social phobia, unspecified: Secondary | ICD-10-CM

## 2020-08-27 DIAGNOSIS — F3181 Bipolar II disorder: Secondary | ICD-10-CM | POA: Diagnosis not present

## 2020-08-27 MED ORDER — LAMOTRIGINE 25 MG PO TABS: ORAL_TABLET | ORAL | 0 refills | Status: DC

## 2020-08-27 MED ORDER — DESVENLAFAXINE SUCCINATE ER 50 MG PO TB24: 50.0000 mg | ORAL_TABLET | Freq: Every day | ORAL | 0 refills | Status: DC

## 2020-08-27 NOTE — Progress Notes (Signed)
[facility] MD/PA/NP OP Progress Note  Virtual Visit via Telephone Note  I connected with Mary [name] on 08/27/20 at 11:40 AM EDT by telephone and verified that I am speaking with the correct person using two identifiers.  Location: Patient: home Provider: Clinic   I discussed the limitations, risks, security and privacy concerns of performing an evaluation and management service by telephone and the availability of in person appointments. I also discussed with the patient that there may be a patient responsible charge related to this service. The patient expressed understanding and agreed to proceed.   I provided 16 minutes of non-face-to-face time during this encounter.    08/27/2020 11:44 AM Mary [name]  MRN:  [id number]  Chief Complaint:  "I feel too laid back."  HPI: 54 year old female patient contacted today for follow-up psychiatric evaluation for medication management.  Patient has a history of bipolar 2 disorder and social anxiety disorder.  Patient is currently managed on Pristiq 50 mg daily for anxiety and lamictal 100 mg daily for mood.    Patient endorses significant improvement in her mood since her last visit.  She reports decreased irritability and improved sleep.  However, she reports that she feels too laid back since her Lamictal dosage was increased from 75 mg to 100 mg at her last visit. She endorses feelings of apathy. She reports that she felt more leveled on 75 mg of Lamictal.  She would like to request for her Lamictal dosage to be decreased back down to 75 mg daily at this time.  Provider will reduce Lamictal dose to 75 mg daily.  Patient inquired if she preferred the 100 mg dosage later, could she restart the increased dosage.  Provider instructed the patient to try taking the 75 mg dosage for 2 weeks and then if she feels that she prefers the 100 mg dosage, notify the provider and restart the 100 mg dosage until her next visit.  Patient is agreeable with this  plan of care at this time and would like to continue all other medications as prescribed.  Patient also notes improvement in her relationship with her husband since her last visit.  Patient denies any other questions or concerns at this time.  Visit Diagnosis:    ICD-10-CM   1. Bipolar 2 disorder (HCC)  F31.81 lamoTRIgine (LAMICTAL) 25 MG tablet    desvenlafaxine (PRISTIQ) 50 MG 24 hr tablet  2. Social anxiety disorder  F40.10 desvenlafaxine (PRISTIQ) 50 MG 24 hr tablet    Past Psychiatric History: Depression, anxiety  Past Medical History:  Past Medical History:  Diagnosis Date  . Fibromyalgia   . Migraine   . Thyroid disease     Past Surgical History:  Procedure Laterality Date  . TONSILLECTOMY    . TUBAL LIGATION    . uterine ablation      Family Psychiatric History: Maternal grandmother and mother depression/anxiety. Paternal uncle committed suicide. Patient's daughter 24 years old-reported struggling with substance abuse and was recently released from jail.   Family History:  Family History  Problem Relation Age of Onset  . Heart disease Mother   . Depression Mother   . Heart disease Father   . Depression Father   . Depression Maternal Uncle     Social History:  Social History   Socioeconomic History  . Marital status: Married    Spouse name: Not on file  . Number of children: Not on file  . Years of education: Not on file  . Highest  education level: Not on file  Occupational History  . Not on file  Tobacco Use  . Smoking status: Former Smoker    Types: Cigarettes  . Smokeless tobacco: Never Used  . Tobacco comment: quit feb 2016  Substance and Sexual Activity  . Alcohol use: No    Alcohol/week: 0.0 standard drinks  . Drug use: No  . Sexual activity: Yes    Birth control/protection: Surgical  Other Topics Concern  . Not on file  Social History Narrative  . Not on file   Social Determinants of Health   Financial Resource Strain:   .  Difficulty of Paying Living Expenses: Not on file  Food Insecurity:   . Worried About Programme researcher, broadcasting/film/video in the Last Year: Not on file  . Ran Out of Food in the Last Year: Not on file  Transportation Needs:   . Lack of Transportation (Medical): Not on file  . Lack of Transportation (Non-Medical): Not on file  Physical Activity:   . Days of Exercise per Week: Not on file  . Minutes of Exercise per Session: Not on file  Stress:   . Feeling of Stress : Not on file  Social Connections:   . Frequency of Communication with Friends and Family: Not on file  . Frequency of Social Gatherings with Friends and Family: Not on file  . Attends Religious Services: Not on file  . Active Member of Clubs or Organizations: Not on file  . Attends Banker Meetings: Not on file  . Marital Status: Not on file    Allergies:  Allergies  Allergen Reactions  . Augmentin [Amoxicillin-Pot Clavulanate]     swelling  . Bactrim [Sulfamethoxazole-Trimethoprim]     Lips swelling    Metabolic Disorder Labs: Lab Results  Component Value Date   HGBA1C 5.6 08/16/2019   MPG 114 08/16/2019   No results found for: PROLACTIN Lab Results  Component Value Date   CHOL 202 (H) 02/27/2020   TRIG 79 02/27/2020   HDL 47 (L) 02/27/2020   CHOLHDL 4.3 02/27/2020   LDLCALC 137 (H) 02/27/2020   LDLCALC 154 (H) 08/16/2019   Lab Results  Component Value Date   TSH 0.27 (L) 02/27/2020   TSH 1.37 12/03/2019    Therapeutic Level Labs: No results found for: LITHIUM No results found for: VALPROATE No components found for:  CBMZ  Current Medications: Current Outpatient Medications  Medication Sig Dispense Refill  . azithromycin (ZITHROMAX) 250 MG tablet Take 2 tab p.o. day 1, then take 1 tab for 4 more days. 6 tablet 0  . clotrimazole-betamethasone (LOTRISONE) cream clotrimazole-betamethasone 1 %-0.05 % topical cream  APPLY TOPICALLY TO AFFECTED AREA TWICE DAILY IN THE MORNING AND EVENING AS NEEDED.     Marland Kitchen cyclobenzaprine (FLEXERIL) 10 MG tablet cyclobenzaprine 10 mg tablet  TAKE 1 TABLET BY MOUTH THREE TIMES DAILY    . desvenlafaxine (PRISTIQ) 50 MG 24 hr tablet Take 1 tablet (50 mg total) by mouth daily. 90 tablet 0  . ergocalciferol (VITAMIN D2) 1.25 MG (50000 UT) capsule SMARTSIG:1 Capsule(s) By Mouth Once a Week    . lamoTRIgine (LAMICTAL) 25 MG tablet Take 3 tablets daily (75 mg) 270 tablet 0  . magic mouthwash SOLN 1 teaspoon swish/gargle and spit 4 x per day for 5 days then PRN. 140 mL 0  . meloxicam (MOBIC) 7.5 MG tablet Take 1 tablet (7.5 mg total) by mouth in the morning and at bedtime. 60 tablet 3  . rosuvastatin (CRESTOR)  5 MG tablet Take 1 tablet (5 mg total) by mouth daily. 30 tablet 3  . SYNTHROID 75 MCG tablet Take 1 tablet (75 mcg total) by mouth daily before breakfast. 90 tablet 1  . tiZANidine (ZANAFLEX) 4 MG tablet TAKE 1/2 (ONE-HALF) TABLET BY MOUTH IN THE MORNING,  1/2 (ONE-HALF) TABLET IN THE EVENING AND 1 WHOLE TABLET AT BEDTIME. 60 tablet 0  . valACYclovir (VALTREX) 500 MG tablet Take 1 tab p.o. bid for 3 days at onset of outbreak. 24 tablet 0   No current facility-administered medications for this visit.      Psychiatric Specialty Exam: Review of Systems  There were no vitals taken for this visit.There is no height or weight on file to calculate BMI.  General Appearance: unable to assess due to phone visit  Eye Contact:  unable to assess due to phone visit  Speech:  Clear and Coherent and Normal Rate  Volume:  Normal  Mood:  Euthymic  Affect:  Congruent  Thought Process:  Goal Directed, Linear and Descriptions of Associations: Intact  Orientation:  Full (Time, Place, and Person)  Thought Content: Logical   Suicidal Thoughts:  No  Homicidal Thoughts:  No  Memory:  Recent;   Good Remote;   Good  Judgement:  Good  Insight:  Good  Psychomotor Activity:  Normal  Concentration:  Concentration: Good and Attention Span: Good  Recall:  Good  Fund of  Knowledge: Good  Language: Good  Akathisia:  Negative  Handed:  Right  AIMS (if indicated): not done  Assets:  Communication Skills Desire for Improvement Financial Resources/Insurance Housing  ADL's:  Intact  Cognition: WNL  Sleep:  Good     Screenings: GAD-7     Office Visit from 12/18/2018 in [facility]  Total GAD-7 Score 9    PHQ2-9     Counselor from 12/16/2019 in [facility]ITAL[facility]ATION PROGRAM Counselor from 11/29/2019 in [facility]HAVIORAL [facility] PART[facility]OSPITALIZATION PROGRAM Office Visit from 12/18/2018 in [facility]  PHQ-2 Total Score 3 6 3   PHQ-9 Total Score 16 24 14        Assessment and Plan: Patient endorses improved irritability and mood since starting increased Lamictal dose after last visit, but now reports some feelings of apathy. We will readjust the dose of Lamictal to reduce apathetic symptoms and maintain optimal control of irritability.  Patient is agreeable with this plan and would like to continue Pristiq as prescribed.  Potential side effects of all medications and risks vs benefits of treatment vs non-treatment were explained and discussed. All questions were answered.  1. Bipolar 2 disorder (HCC)  Decrease - lamoTRIgine (LAMICTAL) 25 MG tablet; Take 3 tablets daily (75 mg)  Dispense: 270 tablet; Refill: 0 Continue - desvenlafaxine (PRISTIQ) 50 MG 24 hr tablet; Take 1 tablet (50 mg total) by mouth daily.  Dispense: 90 tablet; Refill: 0  2. Social anxiety disorder  - desvenlafaxine (PRISTIQ) 50 MG 24 hr tablet; Take 1 tablet (50 mg total) by mouth daily.  Dispense: 90 tablet; Refill: 0   F/up in 3 months.  , MD 08/27/2020, 11:44 AM

## 2020-09-07 ENCOUNTER — Telehealth: Payer: Self-pay

## 2020-09-07 NOTE — Telephone Encounter (Signed)
Pt canceled all future appts and does not want to reschedule

## 2020-09-08 ENCOUNTER — Ambulatory Visit: Payer: Self-pay | Admitting: Nurse Practitioner

## 2020-09-08 ENCOUNTER — Ambulatory Visit: Payer: Self-pay

## 2020-09-09 ENCOUNTER — Encounter: Payer: Self-pay | Admitting: Family Medicine

## 2020-09-09 ENCOUNTER — Ambulatory Visit (INDEPENDENT_AMBULATORY_CARE_PROVIDER_SITE_OTHER): Payer: Self-pay | Admitting: Family Medicine

## 2020-09-09 ENCOUNTER — Other Ambulatory Visit: Payer: Self-pay

## 2020-09-09 DIAGNOSIS — J309 Allergic rhinitis, unspecified: Secondary | ICD-10-CM

## 2020-09-09 DIAGNOSIS — H6503 Acute serous otitis media, bilateral: Secondary | ICD-10-CM

## 2020-09-09 MED ORDER — AZITHROMYCIN 250 MG PO TABS: ORAL_TABLET | ORAL | 0 refills | Status: DC

## 2020-09-09 MED ORDER — FLUTICASONE PROPIONATE 50 MCG/ACT NA SUSP: 2.0000 | Freq: Every day | NASAL | 1 refills | Status: DC

## 2020-09-09 NOTE — Progress Notes (Signed)
Patient ID: Mary [name], female    DOB: 01-Aug-1966, 54 y.o.   MRN: [id number]   Chief Complaint  Patient presents with  . Sinusitis   Subjective:    HPI Pt having upper sinus pressure, right ear pain, hoarsness (resolved some), eyes watery and itching, tightness in head/headache. Began on 3 days ago. Has been taking Alka Seltzer.   Pt having pounding HA and pressure in temples and under eyes.  Stuffy nose.  No nasal discharge. Has ear pain on right. Has had ear infection this year and treated with azithromycin Was around grand daughter last few days and had URI symptoms and dx with ear infection. No fever, no known covid contacts. Has been out of work for 2 days. Not had covid vaccines.   Medical History Kayla has a past medical history of Fibromyalgia, Migraine, and Thyroid disease.   Outpatient Encounter Medications as of 09/09/2020  Medication Sig  . clotrimazole-betamethasone (LOTRISONE) cream clotrimazole-betamethasone 1 %-0.05 % topical cream  APPLY TOPICALLY TO AFFECTED AREA TWICE DAILY IN THE MORNING AND EVENING AS NEEDED.  Marland Kitchen cyclobenzaprine (FLEXERIL) 10 MG tablet cyclobenzaprine 10 mg tablet  TAKE 1 TABLET BY MOUTH THREE TIMES DAILY  . desvenlafaxine (PRISTIQ) 50 MG 24 hr tablet Take 1 tablet (50 mg total) by mouth daily.  . ergocalciferol (VITAMIN D2) 1.25 MG (50000 UT) capsule SMARTSIG:1 Capsule(s) By Mouth Once a Week  . lamoTRIgine (LAMICTAL) 25 MG tablet Take 3 tablets daily (75 mg)  . magic mouthwash SOLN 1 teaspoon swish/gargle and spit 4 x per day for 5 days then PRN.  . meloxicam (MOBIC) 7.5 MG tablet Take 1 tablet (7.5 mg total) by mouth in the morning and at bedtime.  . rosuvastatin (CRESTOR) 5 MG tablet Take 1 tablet (5 mg total) by mouth daily.  Marland Kitchen SYNTHROID 75 MCG tablet Take 1 tablet (75 mcg total) by mouth daily before breakfast.  . tiZANidine (ZANAFLEX) 4 MG tablet TAKE 1/2 (ONE-HALF) TABLET BY MOUTH IN THE MORNING,  1/2 (ONE-HALF)  TABLET IN THE EVENING AND 1 WHOLE TABLET AT BEDTIME.  . valACYclovir (VALTREX) 500 MG tablet Take 1 tab p.o. bid for 3 days at onset of outbreak.  Marland Kitchen azithromycin (ZITHROMAX) 250 MG tablet Take 2 tab p.o. on day 1, then take 1 tab for 4 more days.  . fluticasone (FLONASE) 50 MCG/ACT nasal spray Place 2 sprays into both nostrils daily.  . [DISCONTINUED] azithromycin (ZITHROMAX) 250 MG tablet Take 2 tab p.o. day 1, then take 1 tab for 4 more days.   No facility-administered encounter medications on file as of 09/09/2020.     Review of Systems  Constitutional: Negative for chills and fever.  HENT: Positive for congestion, ear pain (right), sinus pressure, sinus pain and voice change. Negative for rhinorrhea and sore throat.   Eyes: Positive for discharge and itching. Negative for pain.  Respiratory: Negative for cough.   Gastrointestinal: Negative for constipation, diarrhea, nausea and vomiting.  Neurological: Positive for headaches.     Vitals 98.86F, 99% RA, HR 80 bpm, RR- 16.  Objective:   Physical Exam Vitals and nursing note reviewed.  Constitutional:      General: She is not in acute distress.    Appearance: Normal appearance. She is not toxic-appearing.  HENT:     Head: Normocephalic and atraumatic.     Right Ear: Ear canal and external ear normal.     Left Ear: Ear canal and external ear normal.  Ears:     Comments: +bilateral erythema and injection to TMs, bilateral serous effusion.    Nose: Nose normal. No congestion or rhinorrhea.     Mouth/Throat:     Mouth: Mucous membranes are moist.     Pharynx: Oropharynx is clear. No oropharyngeal exudate or posterior oropharyngeal erythema.  Eyes:     Extraocular Movements: Extraocular movements intact.     Conjunctiva/sclera: Conjunctivae normal.     Pupils: Pupils are equal, round, and reactive to light.  Pulmonary:     Effort: Pulmonary effort is normal. No respiratory distress.  Musculoskeletal:     Cervical back:  Normal range of motion.  Lymphadenopathy:     Cervical: No cervical adenopathy.  Skin:    General: Skin is warm and dry.     Findings: No rash.  Neurological:     Mental Status: She is alert and oriented to person, place, and time.  Psychiatric:        Mood and Affect: Mood normal.        Behavior: Behavior normal.      Assessment and Plan   1. Non-recurrent acute serous otitis media of both ears - azithromycin (ZITHROMAX) 250 MG tablet; Take 2 tab p.o. on day 1, then take 1 tab for 4 more days.  Dispense: 6 tablet; Refill: 0 - fluticasone (FLONASE) 50 MCG/ACT nasal spray; Place 2 sprays into both nostrils daily.  Dispense: 16 g; Refill: 1  2. Allergic rhinitis, unspecified seasonality, unspecified trigger - Novel Coronavirus, NAA ([facility])   OM-bilateral likely serous effusion to bilateral ears.   Recommending-mucinex d or sudafed for congestion. Recommending Flonase, sinus rinses prn. Gave watch and wait script for azithromycin, if ear pain worsening or getting a fever. Likely viral vs allergy and use symptomatic tx.  F/u prn.

## 2020-09-11 LAB — NOVEL CORONAVIRUS, NAA: SARS-CoV-2, NAA: NOT DETECTED

## 2020-09-11 LAB — SARS-COV-2, NAA 2 DAY TAT

## 2020-09-21 ENCOUNTER — Other Ambulatory Visit: Payer: Self-pay | Admitting: Family Medicine

## 2020-10-07 ENCOUNTER — Telehealth (HOSPITAL_COMMUNITY): Payer: Self-pay | Admitting: Licensed Clinical Social Worker

## 2020-10-08 ENCOUNTER — Telehealth (HOSPITAL_COMMUNITY): Payer: Self-pay | Admitting: Licensed Clinical Social Worker

## 2020-10-08 NOTE — Telephone Encounter (Signed)
Pt states she is seeking clarification and help regarding the bill she received her PHP services. Pt states she confirmed prior to beginning PHP that the services were covered by her insurance company however she received an approximate $3000 charge over what she was quoted. Pt states insurance company reports they rejected the claims from code "NCF1" and there were 25 claims that they are not covering due to this code. Pt reports she went "back and forth" with the [facility] billing department who eventually referred her back to [place] stating they cannot alter the codes. Pt reports this has been ongoing since the Spring and she is seeking resolution.  Cln provided support and repeated back information to confirm it was accurate. Cln informed pt that cln does not know what happened or what these codes mean, but will send it on to management to seek answers. Cln informed pt that cln cannot give a time frame in which pt can expect to hear back. Cln emailed information to Research officer, political party, [employer]. [place] to investigate.

## 2020-10-14 ENCOUNTER — Encounter: Payer: Self-pay | Admitting: Family Medicine

## 2020-10-14 ENCOUNTER — Other Ambulatory Visit: Payer: Self-pay

## 2020-10-14 ENCOUNTER — Telehealth (INDEPENDENT_AMBULATORY_CARE_PROVIDER_SITE_OTHER): Payer: Self-pay | Admitting: Family Medicine

## 2020-10-14 VITALS — HR 84 | Temp 98.3°F | Resp 16

## 2020-10-14 DIAGNOSIS — B9689 Other specified bacterial agents as the cause of diseases classified elsewhere: Secondary | ICD-10-CM

## 2020-10-14 DIAGNOSIS — J029 Acute pharyngitis, unspecified: Secondary | ICD-10-CM

## 2020-10-14 DIAGNOSIS — Z20822 Contact with and (suspected) exposure to covid-19: Secondary | ICD-10-CM

## 2020-10-14 DIAGNOSIS — J019 Acute sinusitis, unspecified: Secondary | ICD-10-CM

## 2020-10-14 DIAGNOSIS — K1379 Other lesions of oral mucosa: Secondary | ICD-10-CM | POA: Insufficient documentation

## 2020-10-14 LAB — POCT RAPID STREP A (OFFICE): Rapid Strep A Screen: NEGATIVE

## 2020-10-14 MED ORDER — MAGIC MOUTHWASH: 5.0000 mL | Freq: Four times a day (QID) | ORAL | 0 refills | Status: DC | PRN

## 2020-10-14 MED ORDER — AZITHROMYCIN 250 MG PO TABS: ORAL_TABLET | ORAL | 0 refills | Status: DC

## 2020-10-14 NOTE — Progress Notes (Signed)
Pt having headache, sore throat, not feeling good, back of head through shoulders hurt, roof of mouth is sore. Yesterday pt had red spot in back of mouth. No temp. Pt having some runny nose.  Has been around someone with COVID. Pt had COVID last November.     Patient ID: Mary [name], female    DOB: 09-30-1966, 54 y.o.   MRN: [id number]   Chief Complaint  Patient presents with  . Headache   Subjective:  CC; Covid exposure (daughter), irritated/tender throat, pain back of head  This is a new problem.  Presents today for an acute same-day visit with a complaint of close Covid exposure, pain going down the back of the head to the neck, runny nose described as sniffles and an irritated throat with a tender area at the roof of the mouth.  Pertinent negatives include no fever, no chills, no fatigue, no cough, no ear pain, no abdominal pain.  She is not immunized against COVID-19 infection.    Medical History Jazmene has a past medical history of Fibromyalgia, Migraine, and Thyroid disease.   Outpatient Encounter Medications as of 10/14/2020  Medication Sig  . clotrimazole-betamethasone (LOTRISONE) cream clotrimazole-betamethasone 1 %-0.05 % topical cream  APPLY TOPICALLY TO AFFECTED AREA TWICE DAILY IN THE MORNING AND EVENING AS NEEDED.  Marland Kitchen cyclobenzaprine (FLEXERIL) 10 MG tablet cyclobenzaprine 10 mg tablet  TAKE 1 TABLET BY MOUTH THREE TIMES DAILY  . desvenlafaxine (PRISTIQ) 50 MG 24 hr tablet Take 1 tablet (50 mg total) by mouth daily.  . ergocalciferol (VITAMIN D2) 1.25 MG (50000 UT) capsule SMARTSIG:1 Capsule(s) By Mouth Once a Week  . fluticasone (FLONASE) 50 MCG/ACT nasal spray Place 2 sprays into both nostrils daily.  Marland Kitchen lamoTRIgine (LAMICTAL) 25 MG tablet Take 3 tablets daily (75 mg)  . meloxicam (MOBIC) 7.5 MG tablet Take 1 tablet (7.5 mg total) by mouth in the morning and at bedtime.  . rosuvastatin (CRESTOR) 5 MG tablet Take 1 tablet (5 mg total) by mouth daily.  Marland Kitchen  SYNTHROID 75 MCG tablet Take 1 tablet (75 mcg total) by mouth daily before breakfast.  . tiZANidine (ZANAFLEX) 4 MG tablet TAKE 1/2 (ONE-HALF) TABLET BY MOUTH IN THE MORNING,  1/2 (ONE-HALF) TABLET IN THE EVENING AND 1 WHOLE TABLET AT BEDTIME.  . valACYclovir (VALTREX) 500 MG tablet TAKE 1 TABLET BY MOUTH TWICE DAILY FOR 3 DAYS AT  ONSET  OF  OUTBREAK  . [DISCONTINUED] azithromycin (ZITHROMAX) 250 MG tablet Take 2 tab p.o. on day 1, then take 1 tab for 4 more days.  . [DISCONTINUED] magic mouthwash SOLN 1 teaspoon swish/gargle and spit 4 x per day for 5 days then PRN.  Marland Kitchen azithromycin (ZITHROMAX) 250 MG tablet Take two tablets by mouth today, then one tablet by mouth for four days.  . magic mouthwash SOLN Take 5 mLs by mouth 4 (four) times daily as needed for mouth pain. Do not swallow   No facility-administered encounter medications on file as of 10/14/2020.     Review of Systems  Constitutional: Negative for chills and fever.  HENT: Positive for rhinorrhea, sinus pressure, sinus pain and sore throat. Negative for ear pain.   Respiratory: Negative for shortness of breath.   Cardiovascular: Negative for chest pain.  Gastrointestinal: Negative for abdominal pain.     Vitals Pulse 84   Temp 98.3 F (36.8 C)   Resp 16   SpO2 96%   Objective:   Physical Exam Vitals reviewed.  Constitutional:  Appearance: Normal appearance.  HENT:     Right Ear: Tympanic membrane normal.     Left Ear: Tympanic membrane normal.     Nose: Rhinorrhea present.     Right Turbinates: Not swollen.     Left Turbinates: Swollen.     Right Sinus: Frontal sinus tenderness present. No maxillary sinus tenderness.     Left Sinus: Frontal sinus tenderness present. No maxillary sinus tenderness.     Mouth/Throat:     Mouth: Mucous membranes are moist.     Pharynx: Posterior oropharyngeal erythema present. No oropharyngeal exudate.  Cardiovascular:     Rate and Rhythm: Normal rate and regular rhythm.      Heart sounds: Normal heart sounds.  Pulmonary:     Effort: Pulmonary effort is normal.     Breath sounds: Normal breath sounds.  Skin:    General: Skin is warm and dry.  Neurological:     General: No focal deficit present.     Mental Status: She is alert.  Psychiatric:        Behavior: Behavior normal.      Assessment and Plan   1. Exposure to COVID-19 virus - Novel Coronavirus, NAA ([facility])  2. Sore throat - POCT rapid strep A  3. Acute bacterial rhinosinusitis - azithromycin (ZITHROMAX) 250 MG tablet; Take two tablets by mouth today, then one tablet by mouth for four days.  Dispense: 6 tablet; Refill: 0  4. Mouth pain - magic mouthwash SOLN; Take 5 mLs by mouth 4 (four) times daily as needed for mouth pain. Do not swallow  Dispense: 100 mL; Refill: 0   Rapid strep negative.  Does not really describe her throat as sore, feels irritated, with a sore area on the hard palate.  Will treat irritated mouth with Magic mouthwash.  She is instructed to swish and spit out.  Do not swallow.  Had significant frontal maxillary sinus pain and pressure upon palpation, will treat for acute bacterial rhinosinusitis.  Due to allergies, will treat with a Z-Pak.  Agrees with plan of care discussed today. Understands warning signs to seek further care: Chest pain, shortness of breath, any significant change in health. Understands to follow-up if symptoms do not improve, or worsen.  Will notify once Covid results are available, may see them on my chart also.  Quarantine instructions reviewed, Covid results usually available within 1 to 2 days.    Mary Olive, NP 10/14/2020

## 2020-10-16 LAB — SARS-COV-2, NAA 2 DAY TAT

## 2020-10-16 LAB — NOVEL CORONAVIRUS, NAA: SARS-CoV-2, NAA: NOT DETECTED

## 2020-10-19 ENCOUNTER — Other Ambulatory Visit (HOSPITAL_COMMUNITY): Payer: Self-pay | Admitting: Psychiatry

## 2020-10-19 DIAGNOSIS — F3181 Bipolar II disorder: Secondary | ICD-10-CM

## 2020-10-21 ENCOUNTER — Telehealth (HOSPITAL_COMMUNITY): Payer: Self-pay | Admitting: *Deleted

## 2020-10-21 MED ORDER — LAMOTRIGINE 100 MG PO TABS: 100.0000 mg | ORAL_TABLET | Freq: Every day | ORAL | 1 refills | Status: DC

## 2020-10-21 NOTE — Addendum Note (Signed)
Addended by: Zena Amos on: 10/21/2020 06:36 PM   Modules accepted: Orders

## 2020-10-21 NOTE — Telephone Encounter (Signed)
Call from patient seeking rx for her Pristique and Lamictal. Reviewed chart and she was given a 3 month supply of both ordered on 08/27/20. She should have ample medications left on both, till 11/26/20 per my math. I called her pharmacy and checked with tech. She has been filling 30 pills at a time and does have a month left of Pristque. She filled Lamictal on 11/4, 11/28 and 12/21. She should not be out but there is not any refills available. Her next appt is with Dr Evelene Croon on 12/16/20. Called patient back to check on what she has remaining. She states she has been taking 4 Lamictal daily for awhile.She states she had a bad experience awhile back where she went several days without it and it was bad and she wants to make sure that doesn't happen again. She is not out of Lamictal but will be soon and will definitely be out before her appt in Feb. Will bring concern to Dr Magdalen Spatz attention.

## 2020-10-21 NOTE — Telephone Encounter (Signed)
Will send new Rx for Lamictal 100 mg since she is taking 4 tabs of 25 mg strength. Please instruct her to take one tablet daily. Please clarify to her that this is a 100 mg tab and not 25 mg strength so she should take 1 tab daily.

## 2020-10-27 ENCOUNTER — Encounter: Payer: Self-pay | Admitting: Family Medicine

## 2020-10-27 ENCOUNTER — Other Ambulatory Visit: Payer: Self-pay

## 2020-10-27 ENCOUNTER — Ambulatory Visit
Admission: EM | Admit: 2020-10-27 | Discharge: 2020-10-27 | Disposition: A | Discharge status: Home / Self Care | Payer: No Typology Code available for payment source | Attending: Family Medicine | Admitting: Family Medicine

## 2020-10-27 ENCOUNTER — Encounter: Payer: Self-pay | Admitting: Emergency Medicine

## 2020-10-27 ENCOUNTER — Telehealth: Payer: Self-pay

## 2020-10-27 DIAGNOSIS — H66002 Acute suppurative otitis media without spontaneous rupture of ear drum, left ear: Secondary | ICD-10-CM

## 2020-10-27 DIAGNOSIS — R509 Fever, unspecified: Secondary | ICD-10-CM

## 2020-10-27 DIAGNOSIS — R5383 Other fatigue: Secondary | ICD-10-CM

## 2020-10-27 DIAGNOSIS — J0140 Acute pansinusitis, unspecified: Secondary | ICD-10-CM

## 2020-10-27 DIAGNOSIS — R52 Pain, unspecified: Secondary | ICD-10-CM

## 2020-10-27 DIAGNOSIS — R059 Cough, unspecified: Secondary | ICD-10-CM

## 2020-10-27 DIAGNOSIS — R6883 Chills (without fever): Secondary | ICD-10-CM

## 2020-10-27 MED ORDER — CEFUROXIME AXETIL 250 MG PO TABS: 250.0000 mg | ORAL_TABLET | Freq: Two times a day (BID) | ORAL | 0 refills | Status: AC

## 2020-10-27 MED ORDER — PROMETHAZINE-DM 6.25-15 MG/5ML PO SYRP: 5.0000 mL | ORAL_SOLUTION | Freq: Four times a day (QID) | ORAL | 0 refills | Status: DC | PRN

## 2020-10-27 NOTE — ED Triage Notes (Signed)
C/o headache since Sunday, sore throat since last night, left ear pain

## 2020-10-27 NOTE — Telephone Encounter (Signed)
See mychart message. I called pt and got more information because she also sent in a message through [employer]

## 2020-10-27 NOTE — ED Provider Notes (Incomplete)
[facility]    CSN: [id number] Arrival date & time: 10/27/20  1709      History   Chief Complaint No chief complaint on file.   HPI Mary [name] is a 55 y.o. female.   HPI  Past Medical History:  Diagnosis Date  . Fibromyalgia   . Migraine   . Thyroid disease     Patient Active Problem List   Diagnosis Date Noted  . Sore throat 10/14/2020  . Exposure to COVID-19 virus 10/14/2020  . Acute bacterial rhinosinusitis 10/14/2020  . Mouth pain 10/14/2020  . Genital herpes 05/13/2020  . Social anxiety disorder 02/26/2020  . Bipolar 2 disorder (HCC) 01/09/2020  . Anxiety 01/09/2020  . Mixed hyperlipidemia 02/14/2019  . Hypothyroidism 09/26/2018  . Goiter 09/26/2018  . Palpitations 09/25/2015  . Adjustment disorder with mixed anxiety and depressed mood 08/03/2014  . Fibromyalgia affecting multiple sites 05/28/2014    Past Surgical History:  Procedure Laterality Date  . TONSILLECTOMY    . TUBAL LIGATION    . uterine ablation      OB History   No obstetric history on file.      Home Medications    Prior to Admission medications   Medication Sig Start Date End Date Taking? Authorizing Provider  azithromycin (ZITHROMAX) 250 MG tablet Take two tablets by mouth today, then one tablet by mouth for four days. 10/14/20   [name], NP  clotrimazole-betamethasone (LOTRISONE) cream clotrimazole-betamethasone 1 %-0.05 % topical cream  APPLY TOPICALLY TO AFFECTED AREA TWICE DAILY IN THE MORNING AND EVENING AS NEEDED.    [provider]  cyclobenzaprine (FLEXERIL) 10 MG tablet cyclobenzaprine 10 mg tablet  TAKE 1 TABLET BY MOUTH THREE TIMES DAILY    [provider]  desvenlafaxine (PRISTIQ) 50 MG 24 hr tablet Take 1 tablet (50 mg total) by mouth daily. 08/27/20   Zena Amos, MD  ergocalciferol (VITAMIN D2) 1.25 MG (50000 UT) capsule SMARTSIG:1 Capsule(s) By Mouth Once a Week 10/24/19   [provider]  fluticasone  (FLONASE) 50 MCG/ACT nasal spray Place 2 sprays into both nostrils daily. 09/09/20   Annalee Genta, DO  lamoTRIgine (LAMICTAL) 100 MG tablet Take 1 tablet (100 mg total) by mouth daily. 10/21/20   Zena Amos, MD  magic mouthwash SOLN Take 5 mLs by mouth 4 (four) times daily as needed for mouth pain. Do not swallow 10/14/20   [name], NP  meloxicam (MOBIC) 7.5 MG tablet Take 1 tablet (7.5 mg total) by mouth in the morning and at bedtime. 02/12/20   Laroy Apple M, DO  rosuvastatin (CRESTOR) 5 MG tablet Take 1 tablet (5 mg total) by mouth daily. 03/04/20   Roma Kayser, MD  SYNTHROID 75 MCG tablet Take 1 tablet (75 mcg total) by mouth daily before breakfast. 03/04/20   Nida, Denman George, MD  tiZANidine (ZANAFLEX) 4 MG tablet TAKE 1/2 (ONE-HALF) TABLET BY MOUTH IN THE MORNING,  1/2 (ONE-HALF) TABLET IN THE EVENING AND 1 WHOLE TABLET AT BEDTIME. 04/06/20   Ladona Ridgel, Malena M, DO  valACYclovir (VALTREX) 500 MG tablet TAKE 1 TABLET BY MOUTH TWICE DAILY FOR 3 DAYS AT  ONSET  OF  OUTBREAK 09/22/20   Annalee Genta, DO    Family History Family History  Problem Relation Age of Onset  . Heart disease Mother   . Depression Mother   . Heart disease Father   . Depression Father   . Depression Maternal Uncle  Social History Social History   Tobacco Use  . Smoking status: Former Smoker    Types: Cigarettes  . Smokeless tobacco: Never Used  . Tobacco comment: quit feb 2016  Vaping Use  . Vaping Use: Never used  Substance Use Topics  . Alcohol use: No    Alcohol/week: 0.0 standard drinks  . Drug use: No     Allergies   Augmentin [amoxicillin-pot clavulanate] and Bactrim [sulfamethoxazole-trimethoprim]   Review of Systems Review of Systems   Physical Exam Triage Vital Signs ED Triage Vitals  Enc Vitals Group     BP 10/27/20 1902 134/86     Pulse Rate 10/27/20 1902 90     Resp 10/27/20 1902 18     Temp 10/27/20 1902 99 F (37.2 C)     Temp Source 10/27/20  1902 Oral     SpO2 10/27/20 1902 99 %     Weight 10/27/20 1903 167 lb (75.8 kg)     Height 10/27/20 1903 5\' 2"  (1.575 m)     Head Circumference --      Peak Flow --      Pain Score 10/27/20 1903 8     Pain Loc --      Pain Edu? --      Excl. in GC? --    No data found.  Updated Vital Signs BP 134/86 (BP Location: Right Arm)   Pulse 90   Temp 99 F (37.2 C) (Oral)   Resp 18   Ht 5\' 2"  (1.575 m)   Wt 167 lb (75.8 kg)   SpO2 99%   BMI 30.54 kg/m   Visual Acuity Right Eye Distance:   Left Eye Distance:   Bilateral Distance:    Right Eye Near:   Left Eye Near:    Bilateral Near:     Physical Exam   UC Treatments / Results  Labs (all labs ordered are listed, but only abnormal results are displayed) Labs Reviewed - No data to display  EKG   Radiology No results found.  Procedures Procedures (including critical care time)  Medications Ordered in UC Medications - No data to display  Initial Impression / Assessment and Plan / UC Course  I have reviewed the triage vital signs and the nursing notes.  Pertinent labs & imaging results that were available during my care of the patient were reviewed by me and considered in my medical decision making (see chart for details).     *** Final Clinical Impressions(s) / UC Diagnoses   Final diagnoses:  None   Discharge Instructions   None    ED Prescriptions    None     PDMP not reviewed this encounter.

## 2020-10-27 NOTE — Telephone Encounter (Signed)
Pt states she was given zpack 12/22 filled but never took. States she was told to keep on hand in case she needed it. She has lost rx and wants to know if it can be sent in again to [facility] in [place]. Her symptoms cleared up from when she was seen on 12/22 but states same symptoms came back but more intense yesterday. Left ear pain, tightness in head, cough started today but coughing from a tickle in throat, no fever, no sob. Please advise if med can be called in again or if she needs to be seen.

## 2020-10-27 NOTE — Telephone Encounter (Signed)
Since its been 12 days, it would be best to see her again for another physical exam.  Thanks, KD

## 2020-10-27 NOTE — Telephone Encounter (Signed)
Discussed with pt and transferred to the front to schedule appt

## 2020-10-27 NOTE — Telephone Encounter (Signed)
Patient is requesting another antibiotic states in her mychart message to Clydie Braun she misplaced it. She is still having earpain,sorethroat head tightness,she was seen 12/22. [facility]

## 2020-10-27 NOTE — Discharge Instructions (Signed)
I have sent in Ceftin for you to take twice a day for 7 days  I have sent in cough syrup for you to take. This medication can make you sleepy. Do not drive while taking this medication.  Your COVID and Flu tests are pending.  You should self quarantine until the test results are back.    Take Tylenol or ibuprofen as needed for fever or discomfort.  Rest and keep yourself hydrated.    Follow-up with your primary care provider if your symptoms are not improving.

## 2020-10-28 ENCOUNTER — Other Ambulatory Visit: Payer: Self-pay

## 2020-10-28 ENCOUNTER — Encounter: Payer: Self-pay | Admitting: Family Medicine

## 2020-10-28 ENCOUNTER — Ambulatory Visit (INDEPENDENT_AMBULATORY_CARE_PROVIDER_SITE_OTHER): Payer: Self-pay | Admitting: Family Medicine

## 2020-10-28 VITALS — HR 100 | Temp 98.2°F | Resp 18

## 2020-10-28 DIAGNOSIS — J069 Acute upper respiratory infection, unspecified: Secondary | ICD-10-CM

## 2020-10-28 DIAGNOSIS — H6501 Acute serous otitis media, right ear: Secondary | ICD-10-CM

## 2020-10-28 DIAGNOSIS — J029 Acute pharyngitis, unspecified: Secondary | ICD-10-CM

## 2020-10-28 LAB — POCT RAPID STREP A (OFFICE): Rapid Strep A Screen: NEGATIVE

## 2020-10-28 MED ORDER — MAGIC MOUTHWASH W/LIDOCAINE: 5.0000 mL | Freq: Three times a day (TID) | ORAL | 0 refills | Status: DC | PRN

## 2020-10-28 MED ORDER — FLUTICASONE PROPIONATE 50 MCG/ACT NA SUSP: 2.0000 | Freq: Every day | NASAL | 1 refills | Status: DC

## 2020-10-28 MED ORDER — PREDNISONE 20 MG PO TABS: 40.0000 mg | ORAL_TABLET | Freq: Every day | ORAL | 0 refills | Status: DC

## 2020-10-28 NOTE — Progress Notes (Signed)
Patient ID: Mary [name], female    DOB: 1966-09-21, 55 y.o.   MRN: [id number]   Chief Complaint  Patient presents with  . Sore Throat    Ear pain, headache and fever since Sunday Covid test pending   Subjective:    HPI  CC- sore throat and ear pain/right. Past 3 days having worsening ear pain, sore throat and coughing.  Has had a fever of 101F today.  Was seen in urgent care yesterday and dx with otitis media and given cefdinir.  Pt stating not feeling better.  Feeling worse.  covid testing pending. Pt was seen in 09/09/20- for sinusitis, was given Zithromax. Due to allergy with Augmentin and bactrim. Pt seen again 10/14/20 by NP- for covid exposure with daughter and throat pain.  Was tx for sinusitis- given azithromycin again and magic mouthwash.  Seen 10/27/20- by urgent care for ear pain/ OM and given cefuroxime and cough syrup prn.  Medical History Mary [name] has a past medical history of Fibromyalgia, Migraine, and Thyroid disease.   Outpatient Encounter Medications as of 10/28/2020  Medication Sig  . fluticasone (FLONASE) 50 MCG/ACT nasal spray Place 2 sprays into both nostrils daily.  . magic mouthwash w/lidocaine SOLN Take 5 mLs by mouth 3 (three) times daily as needed for mouth pain. Swish, gargle and spit.  . predniSONE (DELTASONE) 20 MG tablet Take 2 tablets (40 mg total) by mouth daily with breakfast.  . cefUROXime (CEFTIN) 250 MG tablet Take 1 tablet (250 mg total) by mouth 2 (two) times daily with a meal for 7 days. (Patient not taking: Reported on 10/28/2020)  . clotrimazole-betamethasone (LOTRISONE) cream clotrimazole-betamethasone 1 %-0.05 % topical cream  APPLY TOPICALLY TO AFFECTED AREA TWICE DAILY IN THE MORNING AND EVENING AS NEEDED.  Marland Kitchen cyclobenzaprine (FLEXERIL) 10 MG tablet cyclobenzaprine 10 mg tablet  TAKE 1 TABLET BY MOUTH THREE TIMES DAILY  . desvenlafaxine (PRISTIQ) 50 MG 24 hr tablet Take 1 tablet (50 mg total) by mouth daily.  . ergocalciferol  (VITAMIN D2) 1.25 MG (50000 UT) capsule SMARTSIG:1 Capsule(s) By Mouth Once a Week  . lamoTRIgine (LAMICTAL) 100 MG tablet Take 1 tablet (100 mg total) by mouth daily.  . meloxicam (MOBIC) 7.5 MG tablet Take 1 tablet (7.5 mg total) by mouth in the morning and at bedtime.  . promethazine-dextromethorphan (PROMETHAZINE-DM) 6.25-15 MG/5ML syrup Take 5 mLs by mouth 4 (four) times daily as needed for cough.  . rosuvastatin (CRESTOR) 5 MG tablet Take 1 tablet (5 mg total) by mouth daily.  Marland Kitchen SYNTHROID 75 MCG tablet Take 1 tablet (75 mcg total) by mouth daily before breakfast.  . tiZANidine (ZANAFLEX) 4 MG tablet TAKE 1/2 (ONE-HALF) TABLET BY MOUTH IN THE MORNING,  1/2 (ONE-HALF) TABLET IN THE EVENING AND 1 WHOLE TABLET AT BEDTIME.  . valACYclovir (VALTREX) 500 MG tablet TAKE 1 TABLET BY MOUTH TWICE DAILY FOR 3 DAYS AT  ONSET  OF  OUTBREAK  . [DISCONTINUED] azithromycin (ZITHROMAX) 250 MG tablet Take two tablets by mouth today, then one tablet by mouth for four days. (Patient not taking: Reported on 10/28/2020)  . [DISCONTINUED] fluticasone (FLONASE) 50 MCG/ACT nasal spray Place 2 sprays into both nostrils daily.  . [DISCONTINUED] magic mouthwash SOLN Take 5 mLs by mouth 4 (four) times daily as needed for mouth pain. Do not swallow   No facility-administered encounter medications on file as of 10/28/2020.     Review of Systems  Constitutional: Positive for fatigue and fever. Negative for chills.  HENT: Positive  for congestion, ear pain and sore throat. Negative for ear discharge, hearing loss, postnasal drip, rhinorrhea, sinus pressure and sinus pain.   Respiratory: Positive for cough. Negative for shortness of breath and wheezing.   Cardiovascular: Negative for chest pain and leg swelling.  Gastrointestinal: Negative for abdominal pain, diarrhea, nausea and vomiting.  Genitourinary: Negative for dysuria and frequency.  Musculoskeletal: Negative for arthralgias and back pain.  Skin: Negative for rash.   Neurological: Positive for headaches. Negative for dizziness, weakness and light-headedness.     Vitals Pulse 100   Temp 98.2 F (36.8 C) (Temporal)   Resp 18   SpO2 99%   Objective:   Physical Exam Vitals and nursing note reviewed.  Constitutional:      General: She is not in acute distress.    Appearance: Normal appearance. She is ill-appearing. She is not toxic-appearing.  HENT:     Head: Normocephalic and atraumatic.     Right Ear: Ear canal and external ear normal. No drainage or swelling. Tympanic membrane is not erythematous.     Left Ear: Tympanic membrane, ear canal and external ear normal. No drainage or swelling. Tympanic membrane is not erythematous.     Ears:     Comments: Rt tm with serous effusion    Nose: Nose normal. No congestion or rhinorrhea.     Mouth/Throat:     Mouth: Mucous membranes are moist. No oral lesions.     Pharynx: Oropharynx is clear. Uvula midline. Posterior oropharyngeal erythema present. No pharyngeal swelling or oropharyngeal exudate.     Tonsils: No tonsillar exudate or tonsillar abscesses.  Eyes:     Extraocular Movements: Extraocular movements intact.     Conjunctiva/sclera: Conjunctivae normal.     Pupils: Pupils are equal, round, and reactive to light.  Cardiovascular:     Rate and Rhythm: Normal rate and regular rhythm.     Heart sounds: Normal heart sounds. No murmur heard.   Pulmonary:     Effort: Pulmonary effort is normal. No respiratory distress.  Musculoskeletal:     Cervical back: Normal range of motion.  Lymphadenopathy:     Cervical: No cervical adenopathy.  Skin:    General: Skin is warm and dry.     Findings: No rash.  Neurological:     Mental Status: She is alert and oriented to person, place, and time.  Psychiatric:        Mood and Affect: Mood normal.        Behavior: Behavior normal.      Assessment and Plan   1. Non-recurrent acute serous otitis media of right ear - fluticasone (FLONASE) 50  MCG/ACT nasal spray; Place 2 sprays into both nostrils daily.  Dispense: 16 g; Refill: 1 - predniSONE (DELTASONE) 20 MG tablet; Take 2 tablets (40 mg total) by mouth daily with breakfast.  Dispense: 10 tablet; Refill: 0  2. Sore throat - Strep A DNA probe - POCT rapid strep A - magic mouthwash w/lidocaine SOLN; Take 5 mLs by mouth 3 (three) times daily as needed for mouth pain. Swish, gargle and spit.  Dispense: 100 mL; Refill: 0  3. Viral URI with cough   Cont to finish cefdinir for 10 days from the urgent care for concern of otitis media. For serous effusion- Add flonase and sudafed.  Ibuprofen 800mg  tid prn . [facility] magic mouthwash.  Prednisone 40mg  for 5 days. covid testing pending from urgent care- on 10/27/20. covid test 2 wks ago- 10/14/20- negative.   F/u 2-3  days if not improving.  Pt in agreement.

## 2020-10-29 LAB — STREP A DNA PROBE: Strep Gp A Direct, DNA Probe: NEGATIVE

## 2020-10-29 LAB — COVID-19, FLU A+B NAA
Influenza A, NAA: NOT DETECTED
Influenza B, NAA: NOT DETECTED
SARS-CoV-2, NAA: DETECTED — AB

## 2020-10-29 LAB — SPECIMEN STATUS REPORT

## 2020-10-30 NOTE — ED Provider Notes (Signed)
[facility]   [id number] 10/27/20 Arrival Time: 1709   CC: COVID symptoms  SUBJECTIVE: History from: patient.  Mary [name] is a 55 y.o. female who presents with headache, sore throat, and left ear pain since last night. Denies sick exposure to COVID, flu or strep. Denies recent travel. Has negative history of Covid. Has not completed Covid vaccines. Has not taken OTC medications for this.There are no aggravating or alleviating factors. Denies previous symptoms in the past. Denies fever, chills, fatigue, sinus pain, rhinorrhea, SOB, wheezing, chest pain, nausea, changes in bowel or bladder habits.    ROS: As per HPI.  All other pertinent ROS negative.     Past Medical History:  Diagnosis Date  . Fibromyalgia   . Migraine   . Thyroid disease    Past Surgical History:  Procedure Laterality Date  . TONSILLECTOMY    . TUBAL LIGATION    . uterine ablation     Allergies  Allergen Reactions  . Augmentin [Amoxicillin-Pot Clavulanate]     swelling  . Bactrim [Sulfamethoxazole-Trimethoprim]     Lips swelling   No current facility-administered medications on file prior to encounter.   Current Outpatient Medications on File Prior to Encounter  Medication Sig Dispense Refill  . clotrimazole-betamethasone (LOTRISONE) cream clotrimazole-betamethasone 1 %-0.05 % topical cream  APPLY TOPICALLY TO AFFECTED AREA TWICE DAILY IN THE MORNING AND EVENING AS NEEDED.    Marland Kitchen cyclobenzaprine (FLEXERIL) 10 MG tablet cyclobenzaprine 10 mg tablet  TAKE 1 TABLET BY MOUTH THREE TIMES DAILY    . desvenlafaxine (PRISTIQ) 50 MG 24 hr tablet Take 1 tablet (50 mg total) by mouth daily. 90 tablet 0  . ergocalciferol (VITAMIN D2) 1.25 MG (50000 UT) capsule SMARTSIG:1 Capsule(s) By Mouth Once a Week    . lamoTRIgine (LAMICTAL) 100 MG tablet Take 1 tablet (100 mg total) by mouth daily. 30 tablet 1  . meloxicam (MOBIC) 7.5 MG tablet Take 1 tablet (7.5 mg total) by mouth in the morning and at  bedtime. 60 tablet 3  . rosuvastatin (CRESTOR) 5 MG tablet Take 1 tablet (5 mg total) by mouth daily. 30 tablet 3  . SYNTHROID 75 MCG tablet Take 1 tablet (75 mcg total) by mouth daily before breakfast. 90 tablet 1  . tiZANidine (ZANAFLEX) 4 MG tablet TAKE 1/2 (ONE-HALF) TABLET BY MOUTH IN THE MORNING,  1/2 (ONE-HALF) TABLET IN THE EVENING AND 1 WHOLE TABLET AT BEDTIME. 60 tablet 0  . valACYclovir (VALTREX) 500 MG tablet TAKE 1 TABLET BY MOUTH TWICE DAILY FOR 3 DAYS AT  ONSET  OF  OUTBREAK 24 tablet 0   Social History   Socioeconomic History  . Marital status: Married    Spouse name: Not on file  . Number of children: Not on file  . Years of education: Not on file  . Highest education level: Not on file  Occupational History  . Not on file  Tobacco Use  . Smoking status: Former Smoker    Types: Cigarettes  . Smokeless tobacco: Never Used  . Tobacco comment: quit feb 2016  Vaping Use  . Vaping Use: Never used  Substance and Sexual Activity  . Alcohol use: No    Alcohol/week: 0.0 standard drinks  . Drug use: No  . Sexual activity: Yes    Birth control/protection: Surgical  Other Topics Concern  . Not on file  Social History Narrative  . Not on file   Social Determinants of Health   Financial Resource Strain: Not on file  Food Insecurity: Not on file  Transportation Needs: Not on file  Physical Activity: Not on file  Stress: Not on file  Social Connections: Not on file  Intimate Partner Violence: Not on file   Family History  Problem Relation Age of Onset  . Heart disease Mother   . Depression Mother   . Heart disease Father   . Depression Father   . Depression Maternal Uncle     OBJECTIVE:  Vitals:   10/27/20 1902 10/27/20 1903  BP: 134/86   Pulse: 90   Resp: 18   Temp: 99 F (37.2 C)   TempSrc: Oral   SpO2: 99%   Weight:  167 lb (75.8 kg)  Height:  5\' 2"  (1.575 m)     General appearance: alert; appears fatigued, but nontoxic; speaking in full  sentences and tolerating own secretions HEENT: NCAT; Ears: EACs clear, TMs pearly gray; Eyes: PERRL.  EOM grossly intact. Sinuses: nontender; Nose: nares patent with clear rhinorrhea, Throat: oropharynx erythematous, cobblestoning present, tonsils non erythematous or enlarged, uvula midline  Neck: supple without LAD Lungs: unlabored respirations, symmetrical air entry; cough: absent; no respiratory distress; CTAB Heart: regular rate and rhythm.  Radial pulses 2+ symmetrical bilaterally Skin: warm and dry Psychological: alert and cooperative; normal mood and affect  LABS:  No results found for this or any previous visit (from the past 24 hour(s)).   ASSESSMENT & PLAN:  1. Non-recurrent acute suppurative otitis media of left ear without spontaneous rupture of tympanic membrane   2. Cough   3. Fever, unspecified fever cause   4. Other fatigue   5. Body aches   6. Chills   7. Acute non-recurrent pansinusitis     Meds ordered this encounter  Medications  . promethazine-dextromethorphan (PROMETHAZINE-DM) 6.25-15 MG/5ML syrup    Sig: Take 5 mLs by mouth 4 (four) times daily as needed for cough.    Dispense:  118 mL    Refill:  0    Order Specific Question:   Supervising Provider    Answer:   Chase Picket [id number]  . cefUROXime (CEFTIN) 250 MG tablet    Sig: Take 1 tablet (250 mg total) by mouth 2 (two) times daily with a meal for 7 days.    Dispense:  14 tablet    Refill:  0    Order Specific Question:   Supervising Provider    Answer:   Chase Picket [id number]   Prescribed promethazine syrup Sedation precautions given Prescribed Ceftin for ear infection Continue supportive care at home COVID and flu testing ordered.  It will take between 2-3 days for test results. Someone will contact you regarding abnormal results.  Work note provided Patient should remain in quarantine until they have received Covid results.  If negative you may resume normal activities (go back to  work/school) while practicing hand hygiene, social distance, and mask wearing.  If positive, patient should remain in quarantine for at least 5 days from symptom onset AND greater than 72 hours after symptoms resolution (absence of fever without the use of fever-reducing medication and improvement in respiratory symptoms), whichever is longer Get plenty of rest and push fluids Use OTC zyrtec for nasal congestion, runny nose, and/or sore throat Use OTC flonase for nasal congestion and runny nose Use medications daily for symptom relief Use OTC medications like ibuprofen or tylenol as needed fever or pain Call or go to the ED if you have any new or worsening symptoms such as fever, worsening cough,  shortness of breath, chest tightness, chest pain, turning blue, changes in mental status.  Reviewed expectations re: course of current medical issues. Questions answered. Outlined signs and symptoms indicating need for more acute intervention. Patient verbalized understanding. After Visit Summary given.         Moshe Cipro, NP 10/30/20 [phone]

## 2020-11-02 ENCOUNTER — Encounter: Payer: Self-pay | Admitting: Family Medicine

## 2020-11-04 ENCOUNTER — Telehealth: Payer: Self-pay | Admitting: Family Medicine

## 2020-11-04 ENCOUNTER — Encounter: Payer: Self-pay | Admitting: Family Medicine

## 2020-11-04 NOTE — Telephone Encounter (Signed)
Pt was seen on 10/28/20. Please advise. Thank you

## 2020-11-04 NOTE — Telephone Encounter (Signed)
Ok pls give 3 more days off, returning on 11/07/20.  Thx.   Dr. Ladona Ridgel

## 2020-11-04 NOTE — Telephone Encounter (Signed)
Ladona Ridgel, Malena M, DO  You 35 minutes ago (4:05 PM)     Yes, pls give her the work not she requested. Thx. Dr. Ladona Ridgel

## 2020-11-04 NOTE — Telephone Encounter (Signed)
Please give patient extended work note. Thank you

## 2020-11-04 NOTE — Telephone Encounter (Signed)
Patient is requesting extended work she tried to work today but had to leave early . She states still has sore throat,fever, headache. [phone]

## 2020-11-18 ENCOUNTER — Other Ambulatory Visit (HOSPITAL_COMMUNITY): Payer: Self-pay | Admitting: Psychiatry

## 2020-11-18 DIAGNOSIS — F3181 Bipolar II disorder: Secondary | ICD-10-CM

## 2020-11-18 DIAGNOSIS — F4010 Social phobia, unspecified: Secondary | ICD-10-CM

## 2020-11-19 ENCOUNTER — Telehealth (HOSPITAL_COMMUNITY): Payer: Self-pay | Admitting: *Deleted

## 2020-11-19 DIAGNOSIS — F3181 Bipolar II disorder: Secondary | ICD-10-CM

## 2020-11-19 DIAGNOSIS — F4010 Social phobia, unspecified: Secondary | ICD-10-CM

## 2020-11-19 MED ORDER — TRAZODONE HCL 50 MG PO TABS: 50.0000 mg | ORAL_TABLET | Freq: Every evening | ORAL | 1 refills | Status: DC | PRN

## 2020-11-19 MED ORDER — DESVENLAFAXINE SUCCINATE ER 50 MG PO TB24: 50.0000 mg | ORAL_TABLET | Freq: Every day | ORAL | 1 refills | Status: DC

## 2020-11-19 NOTE — Telephone Encounter (Signed)
Call from patient stating she would like to have Dr Evelene Croon consider calling her in something to help her sleep, at least for the short term. States the week of the 4 th she had COVID, lost her job because of it, got a new job and due to the many changes and her fibramyalgia she is exhausted, in pain and asking for some relief. She is having difficulty falling asleep and once asleep its not a restful sleep and after 5-6 hours she feels as tired as when she went to bed. She reports it takes 1-2 hours to get to sleep. Will forward concern to Dr Evelene Croon for her consideration. Patients next appt is 2/23 at1140.

## 2020-11-19 NOTE — Telephone Encounter (Signed)
Called Mary [name] back to inform of order called in for her to help her sleep. Left a detailed message re to take it approx 30 min prior to sleep and good sleep hygiene and the side effect of dryness.

## 2020-11-19 NOTE — Addendum Note (Signed)
Addended by: Zena Amos on: 11/19/2020 12:44 PM   Modules accepted: Orders

## 2020-11-19 NOTE — Addendum Note (Signed)
Addended by: Zena Amos on: 11/19/2020 08:24 AM   Modules accepted: Orders

## 2020-11-19 NOTE — Telephone Encounter (Signed)
Patient called back, states she forgot to tell me earlier she has lost her Pristique rx in all the stress and changes and is asking Dr Evelene Croon if she will call in a new rx. Her pharmacy also called with this request. Will forward concern.

## 2020-11-19 NOTE — Telephone Encounter (Signed)
We can do a trial of commonly prescribed Trazodone for sleep at bedtime. I am sending the Rx to her pharmacy.

## 2020-11-19 NOTE — Telephone Encounter (Signed)
Done

## 2020-12-16 ENCOUNTER — Telehealth (HOSPITAL_COMMUNITY): Payer: Self-pay | Admitting: Psychiatry

## 2020-12-22 ENCOUNTER — Telehealth (INDEPENDENT_AMBULATORY_CARE_PROVIDER_SITE_OTHER): Payer: No Payment, Other | Admitting: Psychiatry

## 2020-12-22 ENCOUNTER — Encounter (HOSPITAL_COMMUNITY): Payer: Self-pay | Admitting: Psychiatry

## 2020-12-22 ENCOUNTER — Other Ambulatory Visit: Payer: Self-pay

## 2020-12-22 DIAGNOSIS — F4010 Social phobia, unspecified: Secondary | ICD-10-CM

## 2020-12-22 DIAGNOSIS — F3181 Bipolar II disorder: Secondary | ICD-10-CM | POA: Diagnosis not present

## 2020-12-22 MED ORDER — DESVENLAFAXINE SUCCINATE ER 100 MG PO TB24: 100.0000 mg | ORAL_TABLET | Freq: Every day | ORAL | 1 refills | Status: DC

## 2020-12-22 MED ORDER — LAMOTRIGINE 100 MG PO TABS: 100.0000 mg | ORAL_TABLET | Freq: Every day | ORAL | 1 refills | Status: DC

## 2020-12-22 MED ORDER — DOXEPIN HCL 25 MG PO CAPS
25.0000 mg | ORAL_CAPSULE | Freq: Every day | ORAL | 1 refills | Status: DC
Start: 2020-12-22 — End: 2021-01-27

## 2020-12-22 NOTE — Progress Notes (Signed)
BH MD/PA/NP OP Progress Note  Virtual Visit via Video Note  I connected with Mary [name] on 12/22/20 at  9:40 AM EST by a video enabled telemedicine application and verified that I am speaking with the correct person using two identifiers.  Location: Patient: Home Provider: Clinic   I discussed the limitations of evaluation and management by telemedicine and the availability of in person appointments. The patient expressed understanding and agreed to proceed.  I provided 19 minutes of non-face-to-face time during this encounter.      12/22/2020 9:54 AM Mary [name]  MRN:  [id number]  Chief Complaint:  "It has been tough but I am hanging in there."  HPI: Patient reported that she has had a a few months however she has been trying to do her best.  Patient reported that she contracted Covid infection back in January and as result she missed few days of work.  Her employer kind of pushed her to return back to work in which she was not fully recovered.  A few days later her employer decided to fire her.  She stated that she was really upset however she did not take any retaliation action.  She informed that she was able to find any position which she just started yesterday.  She stated that this is a new company and she is really happy about this opportunity.  She informed that she also had a few other options but she chose this what and she is hoping that it will work out well. 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  She stated that she felt very angry when she read that.  She stated that for the past few weeks she has been kind of feeling depressed but is trying very hard to find a job.  She feels like she does not have the energy to do much but is still able to  push through.  She feels her concentration and sleep are not as good.  She stated that she does not want to take the trazodone because it makes her feel groggy at stage the next day. She asked if she can be prescribed something different for sleep. She informed that she went back to taking 4 tablets of Lamictal 25 mg strength so the total dose is 100 mg now.  She has been taking Pristiq 50 mg regularly. She denied any suicidal ideations or any such intent. Writer offered to increase her dose of Pristiq 200 mg for optimal effect and she was agreeable to try that.  She was offered doxepin to target her sleep and she was willing to try that as well. Potential side effects of medication and risks vs benefits of treatment vs non-treatment were explained and discussed. All questions were answered.    Visit Diagnosis:    ICD-10-CM   1. Bipolar 2 disorder (HCC)  F31.81 lamoTRIgine (LAMICTAL) 100 MG tablet    desvenlafaxine (PRISTIQ) 100 MG 24 hr tablet    doxepin (SINEQUAN) 25 MG capsule  2. Social anxiety disorder  F40.10 desvenlafaxine (PRISTIQ) 100 MG 24 hr tablet    Past Psychiatric History: Depression, anxiety  Past Medical History:  Past Medical History:  Diagnosis Date  . Fibromyalgia   . Migraine   . Thyroid disease  Past Surgical History:  Procedure Laterality Date  . TONSILLECTOMY    . TUBAL LIGATION    . uterine ablation      Family Psychiatric History: Maternal grandmother and mother depression/anxiety. Paternal uncle committed suicide. Patient's daughter 35 years old-reported struggling with substance abuse and was recently released from jail.   Family History:  Family History  Problem Relation Age of Onset  . Heart disease Mother   . Depression Mother   . Heart disease Father   . Depression Father   . Depression Maternal Uncle     Social History:  Social History   Socioeconomic History  . Marital status: Married    Spouse name: Not on file  . Number of  children: Not on file  . Years of education: Not on file  . Highest education level: Not on file  Occupational History  . Not on file  Tobacco Use  . Smoking status: Former Smoker    Types: Cigarettes  . Smokeless tobacco: Never Used  . Tobacco comment: quit feb 2016  Vaping Use  . Vaping Use: Never used  Substance and Sexual Activity  . Alcohol use: No    Alcohol/week: 0.0 standard drinks  . Drug use: No  . Sexual activity: Yes    Birth control/protection: Surgical  Other Topics Concern  . Not on file  Social History Narrative  . Not on file   Social Determinants of Health   Financial Resource Strain: Not on file  Food Insecurity: Not on file  Transportation Needs: Not on file  Physical Activity: Not on file  Stress: Not on file  Social Connections: Not on file    Allergies:  Allergies  Allergen Reactions  . Augmentin [Amoxicillin-Pot Clavulanate]     swelling  . Bactrim [Sulfamethoxazole-Trimethoprim]     Lips swelling    Metabolic Disorder Labs: Lab Results  Component Value Date   HGBA1C 5.6 08/16/2019   MPG 114 08/16/2019   No results found for: PROLACTIN Lab Results  Component Value Date   CHOL 202 (H) 02/27/2020   TRIG 79 02/27/2020   HDL 47 (L) 02/27/2020   CHOLHDL 4.3 02/27/2020   LDLCALC 137 (H) 02/27/2020   LDLCALC 154 (H) 08/16/2019   Lab Results  Component Value Date   TSH 0.27 (L) 02/27/2020   TSH 1.37 12/03/2019    Therapeutic Level Labs: No results found for: LITHIUM No results found for: VALPROATE No components found for:  CBMZ  Current Medications: Current Outpatient Medications  Medication Sig Dispense Refill  . desvenlafaxine (PRISTIQ) 100 MG 24 hr tablet Take 1 tablet (100 mg total) by mouth daily. 30 tablet 1  . doxepin (SINEQUAN) 25 MG capsule Take 1 capsule (25 mg total) by mouth at bedtime. 30 capsule 1  . clotrimazole-betamethasone (LOTRISONE) cream clotrimazole-betamethasone 1 %-0.05 % topical cream  APPLY TOPICALLY  TO AFFECTED AREA TWICE DAILY IN THE MORNING AND EVENING AS NEEDED.    Marland Kitchen cyclobenzaprine (FLEXERIL) 10 MG tablet cyclobenzaprine 10 mg tablet  TAKE 1 TABLET BY MOUTH THREE TIMES DAILY    . ergocalciferol (VITAMIN D2) 1.25 MG (50000 UT) capsule SMARTSIG:1 Capsule(s) By Mouth Once a Week    . fluticasone (FLONASE) 50 MCG/ACT nasal spray Place 2 sprays into both nostrils daily. 16 g 1  . lamoTRIgine (LAMICTAL) 100 MG tablet Take 1 tablet (100 mg total) by mouth daily. 30 tablet 1  . rosuvastatin (CRESTOR) 5 MG tablet Take 1 tablet (5 mg total) by mouth daily. 30 tablet 3  .  SYNTHROID 75 MCG tablet Take 1 tablet (75 mcg total) by mouth daily before breakfast. 90 tablet 1   No current facility-administered medications for this visit.      Psychiatric Specialty Exam: Review of Systems  There were no vitals taken for this visit.There is no height or weight on file to calculate BMI.  General Appearance: unable to assess due to phone visit  Eye Contact:  unable to assess due to phone visit  Speech:  Clear and Coherent and Normal Rate  Volume:  Normal  Mood:  Euthymic  Affect:  Congruent  Thought Process:  Goal Directed, Linear and Descriptions of Associations: Intact  Orientation:  Full (Time, Place, and Person)  Thought Content: Logical   Suicidal Thoughts:  No  Homicidal Thoughts:  No  Memory:  Recent;   Good Remote;   Good  Judgement:  Good  Insight:  Good  Psychomotor Activity:  Normal  Concentration:  Concentration: Good and Attention Span: Good  Recall:  Good  Fund of Knowledge: Good  Language: Good  Akathisia:  Negative  Handed:  Right  AIMS (if indicated): not done  Assets:  Communication Skills Desire for Improvement Financial Resources/Insurance Housing  ADL's:  Intact  Cognition: WNL  Sleep:  Good     Screenings: GAD-7   Flowsheet Row Office Visit from 12/18/2018 in [facility]  Total GAD-7 Score 9    PHQ2-9   Flowsheet Row Video Visit from  12/22/2020 in [facility] Counselor from 12/16/2019 in [facility] Counselor from 11/29/2019 in [facility]PITAL[facility]ATION PROGRAM Office Visit from 12/18/2018 in [facility]  PHQ-2 Total Score 2 3 6 3   PHQ-9 Total Score 8 16 24 14     Flowsheet Row Video Visit from 12/22/2020 in [facility]  C-SSRS RISK CATEGORY No Risk       Assessment and Plan: Patient is currently endorsing mild depressive symptoms secondary to being terminated from her previous job.  She has fortunately found a new job and just started there yesterday.  She also complained of poor sleep.  She was offered doxepin to help her with sleep as trazodone caused her to have side effects.  She was also recommended to try higher dose of Pristiq for optimal effect.  Patient also requested to be connected to a therapist.  1. Bipolar 2 disorder (HCC)  - lamoTRIgine (LAMICTAL) 100 MG tablet; Take 1 tablet (100 mg total) by mouth daily.  Dispense: 30 tablet; Refill: 1 - Increase desvenlafaxine (PRISTIQ) 100 MG 24 hr tablet; Take 1 tablet (100 mg total) by mouth daily.  Dispense: 30 tablet; Refill: 1 - Start doxepin (SINEQUAN) 25 MG capsule; Take 1 capsule (25 mg total) by mouth at bedtime.  Dispense: 30 capsule; Refill: 1  2. Social anxiety disorder  - desvenlafaxine (PRISTIQ) 100 MG 24 hr tablet; Take 1 tablet (100 mg total) by mouth daily.  Dispense: 30 tablet; Refill: 1   Refer to in-house therapist for counseling. F/up in 6-7 weeks.  02/21/2021, MD 12/22/2020, 9:54 AM

## 2021-01-19 ENCOUNTER — Telehealth (HOSPITAL_COMMUNITY): Payer: Self-pay | Admitting: Psychiatry

## 2021-01-19 NOTE — Telephone Encounter (Signed)
Okay, I will see her for the final appt in early May and give her 3 month supply of meds.

## 2021-01-19 NOTE — Telephone Encounter (Signed)
Writer spoke with patient about transitioning to another facility that was in-network with insurance. Due to provider leaving facility and [place], Clinical research associate spoke with patient about completing a release of information for a referral to be sent to her facility of choice for psychiatry/med mgmt. Patient requested final appt with Dr. [name] & to have 3 months of med to allow transitioning time.Patient was understanding.

## 2021-01-27 ENCOUNTER — Other Ambulatory Visit: Payer: Self-pay

## 2021-01-27 ENCOUNTER — Encounter (INDEPENDENT_AMBULATORY_CARE_PROVIDER_SITE_OTHER): Payer: Self-pay | Admitting: Internal Medicine

## 2021-01-27 ENCOUNTER — Ambulatory Visit (INDEPENDENT_AMBULATORY_CARE_PROVIDER_SITE_OTHER): Payer: 59 | Admitting: Internal Medicine

## 2021-01-27 VITALS — BP 110/72 | HR 102 | Temp 97.8°F | Resp 17 | Ht 62.0 in | Wt 169.0 lb

## 2021-01-27 DIAGNOSIS — E039 Hypothyroidism, unspecified: Secondary | ICD-10-CM

## 2021-01-27 DIAGNOSIS — Z1231 Encounter for screening mammogram for malignant neoplasm of breast: Secondary | ICD-10-CM

## 2021-01-27 DIAGNOSIS — R5383 Other fatigue: Secondary | ICD-10-CM

## 2021-01-27 DIAGNOSIS — F4010 Social phobia, unspecified: Secondary | ICD-10-CM | POA: Diagnosis not present

## 2021-01-27 DIAGNOSIS — R5381 Other malaise: Secondary | ICD-10-CM

## 2021-01-27 DIAGNOSIS — G4700 Insomnia, unspecified: Secondary | ICD-10-CM

## 2021-01-27 DIAGNOSIS — E782 Mixed hyperlipidemia: Secondary | ICD-10-CM

## 2021-01-27 DIAGNOSIS — E559 Vitamin D deficiency, unspecified: Secondary | ICD-10-CM

## 2021-01-27 DIAGNOSIS — Z131 Encounter for screening for diabetes mellitus: Secondary | ICD-10-CM

## 2021-01-27 DIAGNOSIS — F520 Hypoactive sexual desire disorder: Secondary | ICD-10-CM

## 2021-01-27 MED ORDER — NP THYROID 60 MG PO TABS: 60.0000 mg | ORAL_TABLET | Freq: Every day | ORAL | 3 refills | Status: DC

## 2021-01-27 NOTE — Progress Notes (Signed)
Metrics: Intervention Frequency ACO  Documented Smoking Status Yearly  Screened one or more times in 24 months  Cessation Counseling or  Active cessation medication Past 24 months  Past 24 months   Guideline developer: UpToDate (See UpToDate for funding source) Date Released: 2014       Wellness Office Visit  Subjective:  Patient ID: Mary [name], female    DOB: 28-Oct-1965  Age: 55 y.o. MRN: [id number]  CC: This 55 year old lady comes to our practice as a new patient.  She was referred by pharmacist from [place]. HPI  She has a history of hypothyroidism.  She does not feel well.  She has extreme fatigue, weight gain. She also describes intermittent hot flashes and definitely decreased libido. She would like to feel better, have more energy, sleep better and lose weight. She also has dyslipidemia and previously was taking Crestor.  She did not like to take this and has stopped it. She has a history of depression for which she takes medications and sees psychiatry. She has vitamin D deficiency and intermittently takes vitamin D3 5000 units daily. Past Medical History:  Diagnosis Date  . Fibromyalgia   . Migraine   . Thyroid disease    Past Surgical History:  Procedure Laterality Date  . TONSILLECTOMY    . TUBAL LIGATION    . uterine ablation  2004   irregular heavy periods.     Family History  Problem Relation Age of Onset  . Heart disease Mother   . Depression Mother   . Heart disease Father   . Depression Father   . 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   Social History   Tobacco Use  . Smoking status: Former Smoker    Types: Cigarettes  . Smokeless tobacco: Never Used  . Tobacco comment: quit feb 2016  Substance Use Topics  . Alcohol use: No    Alcohol/week: 0.0 standard drinks    Current Meds  Medication  Sig  . Cholecalciferol (VITAMIN D3) 125 MCG (5000 UT) CAPS Take by mouth.  . desvenlafaxine (PRISTIQ) 100 MG 24 hr tablet Take 1 tablet (100 mg total) by mouth daily.  . fluticasone (FLONASE) 50 MCG/ACT nasal spray Place 2 sprays into both nostrils daily.  Marland Kitchen lamoTRIgine (LAMICTAL) 100 MG tablet Take 1 tablet (100 mg total) by mouth daily.  . NP THYROID 60 MG tablet Take 1 tablet (60 mg total) by mouth daily before breakfast.  . SYNTHROID 75 MCG tablet Take 1 tablet (75 mcg total) by mouth daily before breakfast.  . Turmeric (QC TUMERIC COMPLEX) 500 MG CAPS Take by mouth.     Flowsheet Row Video Visit from 12/22/2020 in [facility]  PHQ-9 Total Score 8      Objective:   Today's Vitals: BP 110/72 (BP Location: Left Arm, Patient Position: Sitting, Cuff Size: Normal)   Pulse (!) 102   Temp 97.8 F (36.6 C) (Temporal)   Resp 17   Ht 5\' 2"  (1.575 m)   Wt 169 lb (76.7 kg)   SpO2 97%   BMI 30.91 kg/m  Vitals with BMI 01/27/2021 10/28/2020 10/27/2020  Height 5\' 2"  - 5\' 2"   Weight 169 lbs - 167 lbs  BMI 30.9 - 30.54  Systolic 110 - 134  Diastolic 72 - 86  Pulse 102 100 90     Physical Exam   She  looks systemically well but is obese.  Blood pressure is acceptable.    Assessment   1. Social anxiety disorder   2. Acquired hypothyroidism   3. Mixed hyperlipidemia   4. Malaise and fatigue   5. Vitamin D deficiency disease   6. Insomnia, unspecified type   7. Hypoactive sexual desire disorder   8. Encounter for screening mammogram for malignant neoplasm of breast   9. Screening for diabetes mellitus       Tests ordered Orders Placed This Encounter  Procedures  . MM 3D SCREEN BREAST BILATERAL  . DHEA-sulfate  . Estradiol  . Progesterone  . T3, free  . T4, free  . TSH  . Testos,Total,Free and SHBG (Female)  . CBC  . COMPLETE METABOLIC PANEL WITH GFR  . Hemoglobin A1c  . Lipid panel     Plan: 1. I explained to her that Synthroid will not  be optimal treatment for her hypothyroidism and I am going to switch this to NP thyroid.  I explained possible side effects and how to deal with them and she is agreeable to start this medication. 2. Blood work is ordered. 3. I will see her in the next several weeks to discuss all the blood work in detail as well as start discussing nutrition, exercise and also more information on COVID-19 vaccine.  Unfortunately, we do not have time to discuss this today.   Meds ordered this encounter  Medications  . NP THYROID 60 MG tablet    Sig: Take 1 tablet (60 mg total) by mouth daily before breakfast.    Dispense:  30 tablet    Refill:  3    Therisa Mennella Normajean Glasgow, MD

## 2021-01-28 ENCOUNTER — Encounter (INDEPENDENT_AMBULATORY_CARE_PROVIDER_SITE_OTHER): Payer: Self-pay | Admitting: Internal Medicine

## 2021-01-28 ENCOUNTER — Encounter: Payer: Self-pay | Admitting: Family Medicine

## 2021-02-01 LAB — TESTOS,TOTAL,FREE AND SHBG (FEMALE)
Free Testosterone: 1.6 pg/mL (ref 0.1–6.4)
Sex Hormone Binding: 37 nmol/L (ref 17–124)
Testosterone, Total, LC-MS-MS: 10 ng/dL (ref 2–45)

## 2021-02-01 LAB — LIPID PANEL
Cholesterol: 215 mg/dL — ABNORMAL HIGH (ref <200)
HDL: 47 mg/dL — ABNORMAL LOW (ref >=50)
LDL Cholesterol (Calc): 147 mg/dL (calc) — ABNORMAL HIGH
Non-HDL Cholesterol (Calc): 168 mg/dL (calc) — ABNORMAL HIGH (ref <130)
Total CHOL/HDL Ratio: 4.6 (calc) (ref <5.0)
Triglycerides: 97 mg/dL (ref <150)

## 2021-02-01 LAB — HEMOGLOBIN A1C
Hgb A1c MFr Bld: 5.4 % of total Hgb (ref <5.7)
Mean Plasma Glucose: 108 mg/dL
eAG (mmol/L): 6 mmol/L

## 2021-02-01 LAB — ESTRADIOL: Estradiol: <15 pg/mL

## 2021-02-01 LAB — COMPLETE METABOLIC PANEL WITH GFR
AG Ratio: 2.2 (calc) (ref 1.0–2.5)
ALT: 10 U/L (ref 6–29)
AST: 13 U/L (ref 10–35)
Albumin: 4.7 g/dL (ref 3.6–5.1)
Alkaline phosphatase (APISO): 78 U/L (ref 37–153)
BUN: 19 mg/dL (ref 7–25)
CO2: 26 mmol/L (ref 20–32)
Calcium: 9.6 mg/dL (ref 8.6–10.4)
Chloride: 103 mmol/L (ref 98–110)
Creat: 0.68 mg/dL (ref 0.50–1.05)
GFR, Est African American: 116 mL/min/{1.73_m2} (ref >=60)
GFR, Est Non African American: 100 mL/min/{1.73_m2} (ref >=60)
Globulin: 2.1 g/dL (calc) (ref 1.9–3.7)
Glucose, Bld: 87 mg/dL (ref 65–139)
Potassium: 4.3 mmol/L (ref 3.5–5.3)
Sodium: 138 mmol/L (ref 135–146)
Total Bilirubin: 0.4 mg/dL (ref 0.2–1.2)
Total Protein: 6.8 g/dL (ref 6.1–8.1)

## 2021-02-01 LAB — CBC
HCT: 40 % (ref 35.0–45.0)
Hemoglobin: 13.6 g/dL (ref 11.7–15.5)
MCH: 31.6 pg (ref 27.0–33.0)
MCHC: 34 g/dL (ref 32.0–36.0)
MCV: 93 fL (ref 80.0–100.0)
MPV: 9.1 fL (ref 7.5–12.5)
Platelets: 348 10*3/uL (ref 140–400)
RBC: 4.3 10*6/uL (ref 3.80–5.10)
RDW: 12.5 % (ref 11.0–15.0)
WBC: 6.7 10*3/uL (ref 3.8–10.8)

## 2021-02-01 LAB — T4, FREE: Free T4: 1 ng/dL (ref 0.8–1.8)

## 2021-02-01 LAB — DHEA-SULFATE: DHEA-SO4: 78 ug/dL (ref 5–167)

## 2021-02-01 LAB — TSH: TSH: 0.93 mIU/L

## 2021-02-01 LAB — PROGESTERONE: Progesterone: <0.5 ng/mL

## 2021-02-01 LAB — T3, FREE: T3, Free: 3.1 pg/mL (ref 2.3–4.2)

## 2021-02-02 ENCOUNTER — Encounter (INDEPENDENT_AMBULATORY_CARE_PROVIDER_SITE_OTHER): Payer: Self-pay | Admitting: Internal Medicine

## 2021-02-12 ENCOUNTER — Telehealth (HOSPITAL_COMMUNITY): Payer: No Payment, Other | Admitting: Psychiatry

## 2021-02-23 ENCOUNTER — Telehealth (INDEPENDENT_AMBULATORY_CARE_PROVIDER_SITE_OTHER): Payer: 59 | Admitting: Psychiatry

## 2021-02-23 ENCOUNTER — Encounter (HOSPITAL_COMMUNITY): Payer: Self-pay | Admitting: Psychiatry

## 2021-02-23 ENCOUNTER — Other Ambulatory Visit: Payer: Self-pay

## 2021-02-23 DIAGNOSIS — F3181 Bipolar II disorder: Secondary | ICD-10-CM

## 2021-02-23 DIAGNOSIS — F4010 Social phobia, unspecified: Secondary | ICD-10-CM | POA: Diagnosis not present

## 2021-02-23 MED ORDER — DESVENLAFAXINE SUCCINATE ER 100 MG PO TB24: 100.0000 mg | ORAL_TABLET | Freq: Every day | ORAL | 2 refills | Status: DC

## 2021-02-23 MED ORDER — LAMOTRIGINE 100 MG PO TABS: 100.0000 mg | ORAL_TABLET | Freq: Every day | ORAL | 2 refills | Status: DC

## 2021-02-23 NOTE — Progress Notes (Signed)
BH MD/PA/NP OP Progress Note  Virtual Visit via Telephone Note  I connected with Mary [name] on 02/23/21 at  2:40 PM EDT by telephone and verified that I am speaking with the correct person using two identifiers.  Location: Patient: home Provider: Clinic   I discussed the limitations, risks, security and privacy concerns of performing an evaluation and management service by telephone and the availability of in person appointments. I also discussed with the patient that there may be a patient responsible charge related to this service. The patient expressed understanding and agreed to proceed.   I provided 15 minutes of non-face-to-face time during this encounter.     02/23/2021 2:53 PM Mary [name]  MRN:  [id number]  Chief Complaint:  "I am doing better now."  HPI: Patient informed that she is doing better.  She found increasing the dose of Pristiq to be helpful for her depressive symptoms.  She also recently started taking a different formulation of thyroid medicine and that has also helped her symptoms of fatigue and mood. She informed that she feels she is in a much better shape mentally and physically at present. She informed that she now has insurance through her new job. She denied any other stressors or issues today.  Writer informed her that the Clinical research associate is leaving [place] [facility] and asked if she would like to be referred to the [facility] psychiatry clinic, patient stated that she would prefer to see a psychiatrist in [place] and asked for a few options locally.   Visit Diagnosis:    ICD-10-CM   1. Bipolar 2 disorder (HCC)  F31.81   2. Social anxiety disorder  F40.10     Past Psychiatric History: Depression, anxiety  Past Medical History:  Past Medical History:  Diagnosis Date  . Fibromyalgia   . Migraine   . Thyroid disease     Past Surgical History:  Procedure Laterality Date  . TONSILLECTOMY    . TUBAL LIGATION    . uterine ablation   2004   irregular heavy periods.    Family Psychiatric History: Maternal grandmother and mother depression/anxiety. Paternal uncle committed suicide. Patient's daughter 45 years old-reported struggling with substance abuse and was recently released from jail.   Family History:  Family History  Problem Relation Age of Onset  . Heart disease Mother   . Depression Mother   . Heart disease Father   . Depression Father   . Depression Maternal Uncle     Social History:  Social History   Socioeconomic History  . Marital status: Married    Spouse name: Not on file  . Number of children: Not on file  . Years of education: Not on file  . Highest education level: Not on file  Occupational History  . Not on file  Tobacco Use  . Smoking status: Former Smoker    Types: Cigarettes  . Smokeless tobacco: Never Used  . Tobacco comment: quit feb 2016  Vaping Use  . Vaping Use: Never used  Substance and Sexual Activity  . Alcohol use: No    Alcohol/week: 0.0 standard drinks  . Drug use: No  . Sexual activity: Yes    Birth control/protection: Surgical  Other Topics Concern  . 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   Social Determinants of Health   Financial Resource Strain: Not on file  Food Insecurity: Not on file  Transportation Needs: Not on  file  Physical Activity: Not on file  Stress: Not on file  Social Connections: Not on file    Allergies:  Allergies  Allergen Reactions  . Augmentin [Amoxicillin-Pot Clavulanate]     swelling  . Bactrim [Sulfamethoxazole-Trimethoprim]     Lips swelling    Metabolic Disorder Labs: Lab Results  Component Value Date   HGBA1C 5.4 01/27/2021   MPG 108 01/27/2021   MPG 114 08/16/2019   No results found for: PROLACTIN Lab Results  Component Value Date   CHOL 215 (H) 01/27/2021   TRIG 97 01/27/2021   HDL 47 (L) 01/27/2021    CHOLHDL 4.6 01/27/2021   LDLCALC 147 (H) 01/27/2021   LDLCALC 137 (H) 02/27/2020   Lab Results  Component Value Date   TSH 0.93 01/27/2021   TSH 0.27 (L) 02/27/2020    Therapeutic Level Labs: No results found for: LITHIUM No results found for: VALPROATE No components found for:  CBMZ  Current Medications: Current Outpatient Medications  Medication Sig Dispense Refill  . Cholecalciferol (VITAMIN D3) 125 MCG (5000 UT) CAPS Take by mouth.    . desvenlafaxine (PRISTIQ) 100 MG 24 hr tablet Take 1 tablet (100 mg total) by mouth daily. 30 tablet 1  . fluticasone (FLONASE) 50 MCG/ACT nasal spray Place 2 sprays into both nostrils daily. 16 g 1  . lamoTRIgine (LAMICTAL) 100 MG tablet Take 1 tablet (100 mg total) by mouth daily. 30 tablet 1  . NP THYROID 60 MG tablet Take 1 tablet (60 mg total) by mouth daily before breakfast. 30 tablet 3  . SYNTHROID 75 MCG tablet Take 1 tablet (75 mcg total) by mouth daily before breakfast. 90 tablet 1  . Turmeric (QC TUMERIC COMPLEX) 500 MG CAPS Take by mouth.     No current facility-administered medications for this visit.      Psychiatric Specialty Exam: Review of Systems  There were no vitals taken for this visit.There is no height or weight on file to calculate BMI.  General Appearance: unable to assess due to phone visit  Eye Contact:  unable to assess due to phone visit  Speech:  Clear and Coherent and Normal Rate  Volume:  Normal  Mood:  Euthymic  Affect:  Congruent  Thought Process:  Goal Directed, Linear and Descriptions of Associations: Intact  Orientation:  Full (Time, Place, and Person)  Thought Content: Logical   Suicidal Thoughts:  No  Homicidal Thoughts:  No  Memory:  Recent;   Good Remote;   Good  Judgement:  Good  Insight:  Good  Psychomotor Activity:  Normal  Concentration:  Concentration: Good and Attention Span: Good  Recall:  Good  Fund of Knowledge: Good  Language: Good  Akathisia:  Negative  Handed:  Right   AIMS (if indicated): not done  Assets:  Communication Skills Desire for Improvement Financial Resources/Insurance Housing  ADL's:  Intact  Cognition: WNL  Sleep:  Good     Screenings: GAD-7   Flowsheet Row Office Visit from 12/18/2018 in [facility]  Total GAD-7 Score 9    PHQ2-9   Flowsheet Row Video Visit from 12/22/2020 in [facility] Counselor from 12/16/2019 in [facility] Counselor from 11/29/2019 in [facility] Office Visit from 12/18/2018 in [facility]  PHQ-2 Total Score 2 3 6 3   PHQ-9 Total Score 8 16 24 14     Flowsheet Row Video Visit from 12/22/2020 in [facility]  C-SSRS  RISK CATEGORY No Risk       Assessment and Plan: Patient appears to be stable in terms of her psychiatry symptoms at present.  She would like to continue same regimen of Lamictal and Pristiq.  She does not need doxepin on a regular basis and does not need any more refills for that.  1. Bipolar 2 disorder (HCC)  - lamoTRIgine (LAMICTAL) 100 MG tablet; Take 1 tablet (100 mg total) by mouth daily.  Dispense: 30 tablet; Refill: 1 - Increase desvenlafaxine (PRISTIQ) 100 MG 24 hr tablet; Take 1 tablet (100 mg total) by mouth daily.  Dispense: 30 tablet; Refill: 1 - doxepin (SINEQUAN) 25 MG capsule; Take 1 capsule (25 mg total) by mouth at bedtime PRN.  Dispense: 30 capsule; Refill: 1  2. Social anxiety disorder  - desvenlafaxine (PRISTIQ) 100 MG 24 hr tablet; Take 1 tablet (100 mg total) by mouth daily.  Dispense: 30 tablet; Refill: 1   Continue same medication regimen. Follow up in 3 months.  Provider informed the patient that provider is leaving [facility] health psychiatry.  Patient asked for any other psychiatry providers in [place], Clinical research associate gave her a few names and she informed that she is going to contact them for an appointment. Writer  informed her that Clinical research associate is sending 3 months prescriptions so she has 3 months time to find somebody.  Patient verbalized her understanding.    Zena Amos, MD 02/23/2021, 2:53 PM

## 2021-03-03 NOTE — Telephone Encounter (Signed)
error 

## 2021-03-04 ENCOUNTER — Other Ambulatory Visit (HOSPITAL_COMMUNITY): Payer: Self-pay | Admitting: Psychiatry

## 2021-03-04 DIAGNOSIS — F4010 Social phobia, unspecified: Secondary | ICD-10-CM

## 2021-03-04 DIAGNOSIS — F3181 Bipolar II disorder: Secondary | ICD-10-CM

## 2021-03-11 ENCOUNTER — Ambulatory Visit (INDEPENDENT_AMBULATORY_CARE_PROVIDER_SITE_OTHER): Payer: 59 | Admitting: Internal Medicine

## 2021-03-11 ENCOUNTER — Encounter (INDEPENDENT_AMBULATORY_CARE_PROVIDER_SITE_OTHER): Payer: Self-pay | Admitting: Internal Medicine

## 2021-03-11 ENCOUNTER — Other Ambulatory Visit: Payer: Self-pay

## 2021-03-11 VITALS — BP 130/70 | HR 88 | Temp 97.3°F | Resp 18 | Ht 62.0 in | Wt 165.7 lb

## 2021-03-11 DIAGNOSIS — E782 Mixed hyperlipidemia: Secondary | ICD-10-CM

## 2021-03-11 DIAGNOSIS — G4700 Insomnia, unspecified: Secondary | ICD-10-CM

## 2021-03-11 DIAGNOSIS — E669 Obesity, unspecified: Secondary | ICD-10-CM

## 2021-03-11 DIAGNOSIS — E039 Hypothyroidism, unspecified: Secondary | ICD-10-CM

## 2021-03-11 NOTE — Progress Notes (Signed)
Metrics: Intervention Frequency ACO  Documented Smoking Status Yearly  Screened one or more times in 24 months  Cessation Counseling or  Active cessation medication Past 24 months  Past 24 months   Guideline developer: UpToDate (See UpToDate for funding source) Date Released: 2014       Wellness Office Visit  Subjective:  Patient ID: Mary [name], female    DOB: June 05, 1966  Age: 55 y.o. MRN: [id number]  CC: This lady comes in for follow-up regarding her blood work and further recommendations. HPI  She already feels better on NP thyroid.  She has lost some weight. I discussed all her blood work with her which shows dyslipidemia, suboptimal hormones including estradiol, progesterone and testosterone.  She is not diabetic. Past Medical History:  Diagnosis Date  . Fibromyalgia   . Migraine   . Thyroid disease    Past Surgical History:  Procedure Laterality Date  . TONSILLECTOMY    . TUBAL LIGATION    . uterine ablation  2004   irregular heavy periods.     Family History  Problem Relation Age of Onset  . Heart disease Mother   . Depression Mother   . Heart disease Father   . Depression Father   . 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   Social History   Tobacco Use  . Smoking status: Former Smoker    Types: Cigarettes  . Smokeless tobacco: Never Used  . Tobacco comment: quit feb 2016  Substance Use Topics  . Alcohol use: No    Alcohol/week: 0.0 standard drinks    Current Meds  Medication Sig  . desvenlafaxine (PRISTIQ) 100 MG 24 hr tablet Take 1 tablet by mouth once daily  . fluticasone (FLONASE) 50 MCG/ACT nasal spray Place 2 sprays into both nostrils daily.  Marland Kitchen lamoTRIgine (LAMICTAL) 100 MG tablet Take 1 tablet (100 mg total) by mouth daily.  . NP THYROID 60 MG tablet Take 1 tablet (60 mg total) by mouth daily  before breakfast.  . Turmeric 500 MG CAPS Take by mouth.     Flowsheet Row Office Visit from 12/18/2018 in [facility]  PHQ-9 Total Score 14      Objective:   Today's Vitals: BP 130/70 (BP Location: Left Arm, Patient Position: Sitting, Cuff Size: Normal)   Pulse 88   Temp (!) 97.3 F (36.3 C) (Temporal)   Resp 18   Ht 5\' 2"  (1.575 m)   Wt 165 lb 11.2 oz (75.2 kg)   SpO2 99%   BMI 30.31 kg/m  Vitals with BMI 03/11/2021 01/27/2021 10/28/2020  Height 5\' 2"  5\' 2"  -  Weight 165 lbs 11 oz 169 lbs -  BMI 30.3 30.9 -  Systolic 130 110 -  Diastolic 70 72 -  Pulse 88 102 100     Physical Exam  Although she remains obese, she has lost 4 pounds since last visit.  Blood pressure is acceptable.     Assessment   1. Acquired hypothyroidism   2. Mixed hyperlipidemia   3. Insomnia, unspecified type   4. Obesity (BMI 30-39.9)       Tests ordered Orders Placed This Encounter  Procedures  . T3, free  . TSH     Plan: 1. She will continue with NP thyroid 60 mg daily and we will check thyroid function today.  We may need to adjust upwards. 2. Today  we spent most of the visit discussing nutrition and in particular intermittent fasting combined with a plant-based diet, ensuring adequate hydration. 3. I will see him in about a month's time to see how she is doing and we will probably start talking about bioidentical hormones at that point.  I spent 30 minutes with this patient discussing nutrition today.   No orders of the defined types were placed in this encounter.   Wilson Singer, MD

## 2021-03-11 NOTE — Patient Instructions (Signed)
[facility] Dietary Recommendations for Weight Loss What to Avoid . Avoid added sugars o Often added sugar can be found in processed foods such as many condiments, dry cereals, cakes, cookies, chips, crisps, crackers, candies, sweetened drinks, etc.  o Read labels and AVOID/DECREASE use of foods with the following in their ingredient list: Sugar, fructose, high fructose corn syrup, sucrose, glucose, maltose, dextrose, molasses, cane sugar, brown sugar, any type of syrup, agave nectar, etc.   . Avoid snacking in between meals . Avoid foods made with flour o If you are going to eat food made with flour, choose those made with whole-grains; and, minimize your consumption as much as is tolerable . Avoid processed foods o These foods are generally stocked in the middle of the grocery store. Focus on shopping on the perimeter of the grocery.  . Avoid Meat  o We recommend following a plant-based diet at [facility]. Thus, we recommend avoiding meat as a general rule. Consider eating beans, legumes, eggs, and/or dairy products for regular protein sources o If you plan on eating meat limit to 4 ounces of meat at a time and choose lean options such as Fish, chicken, turkey. Avoid red meat intake such as pork and/or steak What to Include . Vegetables o GREEN LEAFY VEGETABLES: Kale, spinach, mustard greens, collard greens, cabbage, broccoli, etc. o OTHER: Asparagus, cauliflower, eggplant, carrots, peas, Brussel sprouts, tomatoes, bell peppers, zucchini, beets, cucumbers, etc. . Grains, seeds, and legumes o Beans: kidney beans, black eyed peas, garbanzo beans, black beans, pinto beans, etc. o Whole, unrefined grains: brown rice, barley, bulgur, oatmeal, etc. . Healthy fats  o Avoid highly processed fats such as vegetable oil o Examples of healthy fats: avocado, olives, virgin olive oil, dark chocolate (?72% Cocoa), nuts (peanuts, almonds, walnuts, cashews, pecans, etc.) . None to Low  Intake of Animal Sources of Protein o Meat sources: chicken, turkey, salmon, tuna. Limit to 4 ounces of meat at one time. o Consider limiting dairy sources, but when choosing dairy focus on: PLAIN Greek yogurt, cottage cheese, high-protein milk . Fruit o Choose berries  When to Eat . Intermittent Fasting: o Choosing not to eat for a specific time period, but DO FOCUS ON HYDRATION when fasting o Multiple Techniques: - Time Restricted Eating: eat 3 meals in a day, each meal lasting no more than 60 minutes, no snacks between meals - 16-18 hour fast: fast for 16 to 18 hours up to 7 days a week. Often suggested to start with 2-3 nonconsecutive days per week.  . Remember the time you sleep is counted as fasting.  . Examples of eating schedule: Fast from 7:00pm-11:00am. Eat between 11:00am-7:00pm.  - 24-hour fast: fast for 24 hours up to every other day. Often suggested to start with 1 day per week . Remember the time you sleep is counted as fasting . Examples of eating schedule:  o Eating day: eat 2-3 meals on your eating day. If doing 2 meals, each meal should last no more than 90 minutes. If doing 3 meals, each meal should last no more than 60 minutes. Finish last meal by 7:00pm. o Fasting day: Fast until 7:00pm.  o IF YOU FEEL UNWELL FOR ANY REASON/IN ANY WAY WHEN FASTING, STOP FASTING BY EATING A NUTRITIOUS SNACK OR LIGHT MEAL o ALWAYS FOCUS ON HYDRATION DURING FASTS - Acceptable Hydration sources: water, broths, tea/coffee (black tea/coffee is best but using a small amount of whole-fat dairy products in coffee/tea is acceptable).  -   Poor Hydration Sources: anything with sugar or artificial sweeteners added to it  These recommendations have been developed for patients that are actively receiving medical care from either Dr. Lipa Knauff or Sarah Gray, DNP, NP-C at [facility]. These recommendations are developed for patients with specific medical conditions and are not meant to be  distributed or used by others that are not actively receiving care from either provider listed above at [facility]. It is not appropriate to participate in the above eating plans without proper medical supervision.   Reference: Fung, J. The obesity code. [place]: [place]; 2016.   

## 2021-03-11 NOTE — Progress Notes (Signed)
Want to see about coming off the 2 med's. Lamitical & pristiq? So she feels dehydrate. And at time feels funny in her head.

## 2021-03-12 LAB — TSH: TSH: 0.56 mIU/L

## 2021-03-12 LAB — T3, FREE: T3, Free: 3.4 pg/mL (ref 2.3–4.2)

## 2021-03-15 ENCOUNTER — Ambulatory Visit
Admission: EM | Admit: 2021-03-15 | Discharge: 2021-03-15 | Disposition: A | Discharge status: Home / Self Care | Payer: 59 | Attending: Family Medicine | Admitting: Family Medicine

## 2021-03-15 ENCOUNTER — Other Ambulatory Visit: Payer: Self-pay

## 2021-03-15 DIAGNOSIS — H66001 Acute suppurative otitis media without spontaneous rupture of ear drum, right ear: Secondary | ICD-10-CM | POA: Diagnosis not present

## 2021-03-15 MED ORDER — FLUCONAZOLE 150 MG PO TABS: ORAL_TABLET | ORAL | 0 refills | Status: DC

## 2021-03-15 MED ORDER — CEFDINIR 300 MG PO CAPS: 300.0000 mg | ORAL_CAPSULE | Freq: Two times a day (BID) | ORAL | 0 refills | Status: AC

## 2021-03-15 NOTE — ED Triage Notes (Signed)
Pt presents with sore throat and left ear pain that began yesterday

## 2021-03-15 NOTE — Discharge Instructions (Signed)
I have sent in Cefdinir for you to take twice a day for 10 days.  I have sent in fluconazole in case of yeast. Take one tablet at the onset of symptoms. If symptoms are still present in 3 days, take the second tablet.   Follow up with this office or with primary care if symptoms are persisting.  Follow up in the ER for high fever, trouble swallowing, trouble breathing, other concerning symptoms.

## 2021-03-15 NOTE — ED Provider Notes (Signed)
[facility]    CSN: [id number] Arrival date & time: 03/15/21  0803      History   Chief Complaint Chief Complaint  Patient presents with  . Sore Throat  . Otalgia    HPI Mary [name] is a 55 y.o. female.   Reports left ear pain, fever and sore throat since yesterday. Reports that her grandson is sick as well. Has positive hx Covid. Has not completed Covid vaccines. Has not completed flu vaccine. Has not attempted OTC treatment. There are not aggravating or alleviating factors. Denies chills, body aches, abdominal pain, nausea, vomiting, diarrhea, rash, other symptoms.  ROS per  HPI  The history is provided by the patient.  Sore Throat  Otalgia   Past Medical History:  Diagnosis Date  . Fibromyalgia   . Migraine   . Thyroid disease     Patient Active Problem List   Diagnosis Date Noted  . Sore throat 10/14/2020  . Exposure to COVID-19 virus 10/14/2020  . Acute bacterial rhinosinusitis 10/14/2020  . Mouth pain 10/14/2020  . Genital herpes 05/13/2020  . Social anxiety disorder 02/26/2020  . Bipolar 2 disorder (HCC) 01/09/2020  . Anxiety 01/09/2020  . Mixed hyperlipidemia 02/14/2019  . Hypothyroidism 09/26/2018  . Goiter 09/26/2018  . Palpitations 09/25/2015  . Adjustment disorder with mixed anxiety and depressed mood 08/03/2014  . Fibromyalgia affecting multiple sites 05/28/2014    Past Surgical History:  Procedure Laterality Date  . TONSILLECTOMY    . TUBAL LIGATION    . uterine ablation  2004   irregular heavy periods.    OB History   No obstetric history on file.      Home Medications    Prior to Admission medications   Medication Sig Start Date End Date Taking? Authorizing Provider  cefdinir (OMNICEF) 300 MG capsule Take 1 capsule (300 mg total) by mouth 2 (two) times daily for 10 days. 03/15/21 03/25/21 Yes Moshe Cipro, NP  fluconazole (DIFLUCAN) 150 MG tablet Take one tablet at the onset of symptoms. If symptoms are  still present 3 days later, take the second tablet. 03/15/21  Yes Moshe Cipro, NP  Cholecalciferol (VITAMIN D3) 125 MCG (5000 UT) CAPS Take by mouth. Patient not taking: Reported on 03/11/2021    [provider]  desvenlafaxine (PRISTIQ) 100 MG 24 hr tablet Take 1 tablet by mouth once daily 03/04/21   Zena Amos, MD  fluticasone [facility]) 50 MCG/ACT nasal spray Place 2 sprays into both nostrils daily. 10/28/20   Annalee Genta, DO  lamoTRIgine (LAMICTAL) 100 MG tablet Take 1 tablet (100 mg total) by mouth daily. 02/23/21   Zena Amos, MD  NP THYROID 60 MG tablet Take 1 tablet (60 mg total) by mouth daily before breakfast. 01/27/21   Wilson Singer, MD  Turmeric 500 MG CAPS Take by mouth.    [provider]    Family History Family History  Problem Relation Age of Onset  . Heart disease Mother   . Depression Mother   . Heart disease Father   . Depression Father   . Depression Maternal Uncle     Social History Social History   Tobacco Use  . Smoking status: Former Smoker    Types: Cigarettes  . Smokeless tobacco: Never Used  . Tobacco comment: quit feb 2016  Vaping Use  . Vaping Use: Never used  Substance Use Topics  . Alcohol use: No    Alcohol/week: 0.0 standard drinks  . Drug use: No  Allergies   Augmentin [amoxicillin-pot clavulanate] and Bactrim [sulfamethoxazole-trimethoprim]   Review of Systems Review of Systems  HENT: Positive for ear pain.      Physical Exam Triage Vital Signs ED Triage Vitals  Enc Vitals Group     BP 03/15/21 0819 115/80     Pulse Rate 03/15/21 0819 91     Resp 03/15/21 0819 20     Temp 03/15/21 0819 98.1 F (36.7 C)     Temp src --      SpO2 03/15/21 0819 96 %     Weight --      Height --      Head Circumference --      Peak Flow --      Pain Score 03/15/21 0815 7     Pain Loc --      Pain Edu? --      Excl. in GC? --    No data found.  Updated Vital Signs BP 115/80   Pulse 91   Temp 98.1  F (36.7 C)   Resp 20   SpO2 96%   Visual Acuity Right Eye Distance:   Left Eye Distance:   Bilateral Distance:    Right Eye Near:   Left Eye Near:    Bilateral Near:     Physical Exam Vitals and nursing note reviewed.  Constitutional:      General: She is not in acute distress.    Appearance: She is well-developed and normal weight. She is ill-appearing.  HENT:     Head: Normocephalic and atraumatic.     Right Ear: Ear canal normal. A middle ear effusion is present. Tympanic membrane is erythematous.     Left Ear: Ear canal normal. A middle ear effusion is present.     Nose: Congestion present.     Mouth/Throat:     Mouth: Mucous membranes are moist.     Comments: Cobblestoning present  Eyes:     Conjunctiva/sclera: Conjunctivae normal.  Cardiovascular:     Rate and Rhythm: Normal rate and regular rhythm.     Heart sounds: Normal heart sounds. No murmur heard.   Pulmonary:     Effort: Pulmonary effort is normal. No respiratory distress.     Breath sounds: Normal breath sounds.  Abdominal:     Palpations: Abdomen is soft.     Tenderness: There is no abdominal tenderness.  Musculoskeletal:     Cervical back: Neck supple.  Skin:    General: Skin is warm and dry.     Capillary Refill: Capillary refill takes less than 2 seconds.  Neurological:     General: No focal deficit present.     Mental Status: She is alert and oriented to person, place, and time.  Psychiatric:        Mood and Affect: Mood normal.        Behavior: Behavior normal.      UC Treatments / Results  Labs (all labs ordered are listed, but only abnormal results are displayed) Labs Reviewed - No data to display  EKG   Radiology No results found.  Procedures Procedures (including critical care time)  Medications Ordered in UC Medications - No data to display  Initial Impression / Assessment and Plan / UC Course  I have reviewed the triage vital signs and the nursing  notes.  Pertinent labs & imaging results that were available during my care of the patient were reviewed by me and considered in my medical decision making (see chart  for details).    Right Otitis Media  Prescribed Cefdinir 300mg  BID x 10 days Prescribed fluconazole in case of yeast Push fluids and get rest Take antibiotic as directed and to completion Work note provided Follow up with this office or with primary care if symptoms are persisting.  Follow up in the ER for high fever, trouble swallowing, trouble breathing, other concerning symptoms.   Final Clinical Impressions(s) / UC Diagnoses   Final diagnoses:  Non-recurrent acute suppurative otitis media of right ear without spontaneous rupture of tympanic membrane     Discharge Instructions     I have sent in Cefdinir for you to take twice a day for 10 days.  I have sent in fluconazole in case of yeast. Take one tablet at the onset of symptoms. If symptoms are still present in 3 days, take the second tablet.   Follow up with this office or with primary care if symptoms are persisting.  Follow up in the ER for high fever, trouble swallowing, trouble breathing, other concerning symptoms.      ED Prescriptions    Medication Sig Dispense Auth. Provider   cefdinir (OMNICEF) 300 MG capsule Take 1 capsule (300 mg total) by mouth 2 (two) times daily for 10 days. 20 capsule , NP   fluconazole (DIFLUCAN) 150 MG tablet Take one tablet at the onset of symptoms. If symptoms are still present 3 days later, take the second tablet. 2 tablet Moshe Cipro, NP     PDMP not reviewed this encounter.   Moshe Cipro, NP 03/15/21 [id number]

## 2021-03-16 ENCOUNTER — Encounter (INDEPENDENT_AMBULATORY_CARE_PROVIDER_SITE_OTHER): Payer: Self-pay | Admitting: Internal Medicine

## 2021-03-16 ENCOUNTER — Other Ambulatory Visit: Payer: Self-pay

## 2021-03-16 ENCOUNTER — Ambulatory Visit (INDEPENDENT_AMBULATORY_CARE_PROVIDER_SITE_OTHER): Payer: 59 | Admitting: Internal Medicine

## 2021-03-16 VITALS — BP 133/82 | HR 101 | Temp 97.1°F | Resp 18 | Ht 62.0 in | Wt 163.0 lb

## 2021-03-16 DIAGNOSIS — H66002 Acute suppurative otitis media without spontaneous rupture of ear drum, left ear: Secondary | ICD-10-CM | POA: Diagnosis not present

## 2021-03-16 DIAGNOSIS — J0100 Acute maxillary sinusitis, unspecified: Secondary | ICD-10-CM | POA: Diagnosis not present

## 2021-03-16 MED ORDER — PREDNISONE 20 MG PO TABS: 40.0000 mg | ORAL_TABLET | Freq: Every day | ORAL | 1 refills | Status: DC

## 2021-03-16 MED ORDER — NEOMYCIN-POLYMYXIN-HC 1 % OT SOLN: 3.0000 [drp] | Freq: Four times a day (QID) | OTIC | 1 refills | Status: DC

## 2021-03-16 NOTE — Progress Notes (Signed)
Metrics: Intervention Frequency ACO  Documented Smoking Status Yearly  Screened one or more times in 24 months  Cessation Counseling or  Active cessation medication Past 24 months  Past 24 months   Guideline developer: UpToDate (See UpToDate for funding source) Date Released: 2014       Wellness Office Visit  Subjective:  Patient ID: Mary [name], female    DOB: [birth year]  Age: 55 y.o. MRN: [id number]  CC: Ear congestion, ear discomfort, sinus congestion. HPI  This lady comes in as an acute visit with a 3-day history of the above.  She did take a COVID-19 test today and it was negative.  Her grand baby had similar symptoms and was diagnosed with ear infection.  She has a minimal cough but there is no wheezing or breathing difficulties.  She denies any fever. Past Medical History:  Diagnosis Date  . Fibromyalgia   . Migraine   . Thyroid disease    Past Surgical History:  Procedure Laterality Date  . TONSILLECTOMY    . TUBAL LIGATION    . uterine ablation  2004   irregular heavy periods.     Family History  Problem Relation Age of Onset  . Heart disease Mother   . Depression Mother   . Heart disease Father   . Depression Father   . 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   Social History   Tobacco Use  . Smoking status: Former Smoker    Types: Cigarettes  . Smokeless tobacco: Never Used  . Tobacco comment: quit feb 2016  Substance Use Topics  . Alcohol use: No    Alcohol/week: 0.0 standard drinks    Current Meds  Medication Sig  . cefdinir (OMNICEF) 300 MG capsule Take 1 capsule (300 mg total) by mouth 2 (two) times daily for 10 days.  . Cholecalciferol (VITAMIN D3) 125 MCG (5000 UT) CAPS Take by mouth.  . desvenlafaxine (PRISTIQ) 100 MG 24 hr tablet Take 1 tablet by mouth once daily  . fluconazole (DIFLUCAN)  150 MG tablet Take one tablet at the onset of symptoms. If symptoms are still present 3 days later, take the second tablet.  . lamoTRIgine (LAMICTAL) 100 MG tablet Take 1 tablet (100 mg total) by mouth daily.  . NEOMYCIN-POLYMYXIN-HYDROCORTISONE (CORTISPORIN) 1 % SOLN OTIC solution Place 3 drops into both ears 4 (four) times daily.  . NP THYROID 60 MG tablet Take 1 tablet (60 mg total) by mouth daily before breakfast.  . predniSONE (DELTASONE) 20 MG tablet Take 2 tablets (40 mg total) by mouth daily with breakfast.  . Turmeric 500 MG CAPS Take by mouth.     Flowsheet Row Office Visit from 12/18/2018 in [facility]  PHQ-9 Total Score 14      Objective:   Today's Vitals: BP 133/82 (BP Location: Right Arm, Patient Position: Sitting, Cuff Size: Normal)   Pulse (!) 101   Temp (!) 97.1 F (36.2 C)   Resp 18   Ht 5\' 2"  (1.575 m)   Wt 163 lb (73.9 kg)   SpO2 99%   BMI 29.81 kg/m  Vitals with BMI 03/16/2021 03/15/2021 03/11/2021  Height 5\' 2"  - 5\' 2"   Weight 163 lbs - 165 lbs 11 oz  BMI 29.81 - 30.3  Systolic 133 115 03/13/2021  Diastolic 82 80 70  Pulse 101 91 88     Physical  Exam  She looks systemically well.  She is afebrile.  Left tympanic membrane does look inflamed and congested.  She does definitely have frontal and maxillary sinus tenderness.     Assessment   1. Non-recurrent acute suppurative otitis media of left ear without spontaneous rupture of tympanic membrane   2. Acute non-recurrent maxillary sinusitis       Tests ordered No orders of the defined types were placed in this encounter.    Plan: 1. She is already on cephalosporin as she is allergic to Augmentin and Bactrim so this is a reasonable antibiotic of choice.  I am going to add prednisone to help with the allergic response and also local eardrops. 2. I recommended she repeat a COVID-19 test tomorrow and if it is positive, I can prescribe for her specific antiviral therapy.  She will let me  know.   Meds ordered this encounter  Medications  . predniSONE (DELTASONE) 20 MG tablet    Sig: Take 2 tablets (40 mg total) by mouth daily with breakfast.    Dispense:  10 tablet    Refill:  1  . NEOMYCIN-POLYMYXIN-HYDROCORTISONE (CORTISPORIN) 1 % SOLN OTIC solution    Sig: Place 3 drops into both ears 4 (four) times daily.    Dispense:  10 mL    Refill:  1    Merit Gadsby Normajean Glasgow, MD

## 2021-03-17 ENCOUNTER — Other Ambulatory Visit (INDEPENDENT_AMBULATORY_CARE_PROVIDER_SITE_OTHER): Payer: Self-pay | Admitting: Internal Medicine

## 2021-03-26 ENCOUNTER — Encounter: Payer: Self-pay | Admitting: Adult Health

## 2021-03-26 ENCOUNTER — Other Ambulatory Visit: Payer: Self-pay

## 2021-03-26 ENCOUNTER — Ambulatory Visit (INDEPENDENT_AMBULATORY_CARE_PROVIDER_SITE_OTHER): Payer: 59 | Admitting: Adult Health

## 2021-03-26 VITALS — BP 117/79 | HR 81 | Ht 62.0 in | Wt 164.0 lb

## 2021-03-26 DIAGNOSIS — F411 Generalized anxiety disorder: Secondary | ICD-10-CM

## 2021-03-26 DIAGNOSIS — F3181 Bipolar II disorder: Secondary | ICD-10-CM

## 2021-03-26 DIAGNOSIS — F4010 Social phobia, unspecified: Secondary | ICD-10-CM

## 2021-03-26 DIAGNOSIS — F331 Major depressive disorder, recurrent, moderate: Secondary | ICD-10-CM

## 2021-03-26 MED ORDER — DESVENLAFAXINE SUCCINATE ER 50 MG PO TB24: 50.0000 mg | ORAL_TABLET | Freq: Every day | ORAL | 2 refills | Status: DC

## 2021-03-26 MED ORDER — ARIPIPRAZOLE 2 MG PO TABS: 2.0000 mg | ORAL_TABLET | Freq: Every day | ORAL | 2 refills | Status: DC

## 2021-03-26 NOTE — Progress Notes (Signed)
[facility] MD/PA/NP Initial Note  03/26/2021 9:29 AM Mary [name]  MRN:  [id number]  Chief Complaint:   HPI:   Describes mood today as "so-so". Pleasant. Denies tearfulness. Mood symptoms - reports depression and irritability. Denies anxiety. Stating "I just feel blah". Feels tired - "I want to sleep all the time". Having to push herself to do anything. Started on Lamictall a year ago and feels like it has been helpful. Also taking Pristiq 100mg  at lunchtime daily. Feels like it is making her feel "bad:. Reporting headaches - brain zaps. Stating "I don't like the way it makes me feel" - would like to taper off of it. Is willing to try other medication to help stabilize current symptoms. Diagnosed with Fibromyalgia, DJD, and arthritis. Works full time in . Stable interest and motivation. Taking medications as prescribed.  Energy levels low. Active, does not have a regular exercise routine. Walking some days. Enjoys some usual interests and activities. Married x 24 years. Lives with husband. Has 3 grown children. Mother local. Spending time with family. Appetite adequate. Weight loss - 6 pounds - currently 164 pounds from 170 pounds. Sleeps well most nights. Averages 8 to 9 hours. Focus and concentration difficulties - "it's tough". Over the past month hasn't been feeling good - brainzaps. Completing tasks. Managing aspects of household.  Denies SI or HI.  Denies AH or VH. Taking vitamin supplements.   Previous medication trials: Wellbutrin XL, SSRI's, Cymbalta  Visit Diagnosis:    ICD-10-CM   1. Generalized anxiety disorder  F41.1   2. Bipolar 2 disorder (HCC)  F31.81 desvenlafaxine (PRISTIQ) 50 MG 24 hr tablet    ARIPiprazole (ABILIFY) 2 MG tablet  3. Social anxiety disorder  F40.10 desvenlafaxine (PRISTIQ) 50 MG 24 hr tablet    ARIPiprazole (ABILIFY) 2 MG tablet  4. Major depressive disorder, recurrent episode, moderate (HCC)  F33.1     Past Psychiatric History: Denies  psychiatric hospitalization.  Past Medical History:  Past Medical History:  Diagnosis Date  . Fibromyalgia   . Migraine   . Thyroid disease     Past Surgical History:  Procedure Laterality Date  . TONSILLECTOMY    . TUBAL LIGATION    . uterine ablation  2004   irregular heavy periods.    Family Psychiatric History: Family history of mental illness.  Family History:  Family History  Problem Relation Age of Onset  . Heart disease Mother   . Depression Mother   . Heart disease Father   . Depression Father   . Depression Maternal Uncle     Social History:  Social History   Socioeconomic History  . Marital status: Married    Spouse name: Not on file  . Number of children: Not on file  . Years of education: Not on file  . Highest education level: Not on file  Occupational History  . Not on file  Tobacco Use  . Smoking status: Former Smoker    Types: Cigarettes  . Smokeless tobacco: Never Used  . Tobacco comment: quit feb 2016  Vaping Use  . Vaping Use: Never used  Substance and Sexual Activity  . Alcohol use: No    Alcohol/week: 0.0 standard drinks  . Drug use: No  . Sexual activity: Yes    Birth control/protection: Surgical  Other Topics Concern  . 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   Social Determinants of [employer]  Strain: Not on file  Food Insecurity: Not on file  Transportation Needs: Not on file  Physical Activity: Not on file  Stress: Not on file  Social Connections: Not on file    Allergies:  No Known Allergies  Metabolic Disorder Labs: Lab Results  Component Value Date   HGBA1C 5.4 01/27/2021   MPG 108 01/27/2021   MPG 114 08/16/2019   No results found for: PROLACTIN Lab Results  Component Value Date   CHOL 215 (H) 01/27/2021   TRIG 97 01/27/2021   HDL 47 (L) 01/27/2021   CHOLHDL 4.6 01/27/2021   LDLCALC  147 (H) 01/27/2021   LDLCALC 137 (H) 02/27/2020   Lab Results  Component Value Date   TSH 0.56 03/11/2021   TSH 0.93 01/27/2021    Therapeutic Level Labs: No results found for: LITHIUM No results found for: VALPROATE No components found for:  CBMZ  Current Medications: Current Outpatient Medications  Medication Sig Dispense Refill  . ARIPiprazole (ABILIFY) 2 MG tablet Take 1 tablet (2 mg total) by mouth daily. 30 tablet 2  . desvenlafaxine (PRISTIQ) 50 MG 24 hr tablet Take 1 tablet (50 mg total) by mouth daily. 30 tablet 2  . Cholecalciferol (VITAMIN D3) 125 MCG (5000 UT) CAPS Take by mouth.    . fluconazole (DIFLUCAN) 150 MG tablet Take one tablet at the onset of symptoms. If symptoms are still present 3 days later, take the second tablet. 2 tablet 0  . lamoTRIgine (LAMICTAL) 100 MG tablet Take 1 tablet (100 mg total) by mouth daily. 30 tablet 2  . NP THYROID 60 MG tablet Take 1 tablet (60 mg total) by mouth daily before breakfast. 30 tablet 3  . Turmeric 500 MG CAPS Take by mouth.     No current facility-administered medications for this visit.    Medication Side Effects: none  Orders placed this visit:  No orders of the defined types were placed in this encounter.   Psychiatric Specialty Exam:  Review of Systems  Musculoskeletal: Negative for gait problem.  Neurological: Negative for tremors.  Psychiatric/Behavioral:       Please refer to HPI    Blood pressure 117/79, pulse 81, height 5\' 2"  (1.575 m), weight 164 lb (74.4 kg).Body mass index is 30 kg/m.  General Appearance: Casual and Neat  Eye Contact:  Good  Speech:  Clear and Coherent and Normal Rate  Volume:  Normal  Mood:  Depressed and Irritable  Affect:  Appropriate and Congruent  Thought Process:  Coherent and Descriptions of Associations: Intact  Orientation:  Full (Time, Place, and Person)  Thought Content: Logical   Suicidal Thoughts:  No  Homicidal Thoughts:  No  Memory:  WNL  Judgement:  Good   Insight:  Good  Psychomotor Activity:  Normal  Concentration:  Concentration: Difficulties  Recall:  Good  Fund of Knowledge: Good  Language: Good  Assets:  Communication Skills Desire for Improvement Financial Resources/Insurance Housing Intimacy Leisure Time Physical Health Resilience Social Support Talents/Skills Transportation Vocational/Educational  ADL's:  Intact  Cognition: WNL  Prognosis:  Good   Screenings:  GAD-7   Flowsheet Row Office Visit from 12/18/2018 in [facility]  Total GAD-7 Score 9    PHQ2-9   Flowsheet Row Office Visit from 12/18/2018 in [facility]  PHQ-2 Total Score 3  PHQ-9 Total Score 14    Flowsheet Row ED from 03/15/2021 in [facility] RISK CATEGORY No Risk  Receiving Psychotherapy: No   Treatment Plan/Recommendations:   Plan:  PDMP reviewed  1. Lamictal 100mg  daily 2. Pristiq 100mg  daily - take 50mg  daily x 7 days, then 25mg  daily x 7 days, then d/c. 3. Add Abilify 2mg  daily   Time spent with patient was 60 minutes. Greater than 50% of face to face time with patient was spent on counseling and coordination of care.      RTC 4 weeks  Patient advised to contact office with any questions, adverse effects, or acute worsening in signs and symptoms.    , NP

## 2021-04-07 ENCOUNTER — Ambulatory Visit (INDEPENDENT_AMBULATORY_CARE_PROVIDER_SITE_OTHER): Payer: 59 | Admitting: Internal Medicine

## 2021-04-07 ENCOUNTER — Other Ambulatory Visit: Payer: Self-pay

## 2021-04-07 ENCOUNTER — Encounter (INDEPENDENT_AMBULATORY_CARE_PROVIDER_SITE_OTHER): Payer: Self-pay | Admitting: Internal Medicine

## 2021-04-07 VITALS — BP 120/82 | HR 87 | Temp 97.5°F | Resp 17 | Ht 62.0 in | Wt 165.0 lb

## 2021-04-07 DIAGNOSIS — N951 Menopausal and female climacteric states: Secondary | ICD-10-CM

## 2021-04-07 DIAGNOSIS — F520 Hypoactive sexual desire disorder: Secondary | ICD-10-CM

## 2021-04-07 DIAGNOSIS — G4700 Insomnia, unspecified: Secondary | ICD-10-CM

## 2021-04-07 DIAGNOSIS — E039 Hypothyroidism, unspecified: Secondary | ICD-10-CM

## 2021-04-07 DIAGNOSIS — E782 Mixed hyperlipidemia: Secondary | ICD-10-CM

## 2021-04-07 MED ORDER — ESTRADIOL 1 MG PO TABS: 1.0000 mg | ORAL_TABLET | Freq: Every day | ORAL | 3 refills | Status: DC

## 2021-04-07 MED ORDER — NP THYROID 60 MG PO TABS: 60.0000 mg | ORAL_TABLET | Freq: Two times a day (BID) | ORAL | 3 refills | Status: DC

## 2021-04-07 MED ORDER — PROGESTERONE 200 MG PO CAPS: 200.0000 mg | ORAL_CAPSULE | Freq: Every evening | ORAL | 3 refills | Status: DC

## 2021-04-07 MED ORDER — TESTOSTERONE 1.62 % TD GEL: 5.0000 mg | Freq: Every day | TRANSDERMAL | 1 refills | Status: DC

## 2021-04-07 NOTE — Progress Notes (Signed)
Pap referall dr Dalbert Mayotte Tdap? [id number]?

## 2021-04-07 NOTE — Progress Notes (Signed)
Metrics: Intervention Frequency ACO  Documented Smoking Status Yearly  Screened one or more times in 24 months  Cessation Counseling or  Active cessation medication Past 24 months  Past 24 months   Guideline developer: UpToDate (See UpToDate for funding source) Date Released: 2014       Wellness Office Visit  Subjective:  Patient ID: Mary [name], female    DOB: 28-Apr-1966  Age: 55 y.o. MRN: [id number]  CC: This lady comes in for follow-up of her hypothyroidism and further symptoms regarding menopausal state. HPI  She has been feeling better with the NP thyroid 60 mg daily but wants to feel better as her energy levels are still relatively low. She also gets hot flashes, decreased libido, insomnia. Past Medical History:  Diagnosis Date   Fibromyalgia    Migraine    Thyroid disease    Past Surgical History:  Procedure Laterality Date   TONSILLECTOMY     TUBAL LIGATION     uterine ablation  2004   irregular heavy periods.     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   Social History   Tobacco Use   Smoking status: Former    Pack years: 0.00    Types: Cigarettes   Smokeless tobacco: Never   Tobacco comments:    quit feb 2016  Substance Use Topics   Alcohol use: No    Alcohol/week: 0.0 standard drinks    Current Meds  Medication Sig   ARIPiprazole (ABILIFY) 2 MG tablet Take 1 tablet (2 mg total) by mouth daily.   Cholecalciferol (VITAMIN D3) 125 MCG (5000 UT) CAPS Take by mouth.   desvenlafaxine (PRISTIQ) 50 MG 24 hr tablet Take 1 tablet (50 mg total) by mouth daily.   estradiol (ESTRACE) 1 MG tablet Take 1 tablet (1 mg total) by mouth daily.   fluconazole (DIFLUCAN) 150 MG  tablet Take one tablet at the onset of symptoms. If symptoms are still present 3 days later, take the second tablet.   lamoTRIgine (LAMICTAL) 100 MG tablet Take 1 tablet (100 mg total) by mouth daily.   progesterone (PROMETRIUM) 200 MG capsule Take 1 capsule (200 mg total) by mouth at bedtime.   Turmeric 500 MG CAPS Take by mouth.   [DISCONTINUED] NP THYROID 60 MG tablet Take 1 tablet (60 mg total) by mouth daily before breakfast.   Testosterone 1.62 % GEL Place 5 mg onto the skin daily. 2% cream     Flowsheet Row Office Visit from 04/07/2021 in [facility]  PHQ-9 Total Score 0       Objective:   Today's Vitals: BP 120/82 (BP Location: Right Arm, Patient Position: Sitting, Cuff Size: Normal)   Pulse 87   Temp (!) 97.5 F (36.4 C) (Temporal)   Resp 17   Ht 5\' 2"  (1.575 m)   Wt 165 lb (74.8 kg)   SpO2 98%   BMI 30.18 kg/m  Vitals with BMI 04/07/2021 03/26/2021 03/16/2021  Height 5\' 2"  5\' 2"  5\' 2"   Weight 165 lbs 164 lbs 163 lbs  BMI 30.17 29.99 29.81  Systolic 120 117 03/18/2021  Diastolic 82 79 82  Pulse 87 81 101     Physical Exam  She looks systemically well.  Weight is stable.  Blood pressure is in a good range.     Assessment   1. Acquired hypothyroidism   2. Mixed hyperlipidemia   3. Hypoactive sexual desire disorder   4. Insomnia, unspecified type   5. Hot flashes due to menopause       Tests ordered No orders of the defined types were placed in this encounter.    Plan: 1.  I think we can further optimize thyroid treatment and I wanted to increase the NP thyroid to 60 mg twice a day and I have sent a new prescription. 2.  We discussed bioidentical hormone therapy and the difference between synthetic and bioidentical.  After discussion and shared decision making, she is agreeable to trying estradiol and progesterone and I have sent prescriptions of this.  I have explained possible side effects also and how to deal with them. 3.  We also discussed  testosterone therapy and its benefits as well as side effects.  We will try her on testosterone cream since she also has vagina dryness and dyspareunia.  She will start taking testosterone 2% cream 1 click applied to the labia daily which is a dose of 5 mg daily. 4.  I will see her in about 4 to 6 weeks and we will do blood work then.    Meds ordered this encounter  Medications   NP THYROID 60 MG tablet    Sig: Take 1 tablet (60 mg total) by mouth 2 (two) times daily.    Dispense:  60 tablet    Refill:  3   estradiol (ESTRACE) 1 MG tablet    Sig: Take 1 tablet (1 mg total) by mouth daily.    Dispense:  30 tablet    Refill:  3   progesterone (PROMETRIUM) 200 MG capsule    Sig: Take 1 capsule (200 mg total) by mouth at bedtime.    Dispense:  30 capsule    Refill:  3   Testosterone 1.62 % GEL    Sig: Place 5 mg onto the skin daily. 2% cream    Dispense:  30 g    Refill:  1     Davari Lopes Normajean Glasgow, MD

## 2021-04-09 ENCOUNTER — Encounter (INDEPENDENT_AMBULATORY_CARE_PROVIDER_SITE_OTHER): Payer: Self-pay | Admitting: Internal Medicine

## 2021-04-09 NOTE — Telephone Encounter (Signed)
Please advise 

## 2021-04-14 ENCOUNTER — Ambulatory Visit (INDEPENDENT_AMBULATORY_CARE_PROVIDER_SITE_OTHER): Payer: 59 | Admitting: Internal Medicine

## 2021-04-14 ENCOUNTER — Telehealth: Payer: Self-pay | Admitting: Adult Health

## 2021-04-14 NOTE — Telephone Encounter (Signed)
Pt stated she is weaning off the pristiq and taking Abilify 2mg .She stated she is crying a lot ,sad and agitated as well as increased anxiety and decreased concentration.She wants to know if she should increase the Abilify or if anything can be done about these symptoms.

## 2021-04-14 NOTE — Telephone Encounter (Signed)
Pt experiencing intense anxiety, and concentration issues. Pt would like a call back to discuss.

## 2021-04-14 NOTE — Telephone Encounter (Signed)
Please add to cancellation list

## 2021-04-15 ENCOUNTER — Other Ambulatory Visit: Payer: Self-pay

## 2021-04-15 DIAGNOSIS — F4010 Social phobia, unspecified: Secondary | ICD-10-CM

## 2021-04-15 DIAGNOSIS — F3181 Bipolar II disorder: Secondary | ICD-10-CM

## 2021-04-15 MED ORDER — FLUOXETINE HCL 10 MG PO CAPS: 10.0000 mg | ORAL_CAPSULE | Freq: Every day | ORAL | 1 refills | Status: DC

## 2021-04-15 MED ORDER — ARIPIPRAZOLE 5 MG PO TABS: 5.0000 mg | ORAL_TABLET | Freq: Every day | ORAL | 2 refills | Status: DC

## 2021-04-15 NOTE — Telephone Encounter (Signed)
I would have her break a 50mg  in 1/2 x 7 days and then leave off.

## 2021-04-15 NOTE — Telephone Encounter (Signed)
We could add a low dose of Prozac to help with the withdrawal symptoms - can also increase Abilify to 5mg  if tolerating.

## 2021-04-15 NOTE — Telephone Encounter (Signed)
Pt called back and discussed medication taper, canceled apt and will have front office reschedule. Pt will call back with further issues.

## 2021-04-15 NOTE — Telephone Encounter (Signed)
Rtc to pt and she reports she was not tapering off of Pristiq like instructed, she was unclear of taper so she has been taking 50 mg every other day. How would you like her to continue her taper? Also she does agree to increase in Abilify and adding Prozac temporarily for withdrawal symptoms. I will cancel her apt for tomorrow and have her f/u in July.

## 2021-04-15 NOTE — Telephone Encounter (Signed)
Left detailed message with information but to call back with any questions. Will send in her Rx's   Advised front office to reschedule her apt for July.

## 2021-04-16 ENCOUNTER — Ambulatory Visit: Payer: 59 | Admitting: Adult Health

## 2021-04-23 ENCOUNTER — Ambulatory Visit: Payer: 59 | Admitting: Adult Health

## 2021-05-04 ENCOUNTER — Other Ambulatory Visit: Payer: Self-pay | Admitting: Obstetrics and Gynecology

## 2021-05-04 ENCOUNTER — Telehealth: Payer: Self-pay | Admitting: Adult Health

## 2021-05-04 DIAGNOSIS — N63 Unspecified lump in unspecified breast: Secondary | ICD-10-CM

## 2021-05-04 NOTE — Telephone Encounter (Signed)
Pt would like to know if she is suppose to continue taking Prozac or not? Please call [phone]

## 2021-05-05 NOTE — Telephone Encounter (Signed)
Pt informed

## 2021-05-05 NOTE — Telephone Encounter (Signed)
She can continue the Prozac for now. Will discuss further changes at next visit.

## 2021-05-05 NOTE — Telephone Encounter (Signed)
On 6/22 you prescribed Prozac to help with the withdrawal symptoms from her tapering off pristiq.She wants to know should she continue taking the prozac?She stated she thinks she needs something to help her concentrate/focus more

## 2021-05-06 ENCOUNTER — Ambulatory Visit
Admission: RE | Admit: 2021-05-06 | Discharge: 2021-05-06 | Disposition: A | Discharge status: Home / Self Care | Payer: 59 | Source: Ambulatory Visit | Attending: Obstetrics and Gynecology | Admitting: Obstetrics and Gynecology

## 2021-05-06 ENCOUNTER — Other Ambulatory Visit: Payer: Self-pay | Admitting: Obstetrics and Gynecology

## 2021-05-06 ENCOUNTER — Other Ambulatory Visit: Payer: Self-pay

## 2021-05-06 DIAGNOSIS — N63 Unspecified lump in unspecified breast: Secondary | ICD-10-CM

## 2021-05-07 ENCOUNTER — Other Ambulatory Visit: Payer: Self-pay | Admitting: Obstetrics and Gynecology

## 2021-05-07 DIAGNOSIS — Z8249 Family history of ischemic heart disease and other diseases of the circulatory system: Secondary | ICD-10-CM

## 2021-05-10 ENCOUNTER — Encounter (INDEPENDENT_AMBULATORY_CARE_PROVIDER_SITE_OTHER): Payer: Self-pay | Admitting: Internal Medicine

## 2021-05-11 ENCOUNTER — Encounter (INDEPENDENT_AMBULATORY_CARE_PROVIDER_SITE_OTHER): Payer: Self-pay | Admitting: Internal Medicine

## 2021-05-11 ENCOUNTER — Ambulatory Visit (INDEPENDENT_AMBULATORY_CARE_PROVIDER_SITE_OTHER): Payer: 59 | Admitting: Internal Medicine

## 2021-05-11 ENCOUNTER — Other Ambulatory Visit: Payer: Self-pay

## 2021-05-11 VITALS — BP 112/64 | HR 83 | Temp 97.5°F | Ht 62.0 in | Wt 166.4 lb

## 2021-05-11 DIAGNOSIS — F520 Hypoactive sexual desire disorder: Secondary | ICD-10-CM

## 2021-05-11 DIAGNOSIS — N951 Menopausal and female climacteric states: Secondary | ICD-10-CM | POA: Diagnosis not present

## 2021-05-11 DIAGNOSIS — E039 Hypothyroidism, unspecified: Secondary | ICD-10-CM

## 2021-05-11 DIAGNOSIS — E782 Mixed hyperlipidemia: Secondary | ICD-10-CM | POA: Diagnosis not present

## 2021-05-11 DIAGNOSIS — Z1159 Encounter for screening for other viral diseases: Secondary | ICD-10-CM

## 2021-05-11 NOTE — Progress Notes (Signed)
Metrics: Intervention Frequency ACO  Documented Smoking Status Yearly  Screened one or more times in 24 months  Cessation Counseling or  Active cessation medication Past 24 months  Past 24 months   Guideline developer: UpToDate (See UpToDate for funding source) Date Released: 2014       Wellness Office Visit  Subjective:  Patient ID: Mary [name], female    DOB: Feb 28, 1966  Age: 55 y.o. MRN: [id number]  CC: This lady comes in for follow-up of hypothyroidism, dyslipidemia, menopausal symptoms, testosterone therapy. HPI  Since starting testosterone therapy, she feels also much improved now in terms of energy, sex drive.  Estradiol and progesterone have helped with hot flashes which are very minimal now. The main difficulty she is having is of losing weight. Also, her gynecologist recommended estradiol patch instead of oral estradiol.  She wanted to discuss this. Past Medical History:  Diagnosis Date   Fibromyalgia    Migraine    Thyroid disease    Past Surgical History:  Procedure Laterality Date   TONSILLECTOMY     TUBAL LIGATION     uterine ablation  2004   irregular heavy periods.     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   Social History   Tobacco Use   Smoking status: Former    Types: Cigarettes   Smokeless tobacco: Never   Tobacco comments:    quit feb 2016  Substance Use Topics   Alcohol use: No    Alcohol/week: 0.0 standard drinks    Current Meds  Medication Sig   ARIPiprazole (ABILIFY) 5 MG tablet Take 1 tablet (5 mg total) by mouth daily.   Ascorbic Acid (VITAMIN C) 1000 MG tablet Vitamin C   Cholecalciferol (VITAMIN D3) 125 MCG (5000 UT) CAPS Take by mouth.    estradiol (ESTRACE) 1 MG tablet Take 1 tablet (1 mg total) by mouth daily.   FLUoxetine (PROZAC) 10 MG capsule Take 1 capsule (10 mg total) by mouth daily.   fluticasone (FLONASE) 50 MCG/ACT nasal spray fluticasone propionate 50 mcg/actuation nasal spray,suspension  USE 2 SPRAY(S) IN EACH NOSTRIL ONCE DAILY   lamoTRIgine (LAMICTAL) 100 MG tablet Take 1 tablet (100 mg total) by mouth daily.   NON FORMULARY tumeric-ging-olive-oreg-capryl   NP THYROID 60 MG tablet Take 1 tablet (60 mg total) by mouth 2 (two) times daily.   progesterone (PROMETRIUM) 200 MG capsule Take 1 capsule (200 mg total) by mouth at bedtime.   Testosterone 1.62 % GEL Place 5 mg onto the skin daily. 2% cream   Turmeric 500 MG CAPS Take by mouth.   valACYclovir (VALTREX) 500 MG tablet Take 500 mg by mouth 2 (two) times daily.     Flowsheet Row Office Visit from 04/07/2021 in [facility]  PHQ-9 Total Score 0       Objective:   Today's Vitals: BP 112/64   Pulse 83   Temp (!) 97.5 F (36.4 C) (Temporal)   Ht 5\' 2"  (1.575 m)   Wt 166 lb 6.4 oz (75.5 kg)   SpO2 98%   BMI 30.43 kg/m  Vitals with BMI 05/11/2021 04/07/2021 03/16/2021  Height 5\' 2"  5\' 2"  5\' 2"   Weight 166 lbs 6  oz 165 lbs 163 lbs  BMI 30.43 30.17 29.81  Systolic 112 120 606  Diastolic 64 82 82  Pulse 83 87 101  Some encounter information is confidential and restricted. Go to Review Flowsheets activity to see all data.     Physical Exam  She remains obese and really has not lost any significant weight.     Assessment   1. Acquired hypothyroidism   2. Mixed hyperlipidemia   3. Hypoactive sexual desire disorder   4. Hot flashes due to menopause   5. Encounter for hepatitis C screening test for low risk patient       Tests ordered Orders Placed This Encounter  Procedures   DHEA-sulfate   Estradiol   Progesterone   T3, free   T4, free   TSH   Testos,Total,Free and SHBG (Female)   Hepatitis C antibody      Plan: 1.   Continue with the same dose of NP thyroid 60 mg twice a day although she is having difficulty maintaining it twice a day all the time but we will check levels. 2.  Continue with estradiol, progesterone and testosterone at current doses and we will check levels.  I discussed the importance of oral estradiol in coronary artery disease versus transdermal estradiol based on the medical literature.  After a full discussion, she is agreeable to continue with oral estradiol. 3.  I discussed nutrition again and the importance of intermittent fasting and recommended apple cider vinegar with her water which will help her to suppress appetite for longer and hopefully she can fast longer. 4.  I will see her in about 2 to 3 months time for an annual physical exam.    No orders of the defined types were placed in this encounter.   Wilson Singer, MD

## 2021-05-12 ENCOUNTER — Encounter: Payer: Self-pay | Admitting: Adult Health

## 2021-05-12 ENCOUNTER — Ambulatory Visit (INDEPENDENT_AMBULATORY_CARE_PROVIDER_SITE_OTHER): Payer: 59 | Admitting: Adult Health

## 2021-05-12 ENCOUNTER — Encounter (INDEPENDENT_AMBULATORY_CARE_PROVIDER_SITE_OTHER): Payer: Self-pay | Admitting: Internal Medicine

## 2021-05-12 ENCOUNTER — Telehealth: Payer: Self-pay | Admitting: Adult Health

## 2021-05-12 DIAGNOSIS — F3181 Bipolar II disorder: Secondary | ICD-10-CM | POA: Diagnosis not present

## 2021-05-12 DIAGNOSIS — F411 Generalized anxiety disorder: Secondary | ICD-10-CM | POA: Diagnosis not present

## 2021-05-12 DIAGNOSIS — F331 Major depressive disorder, recurrent, moderate: Secondary | ICD-10-CM | POA: Diagnosis not present

## 2021-05-12 DIAGNOSIS — F4010 Social phobia, unspecified: Secondary | ICD-10-CM | POA: Diagnosis not present

## 2021-05-12 MED ORDER — DULOXETINE HCL 30 MG PO CPEP: 30.0000 mg | ORAL_CAPSULE | Freq: Two times a day (BID) | ORAL | 2 refills | Status: DC

## 2021-05-12 NOTE — Telephone Encounter (Signed)
Pt called and said that she took a genesight test . Cymbalta is a drug interaction for her. So she needs something diffferent..Please call her to discuss [phone]

## 2021-05-12 NOTE — Progress Notes (Signed)
Mary [name] [id number] Dec 18, 1965 55 y.o.  Subjective:   Patient ID:  Mary [name] is a 55 y.o. (DOB Jan 08, 1966) female.  Chief Complaint: No chief complaint on file.   HPI Mary [name] presents to the office today for follow-up of MDD, GAD,and  SAD.  Describes mood today as "so-so". Pleasant. Denies tearfulness. Mood symptoms - denies depression. Decreased irritability. Denies anxiety. Stating "I'm doing better than I was". Has tapered off the Pristiq - brain zaps are almost completely gone. Also stating "it was a tough three weeks". Not having to push herself as much. Diagnosed with Fibromyalgia, DJD, and arthritis. Working with [employer] for back issues. Works full time in Audiological scientist. Stable interest and motivation. Taking medications as prescribed.  Energy levels low - "not as bad". Active, does not have a regular exercise routine.  Enjoys some usual interests and activities. Married x 24 years. Lives with husband. Has 3 grown children. Mother local. Spending time with family. Appetite adequate. Weight loss - 166 pounds.  Sleeps well most nights. Averages 8 to 9 hours. Focus and concentration difficulties. Completing tasks. Managing aspects of household.  Denies SI or HI.  Denies AH or VH. Taking vitamin supplements.   Previous medication trials: Wellbutrin XL, SSRI's, Cymbalta   GAD-7    Flowsheet Row Office Visit from 12/18/2018 in [facility]  Total GAD-7 Score 9      PHQ2-9    Flowsheet Row Office Visit from 04/07/2021 in [facility] Office Visit from 12/18/2018 in [facility]  PHQ-2 Total Score 0 3  PHQ-9 Total Score 0 14      Flowsheet Row ED from 03/15/2021 in [facility] RISK CATEGORY No Risk        Review of Systems:  Review of Systems  Musculoskeletal:  Negative for gait problem.  Neurological:  Negative for tremors.  Psychiatric/Behavioral:          Please refer to HPI   Medications: I have reviewed the patient's current medications.  Current Outpatient Medications  Medication Sig Dispense Refill   DULoxetine (CYMBALTA) 30 MG capsule Take 1 capsule (30 mg total) by mouth 2 (two) times daily. 60 capsule 2   ARIPiprazole (ABILIFY) 5 MG tablet Take 1 tablet (5 mg total) by mouth daily. 30 tablet 2   Ascorbic Acid (VITAMIN C) 1000 MG tablet Vitamin C     Cholecalciferol (VITAMIN D3) 125 MCG (5000 UT) CAPS Take by mouth.     estradiol (ESTRACE) 1 MG tablet Take 1 tablet (1 mg total) by mouth daily. 30 tablet 3   fluticasone (FLONASE) 50 MCG/ACT nasal spray fluticasone propionate 50 mcg/actuation nasal spray,suspension  USE 2 SPRAY(S) IN EACH NOSTRIL ONCE DAILY     lamoTRIgine (LAMICTAL) 100 MG tablet Take 1 tablet (100 mg total) by mouth daily. 30 tablet 2   NON FORMULARY tumeric-ging-olive-oreg-capryl     NP THYROID 60 MG tablet Take 1 tablet (60 mg total) by mouth 2 (two) times daily. 60 tablet 3   progesterone (PROMETRIUM) 200 MG capsule Take 1 capsule (200 mg total) by mouth at bedtime. 30 capsule 3   Testosterone 1.62 % GEL Place 5 mg onto the skin daily. 2% cream 30 g 1   Turmeric 500 MG CAPS Take by mouth.     valACYclovir (VALTREX) 500 MG tablet Take 500 mg by mouth 2 (two) times daily.     No current facility-administered medications for this visit.  Medication Side Effects: None  Allergies: No Known Allergies  Past Medical History:  Diagnosis Date   Fibromyalgia    Migraine    Thyroid disease     Past Medical History, Surgical history, Social history, and Family history were reviewed and updated as appropriate.   Please see review of systems for further details on the patient's review from today.   Objective:   Physical Exam:  There were no vitals taken for this visit.  Physical Exam Constitutional:      General: She is not in acute distress. Musculoskeletal:        General: No deformity.  Neurological:      Mental Status: She is alert and oriented to person, place, and time.     Coordination: Coordination normal.  Psychiatric:        Attention and Perception: Attention and perception normal. She does not perceive auditory or visual hallucinations.        Mood and Affect: Mood normal. Mood is not anxious or depressed. Affect is not labile, blunt, angry or inappropriate.        Speech: Speech normal.        Behavior: Behavior normal.        Thought Content: Thought content normal. Thought content is not paranoid or delusional. Thought content does not include homicidal or suicidal ideation. Thought content does not include homicidal or suicidal plan.        Cognition and Memory: Cognition and memory normal.        Judgment: Judgment normal.     Comments: Insight intact    Lab Review:     Component Value Date/Time   NA 138 01/27/2021 1750   NA 140 04/19/2018 0830   K 4.3 01/27/2021 1750   CL 103 01/27/2021 1750   CO2 26 01/27/2021 1750   GLUCOSE 87 01/27/2021 1750   BUN 19 01/27/2021 1750   BUN 17 04/19/2018 0830   CREATININE 0.68 01/27/2021 1750   CALCIUM 9.6 01/27/2021 1750   PROT 6.8 01/27/2021 1750   PROT 6.7 04/19/2018 0830   ALBUMIN 4.5 04/19/2018 0830   AST 13 01/27/2021 1750   ALT 10 01/27/2021 1750   ALKPHOS 79 04/19/2018 0830   BILITOT 0.4 01/27/2021 1750   BILITOT 0.3 04/19/2018 0830   GFRNONAA 100 01/27/2021 1750   GFRAA 116 01/27/2021 1750       Component Value Date/Time   WBC 6.7 01/27/2021 1750   RBC 4.30 01/27/2021 1750   HGB 13.6 01/27/2021 1750   HGB 13.2 04/19/2018 0830   HCT 40.0 01/27/2021 1750   HCT 38.9 04/19/2018 0830   PLT 348 01/27/2021 1750   PLT 311 04/19/2018 0830   MCV 93.0 01/27/2021 1750   MCV 93 04/19/2018 0830   MCH 31.6 01/27/2021 1750   MCHC 34.0 01/27/2021 1750   RDW 12.5 01/27/2021 1750   RDW 13.0 04/19/2018 0830   LYMPHSABS 2.1 03/19/2019 1156   LYMPHSABS 2.1 04/19/2018 0830   MONOABS 0.5 03/19/2019 1156   EOSABS 0.1  03/19/2019 1156   EOSABS 0.1 04/19/2018 0830   BASOSABS 0.0 03/19/2019 1156   BASOSABS 0.0 04/19/2018 0830    No results found for: POCLITH, LITHIUM   No results found for: PHENYTOIN, PHENOBARB, VALPROATE, CBMZ   .res Assessment: Plan:    Plan:  PDMP reviewed:  Abilify 5mg  daily Lamictal 100mg  daily D/C Pristiq 100mg  daily - has tapered off successfully. D/C Prozac 10mg  from to 20mg   Add Cymbalta 30mg  BID  RTC 4 weeks  Patient advised to contact office with any questions, adverse effects, or acute worsening in signs and symptoms.  Discussed potential metabolic side effects associated with atypical antipsychotics, as well as potential risk for movement side effects. Advised pt to contact office if movement side effects occur.    Counseled patient regarding potential benefits, risks, and side effects of Lamictal to include potential risk of Stevens-Johnson syndrome. Advised patient to stop taking Lamictal and contact office immediately if rash develops and to seek urgent medical attention if rash is severe and/or spreading quickly.  Diagnoses and all orders for this visit:  Bipolar 2 disorder (HCC) -     DULoxetine (CYMBALTA) 30 MG capsule; Take 1 capsule (30 mg total) by mouth 2 (two) times daily.  Generalized anxiety disorder  Major depressive disorder, recurrent episode, moderate (HCC)  Social anxiety disorder    Please see After Visit Summary for patient specific instructions.  Future Appointments  Date Time Provider Department Center  05/25/2021  3:40 PM [facility] 1 [facility]  06/29/2021  1:00 PM Shevy Yaney, Thereasa Solo, NP [facility] None  08/24/2021  8:00 AM Lilly Cove C, MD [facility] None  11/10/2021 10:30 AM [facility] [place] 1 [facility]    No orders of the defined types were placed in this encounter.   -------------------------------

## 2021-05-13 NOTE — Telephone Encounter (Signed)
She will get results faxed over.She stated she still has Prozac and is doing well on it.Do you want her to continue taking it in the meantime?

## 2021-05-13 NOTE — Telephone Encounter (Signed)
No, I do not

## 2021-05-13 NOTE — Telephone Encounter (Signed)
Noted. Please advise next step.

## 2021-05-13 NOTE — Telephone Encounter (Signed)
Do you have a copy of her test?

## 2021-05-13 NOTE — Telephone Encounter (Signed)
Mary [name], can you ask pt about Almira Coaster getting a copy to review or maybe pt would prefer to set up apt to change her medication. The [facility] was not done with [place] so we do not have a copy

## 2021-05-14 ENCOUNTER — Other Ambulatory Visit: Payer: Self-pay

## 2021-05-14 MED ORDER — FLUOXETINE HCL 20 MG PO CAPS: 20.0000 mg | ORAL_CAPSULE | Freq: Every day | ORAL | 0 refills | Status: DC

## 2021-05-14 NOTE — Telephone Encounter (Signed)
Yes, we can just stick with that and increase dose if she would like.

## 2021-05-14 NOTE — Telephone Encounter (Signed)
Ok to send in the 20mg  capsule.

## 2021-05-14 NOTE — Telephone Encounter (Signed)
Rx sent 

## 2021-05-14 NOTE — Telephone Encounter (Signed)
She currently takes 10 mg and has about 20 left.She will like to increase dose.She is using [facility] in [place]

## 2021-05-15 LAB — T3, FREE: T3, Free: 4.4 pg/mL — ABNORMAL HIGH (ref 2.3–4.2)

## 2021-05-15 LAB — T4, FREE: Free T4: 1 ng/dL (ref 0.8–1.8)

## 2021-05-15 LAB — ESTRADIOL: Estradiol: 43 pg/mL

## 2021-05-15 LAB — TESTOS,TOTAL,FREE AND SHBG (FEMALE)
Free Testosterone: 2.7 pg/mL (ref 0.1–6.4)
Sex Hormone Binding: 63 nmol/L (ref 17–124)
Testosterone, Total, LC-MS-MS: 30 ng/dL (ref 2–45)

## 2021-05-15 LAB — HEPATITIS C ANTIBODY
Hepatitis C Ab: NONREACTIVE
SIGNAL TO CUT-OFF: 0.01 (ref <1.00)

## 2021-05-15 LAB — TSH: TSH: 0.22 mIU/L — ABNORMAL LOW

## 2021-05-15 LAB — DHEA-SULFATE: DHEA-SO4: 95 ug/dL (ref 5–167)

## 2021-05-15 LAB — PROGESTERONE: Progesterone: 6.2 ng/mL

## 2021-05-17 ENCOUNTER — Other Ambulatory Visit: Payer: Self-pay

## 2021-05-17 ENCOUNTER — Other Ambulatory Visit (INDEPENDENT_AMBULATORY_CARE_PROVIDER_SITE_OTHER): Payer: Self-pay | Admitting: Internal Medicine

## 2021-05-17 ENCOUNTER — Encounter (INDEPENDENT_AMBULATORY_CARE_PROVIDER_SITE_OTHER): Payer: Self-pay | Admitting: Internal Medicine

## 2021-05-17 ENCOUNTER — Ambulatory Visit
Admission: RE | Admit: 2021-05-17 | Discharge: 2021-05-17 | Disposition: A | Discharge status: Home / Self Care | Payer: 59 | Source: Ambulatory Visit | Attending: Obstetrics and Gynecology | Admitting: Obstetrics and Gynecology

## 2021-05-17 DIAGNOSIS — Z8249 Family history of ischemic heart disease and other diseases of the circulatory system: Secondary | ICD-10-CM

## 2021-05-17 MED ORDER — ESTRADIOL 1 MG PO TABS: 1.5000 mg | ORAL_TABLET | Freq: Every day | ORAL | 3 refills | Status: DC

## 2021-05-17 MED ORDER — PROGESTERONE 200 MG PO CAPS: 400.0000 mg | ORAL_CAPSULE | Freq: Every evening | ORAL | 3 refills | Status: DC

## 2021-05-25 ENCOUNTER — Other Ambulatory Visit: Payer: 59

## 2021-06-09 ENCOUNTER — Ambulatory Visit: Payer: 59 | Admitting: Adult Health

## 2021-06-18 ENCOUNTER — Other Ambulatory Visit: Payer: Self-pay | Admitting: Adult Health

## 2021-06-19 ENCOUNTER — Other Ambulatory Visit (HOSPITAL_COMMUNITY): Payer: Self-pay | Admitting: Psychiatry

## 2021-06-19 DIAGNOSIS — F3181 Bipolar II disorder: Secondary | ICD-10-CM

## 2021-06-21 ENCOUNTER — Other Ambulatory Visit: Payer: Self-pay

## 2021-06-21 ENCOUNTER — Telehealth: Payer: Self-pay | Admitting: Adult Health

## 2021-06-21 NOTE — Telephone Encounter (Signed)
Ok to send

## 2021-06-21 NOTE — Telephone Encounter (Signed)
Pt has been 3 days without Lamictal. Please send in today and send to [facility] in [place].

## 2021-06-21 NOTE — Telephone Encounter (Signed)
Rx sent 

## 2021-06-21 NOTE — Telephone Encounter (Signed)
Pt called and said Mary [name] forgot to send in her lamictal 100 mg1 at bedtime  to the [facility] pharmacy in [place]. The dr, who use to prescribed it left and gina said she would fill it. Pt has been out of this med for all weekend

## 2021-06-22 NOTE — Telephone Encounter (Signed)
Noted  

## 2021-06-23 NOTE — Telephone Encounter (Signed)
Message acknowledged and reviewed.

## 2021-06-29 ENCOUNTER — Telehealth: Payer: 59 | Admitting: Adult Health

## 2021-07-08 ENCOUNTER — Ambulatory Visit (INDEPENDENT_AMBULATORY_CARE_PROVIDER_SITE_OTHER): Payer: No Typology Code available for payment source | Admitting: Adult Health

## 2021-07-08 ENCOUNTER — Encounter: Payer: Self-pay | Admitting: Adult Health

## 2021-07-08 DIAGNOSIS — F3181 Bipolar II disorder: Secondary | ICD-10-CM

## 2021-07-08 DIAGNOSIS — F411 Generalized anxiety disorder: Secondary | ICD-10-CM

## 2021-07-08 DIAGNOSIS — F4010 Social phobia, unspecified: Secondary | ICD-10-CM | POA: Diagnosis not present

## 2021-07-08 MED ORDER — ARIPIPRAZOLE 5 MG PO TABS: 5.0000 mg | ORAL_TABLET | Freq: Every day | ORAL | 5 refills | Status: DC

## 2021-07-08 MED ORDER — GABAPENTIN 100 MG PO CAPS: 100.0000 mg | ORAL_CAPSULE | Freq: Three times a day (TID) | ORAL | 5 refills | Status: DC

## 2021-07-08 MED ORDER — LAMOTRIGINE 100 MG PO TABS: 100.0000 mg | ORAL_TABLET | Freq: Every day | ORAL | 5 refills | Status: DC

## 2021-07-08 MED ORDER — FLUOXETINE HCL 20 MG PO CAPS: 20.0000 mg | ORAL_CAPSULE | Freq: Every day | ORAL | 5 refills | Status: DC
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
## 2021-08-09 ENCOUNTER — Ambulatory Visit: Payer: Self-pay | Admitting: Family Medicine

## 2021-08-10 ENCOUNTER — Telehealth: Payer: Self-pay | Admitting: Family Medicine

## 2021-08-10 DIAGNOSIS — E039 Hypothyroidism, unspecified: Secondary | ICD-10-CM

## 2021-08-10 DIAGNOSIS — Z131 Encounter for screening for diabetes mellitus: Secondary | ICD-10-CM

## 2021-08-10 NOTE — Telephone Encounter (Signed)
Last labs completed by Dr.Gosrani: Hep C, Testosterone; T4, T4 Free, T3 Free, Progesterone, Estradiol. Please advise. Thank you

## 2021-08-10 NOTE — Telephone Encounter (Signed)
Patient has appointment 11/22. Needed lab orders and requested her A1C be checked.  CB#  [phone]

## 2021-08-11 NOTE — Telephone Encounter (Signed)
Lab orders placed and pt is aware 

## 2021-08-11 NOTE — Telephone Encounter (Signed)
Okay to order A1C and TSH, Free T4. No other labs at this time.  Dr. Adriana Simas

## 2021-08-24 ENCOUNTER — Encounter (INDEPENDENT_AMBULATORY_CARE_PROVIDER_SITE_OTHER): Payer: 59 | Admitting: Internal Medicine

## 2021-09-14 ENCOUNTER — Encounter: Payer: Self-pay | Admitting: Family Medicine

## 2021-09-14 ENCOUNTER — Ambulatory Visit (INDEPENDENT_AMBULATORY_CARE_PROVIDER_SITE_OTHER): Payer: No Typology Code available for payment source | Admitting: Family Medicine

## 2021-09-14 ENCOUNTER — Other Ambulatory Visit: Payer: Self-pay

## 2021-09-14 VITALS — BP 132/76 | HR 80 | Temp 98.1°F | Ht 62.0 in | Wt 175.2 lb

## 2021-09-14 DIAGNOSIS — J988 Other specified respiratory disorders: Secondary | ICD-10-CM | POA: Insufficient documentation

## 2021-09-14 MED ORDER — AMOXICILLIN-POT CLAVULANATE 875-125 MG PO TABS: 1.0000 | ORAL_TABLET | Freq: Two times a day (BID) | ORAL | 0 refills | Status: DC

## 2021-09-14 NOTE — Assessment & Plan Note (Signed)
This is likely viral in origin.  We had a discussion about this.  I advised supportive care and Zyrtec-D or Claritin-D, Pataday, Flonase. If symptoms persist, she may start the Augmentin that was prescribed (if this progresses to sinusitis).

## 2021-09-14 NOTE — Progress Notes (Signed)
Subjective:  Patient ID: Mary [name], female    DOB: April 14, 1966  Age: 55 y.o. MRN: [id number]  CC: Chief Complaint  Patient presents with   Establish Care    URI (covid neg per yesterday). Pt states she gets URI quite a bit in the winter. Drainage, back of throat sore from drainage, colored phelgm and sinus pressure    HPI:  55 year old female presents for evaluation of the above symptoms.  Patient states that she has been sick since yesterday.  She reports pain in the back of her throat, postnasal drip.  She also reports some head tightness and congestion.  She states that she has had several sick contacts in the office with similar symptoms.  Home COVID test negative.  Patient also states that she has watery eyes.  She states that she has a history of sinusitis and is concerned that she may need antibiotic therapy.  No other associated symptoms.  She has not taken any medication to alleviate her symptoms.  No other complaints at this time.  Patient Active Problem List   Diagnosis Date Noted   Respiratory infection 09/14/2021   Genital herpes 05/13/2020   Social anxiety disorder 02/26/2020   Bipolar 2 disorder (HCC) 01/09/2020   Anxiety 01/09/2020   Mixed hyperlipidemia 02/14/2019   Hypothyroidism 09/26/2018   Goiter 09/26/2018   Palpitations 09/25/2015   Adjustment disorder with mixed anxiety and depressed mood 08/03/2014   Fibromyalgia affecting multiple sites 05/28/2014    Social Hx   Social History   Socioeconomic History   Marital status: Married    Spouse name: Not on file   Number of children: Not on file   Years of education: Not on file   Highest education level: Not on file  Occupational History   Not on file  Tobacco Use   Smoking status: Former    Types: Cigarettes   Smokeless tobacco: Never   Tobacco comments:    quit feb 2016  Vaping Use   Vaping Use: Never used  Substance and Sexual Activity   Alcohol use: No    Alcohol/week: 0.0 standard  drinks   Drug use: No   Sexual activity: Yes    Birth control/protection: Surgical  Other Topics Concern   Not on file  Social History Narrative   Married since 1998.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   Social Determinants of Health   Financial Resource Strain: Not on file  Food Insecurity: Not on file  Transportation Needs: Not on file  Physical Activity: Not on file  Stress: Not on file  Social Connections: Not on file    Review of Systems Per HPI  Objective:  BP 132/76   Pulse 80   Temp 98.1 F (36.7 C)   Ht 5\' 2"  (1.575 m)   Wt 175 lb 3.2 oz (79.5 kg)   SpO2 99%   BMI 32.04 kg/m   BP/Weight 09/14/2021 05/11/2021 04/07/2021  Systolic BP 132 112 120  Diastolic BP 76 64 82  Wt. (Lbs) 175.2 166.4 165  BMI 32.04 30.43 30.18  Some encounter information is confidential and restricted. Go to Review Flowsheets activity to see all data.    Physical Exam Vitals and nursing note reviewed.  Constitutional:      General: She is not in acute distress.    Appearance: Normal appearance. She is not ill-appearing.  HENT:     Head: Normocephalic and atraumatic.     Right Ear: Tympanic membrane normal.  Left Ear: Tympanic membrane normal.     Mouth/Throat:     Pharynx: Oropharynx is clear. No oropharyngeal exudate.  Eyes:     General:        Right eye: No discharge.        Left eye: No discharge.     Conjunctiva/sclera: Conjunctivae normal.  Cardiovascular:     Rate and Rhythm: Normal rate and regular rhythm.     Heart sounds: No murmur heard. Pulmonary:     Effort: Pulmonary effort is normal.     Breath sounds: Normal breath sounds. No wheezing, rhonchi or rales.  Neurological:     Mental Status: She is alert.    Lab Results  Component Value Date   WBC 6.7 01/27/2021   HGB 13.6 01/27/2021   HCT 40.0 01/27/2021   PLT 348 01/27/2021   GLUCOSE 87 01/27/2021   CHOL 215 (H) 01/27/2021   TRIG 97 01/27/2021   HDL  47 (L) 01/27/2021   LDLCALC 147 (H) 01/27/2021   ALT 10 01/27/2021   AST 13 01/27/2021   NA 138 01/27/2021   K 4.3 01/27/2021   CL 103 01/27/2021   CREATININE 0.68 01/27/2021   BUN 19 01/27/2021   CO2 26 01/27/2021   TSH 0.22 (L) 05/11/2021   HGBA1C 5.4 01/27/2021     Assessment & Plan:   Problem List Items Addressed This Visit       Respiratory   Respiratory infection - Primary    This is likely viral in origin.  We had a discussion about this.  I advised supportive care and Zyrtec-D or Claritin-D, Pataday, Flonase. If symptoms persist, she may start the Augmentin that was prescribed (if this progresses to sinusitis).       Meds ordered this encounter  Medications   amoxicillin-clavulanate (AUGMENTIN) 875-125 MG tablet    Sig: Take 1 tablet by mouth 2 (two) times daily.    Dispense:  20 tablet    Refill:  0    Follow-up:  Return for Annually.  Everlene Other DO [facility]

## 2021-09-14 NOTE — Patient Instructions (Signed)
OTC Zyrtec D, Flonase, Pataday for your symptoms.  This is likely going to resolve without antibiotics.  If symptoms persist, you may start the antibiotic.  Take care  Dr. Adriana Simas

## 2021-10-04 ENCOUNTER — Telehealth: Payer: Self-pay | Admitting: Adult Health

## 2021-10-04 NOTE — Telephone Encounter (Signed)
Pt informed

## 2021-10-04 NOTE — Telephone Encounter (Signed)
Mary [name] called asking if she is still supposed to be on Lamictal 100 mg. It looks like she is but wanted to have a clinical person call her to confirm. [phone].

## 2021-11-10 ENCOUNTER — Ambulatory Visit
Admission: RE | Admit: 2021-11-10 | Discharge: 2021-11-10 | Disposition: A | Discharge status: Home / Self Care | Payer: PRIVATE HEALTH INSURANCE | Source: Ambulatory Visit | Attending: Obstetrics and Gynecology | Admitting: Obstetrics and Gynecology

## 2021-11-10 ENCOUNTER — Other Ambulatory Visit: Payer: Self-pay | Admitting: Obstetrics and Gynecology

## 2021-11-10 DIAGNOSIS — N63 Unspecified lump in unspecified breast: Secondary | ICD-10-CM

## 2021-11-26 ENCOUNTER — Encounter: Payer: No Typology Code available for payment source | Admitting: Nurse Practitioner

## 2021-12-01 ENCOUNTER — Encounter: Payer: Self-pay | Admitting: Adult Health

## 2021-12-01 ENCOUNTER — Other Ambulatory Visit: Payer: Self-pay

## 2021-12-01 ENCOUNTER — Ambulatory Visit (INDEPENDENT_AMBULATORY_CARE_PROVIDER_SITE_OTHER): Payer: No Typology Code available for payment source | Admitting: Adult Health

## 2021-12-01 DIAGNOSIS — F3181 Bipolar II disorder: Secondary | ICD-10-CM | POA: Diagnosis not present

## 2021-12-01 DIAGNOSIS — F4010 Social phobia, unspecified: Secondary | ICD-10-CM | POA: Diagnosis not present

## 2021-12-01 DIAGNOSIS — F411 Generalized anxiety disorder: Secondary | ICD-10-CM

## 2021-12-01 MED ORDER — ARIPIPRAZOLE 5 MG PO TABS: 5.0000 mg | ORAL_TABLET | Freq: Every day | ORAL | 5 refills | Status: DC

## 2021-12-01 MED ORDER — FLUOXETINE HCL 20 MG PO CAPS: 20.0000 mg | ORAL_CAPSULE | Freq: Every day | ORAL | 5 refills | Status: DC

## 2021-12-01 MED ORDER — BUPROPION HCL ER (XL) 150 MG PO TB24: 150.0000 mg | ORAL_TABLET | Freq: Every day | ORAL | 2 refills | Status: DC

## 2021-12-01 MED ORDER — LAMOTRIGINE 100 MG PO TABS: 100.0000 mg | ORAL_TABLET | Freq: Every day | ORAL | 5 refills | Status: DC

## 2021-12-01 NOTE — Progress Notes (Signed)
Mary [name] [id number] January 24, 1966 56 y.o.  Subjective:   Patient ID:  Mary [name] is a 56 y.o. (DOB [birth year]) female.  Chief Complaint: No chief complaint on file.   HPI KINZLEIGH ALTMAN presents to the office today for follow-up of MDD, GAD and SAD.  Describes mood today as "ok". Pleasant. Denies tearfulness. Not overly emotional or crying. Mood symptoms - reports depression. Denies anxiety and irritability. Stating "I feel stable". Feels like current medication regimen is working well. Reporting increased stressors with daughter moving back home - recovering addict. Trying to be supportive with recent relapse - had been clean x 2 years. Diagnosed with Fibromyalgia, DJD, and arthritis. Stable interest and motivation. Taking medications as prescribed.  Energy levels low. Active, does not have a regular exercise routine.  Enjoys some usual interests and activities. Married x 24 years. Lives with husband. Has 3 grown children. Mother local. Spending time with family. Appetite adequate. Weight gain - 166 to 172 pounds.  Sleeping better some nights than others. Averages 8 to 9 hours of broken sleep. Focus and concentration difficulties - "it could be better". Completing tasks. Managing aspects of household. Works full-time. Denies SI or HI.  Denies AH or VH. Taking vitamin supplements.   Previous medication trials: Wellbutrin XL, SSRI's, Cymbalta   GAD-7    Flowsheet Row Office Visit from 12/18/2018 in [facility]  Total GAD-7 Score 9      PHQ2-9    Flowsheet Row Office Visit from 04/07/2021 in [facility] Video Visit from 12/22/2020 in [facility] Counselor from 12/16/2019 in [facility] Counselor from 11/29/2019 in [facility] Office Visit from 12/18/2018 in [facility]  PHQ-2 Total Score 0 2 3 6 3   PHQ-9 Total Score 0 8 16  24 14       Flowsheet Row ED from 03/15/2021 in [facility] [place] Video Visit from 12/22/2020 in [facility]  C-SSRS RISK CATEGORY No Risk No Risk        Review of Systems:  Review of Systems  Musculoskeletal:  Negative for gait problem.  Neurological:  Negative for tremors.  Psychiatric/Behavioral:         Please refer to HPI   Medications: I have reviewed the patient's current medications.  Current Outpatient Medications  Medication Sig Dispense Refill   buPROPion (WELLBUTRIN XL) 150 MG 24 hr tablet Take 1 tablet (150 mg total) by mouth daily. 30 tablet 2   lamoTRIgine (LAMICTAL) 100 MG tablet Take 1 tablet (100 mg total) by mouth daily. 30 tablet 5   amoxicillin-clavulanate (AUGMENTIN) 875-125 MG tablet Take 1 tablet by mouth 2 (two) times daily. 20 tablet 0   ARIPiprazole (ABILIFY) 5 MG tablet Take 1 tablet (5 mg total) by mouth daily. 30 tablet 5   Ascorbic Acid (VITAMIN C) 1000 MG tablet Vitamin C     Cholecalciferol (VITAMIN D3) 125 MCG (5000 UT) CAPS Take by mouth.     FLUoxetine (PROZAC) 20 MG capsule Take 1 capsule (20 mg total) by mouth daily. 30 capsule 5   fluticasone (FLONASE) 50 MCG/ACT nasal spray fluticasone propionate 50 mcg/actuation nasal spray,suspension  USE 2 SPRAY(S) IN EACH NOSTRIL ONCE DAILY     NON FORMULARY tumeric-ging-olive-oreg-capryl     NP THYROID 60 MG tablet Take 1 tablet (60 mg total) by mouth 2 (two) times daily. 60 tablet 3   progesterone (PROMETRIUM) 200 MG capsule Take  2 capsules (400 mg total) by mouth at bedtime. 60 capsule 3   Testosterone 1.62 % GEL Place 5 mg onto the skin daily. 2% cream 30 g 1   Turmeric 500 MG CAPS Take by mouth.     valACYclovir (VALTREX) 500 MG tablet Take 500 mg by mouth 2 (two) times daily.     No current facility-administered medications for this visit.    Medication Side Effects: None  Allergies: No Known Allergies  Past Medical History:  Diagnosis Date    Fibromyalgia    Migraine    Thyroid disease     Past Medical History, Surgical history, Social history, and Family history were reviewed and updated as appropriate.   Please see review of systems for further details on the patient's review from today.   Objective:   Physical Exam:  There were no vitals taken for this visit.  Physical Exam Constitutional:      General: She is not in acute distress. Musculoskeletal:        General: No deformity.  Neurological:     Mental Status: She is alert and oriented to person, place, and time.     Coordination: Coordination normal.  Psychiatric:        Attention and Perception: Attention and perception normal. She does not perceive auditory or visual hallucinations.        Mood and Affect: Mood normal. Mood is not anxious or depressed. Affect is not labile, blunt, angry or inappropriate.        Speech: Speech normal.        Behavior: Behavior normal.        Thought Content: Thought content normal. Thought content is not paranoid or delusional. Thought content does not include homicidal or suicidal ideation. Thought content does not include homicidal or suicidal plan.        Cognition and Memory: Cognition and memory normal.        Judgment: Judgment normal.     Comments: Insight intact    Lab Review:     Component Value Date/Time   NA 138 01/27/2021 1750   NA 140 04/19/2018 0830   K 4.3 01/27/2021 1750   CL 103 01/27/2021 1750   CO2 26 01/27/2021 1750   GLUCOSE 87 01/27/2021 1750   BUN 19 01/27/2021 1750   BUN 17 04/19/2018 0830   CREATININE 0.68 01/27/2021 1750   CALCIUM 9.6 01/27/2021 1750   PROT 6.8 01/27/2021 1750   PROT 6.7 04/19/2018 0830   ALBUMIN 4.5 04/19/2018 0830   AST 13 01/27/2021 1750   ALT 10 01/27/2021 1750   ALKPHOS 79 04/19/2018 0830   BILITOT 0.4 01/27/2021 1750   BILITOT 0.3 04/19/2018 0830   GFRNONAA 100 01/27/2021 1750   GFRAA 116 01/27/2021 1750       Component Value Date/Time   WBC 6.7 01/27/2021  1750   RBC 4.30 01/27/2021 1750   HGB 13.6 01/27/2021 1750   HGB 13.2 04/19/2018 0830   HCT 40.0 01/27/2021 1750   HCT 38.9 04/19/2018 0830   PLT 348 01/27/2021 1750   PLT 311 04/19/2018 0830   MCV 93.0 01/27/2021 1750   MCV 93 04/19/2018 0830   MCH 31.6 01/27/2021 1750   MCHC 34.0 01/27/2021 1750   RDW 12.5 01/27/2021 1750   RDW 13.0 04/19/2018 0830   LYMPHSABS 2.1 03/19/2019 1156   LYMPHSABS 2.1 04/19/2018 0830   MONOABS 0.5 03/19/2019 1156   EOSABS 0.1 03/19/2019 1156   EOSABS 0.1 04/19/2018 0830  BASOSABS 0.0 03/19/2019 1156   BASOSABS 0.0 04/19/2018 0830    No results found for: POCLITH, LITHIUM   No results found for: PHENYTOIN, PHENOBARB, VALPROATE, CBMZ   .res Assessment: Plan:    Plan:  PDMP reviewed:  Abilify 5mg  daily Lamictal 100mg  daily Prozac 20mg  daily D/C Gabapentin 100mg  TID Add Wellbutrin XL 150mg  daily - denies seizure hx   RTC 4 weeks  Patient advised to contact office with any questions, adverse effects, or acute worsening in signs and symptoms.  Discussed potential metabolic side effects associated with atypical antipsychotics, as well as potential risk for movement side effects. Advised pt to contact office if movement side effects occur.    Counseled patient regarding potential benefits, risks, and side effects of Lamictal to include potential risk of Stevens-Johnson syndrome. Advised patient to stop taking Lamictal and contact office immediately if rash develops and to seek urgent medical attention if rash is severe and/or spreading quickly.    Diagnoses and all orders for this visit:  Generalized anxiety disorder -     buPROPion (WELLBUTRIN XL) 150 MG 24 hr tablet; Take 1 tablet (150 mg total) by mouth daily.  Bipolar 2 disorder (HCC) -     ARIPiprazole (ABILIFY) 5 MG tablet; Take 1 tablet (5 mg total) by mouth daily. -     FLUoxetine (PROZAC) 20 MG capsule; Take 1 capsule (20 mg total) by mouth daily. -     lamoTRIgine (LAMICTAL)  100 MG tablet; Take 1 tablet (100 mg total) by mouth daily. -     buPROPion (WELLBUTRIN XL) 150 MG 24 hr tablet; Take 1 tablet (150 mg total) by mouth daily.  Social anxiety disorder -     ARIPiprazole (ABILIFY) 5 MG tablet; Take 1 tablet (5 mg total) by mouth daily. -     FLUoxetine (PROZAC) 20 MG capsule; Take 1 capsule (20 mg total) by mouth daily. -     buPROPion (WELLBUTRIN XL) 150 MG 24 hr tablet; Take 1 tablet (150 mg total) by mouth daily.     Please see After Visit Summary for patient specific instructions.  Future Appointments  Date Time Provider Department Center  02/28/2022  5:00 PM Rhodie Cienfuegos, , NP [facility] None  03/04/2022  8:20 AM , NP [facility]  05/11/2022  7:40 AM [facility] 1 [facility]  05/11/2022  7:50 AM [facility] 05/04/2022 1 [facility]    No orders of the defined types were placed in this encounter.   -------------------------------

## 2021-12-24 ENCOUNTER — Telehealth: Payer: Self-pay | Admitting: Adult Health

## 2021-12-24 NOTE — Telephone Encounter (Signed)
Pt informed

## 2021-12-24 NOTE — Telephone Encounter (Signed)
Pt stated she satted she has not seen any benefit since starting the med and has become more irritable.She is taking 150 mg daily.Appt 5/8

## 2021-12-24 NOTE — Telephone Encounter (Signed)
Pt says she has been taking the Wellbutrin since 2/8 after her visit with Almira Coaster.  Her husband said she is more irritable and she's wonder if something else needs to be done. ? ?Next appt. 5/8 ?

## 2021-12-24 NOTE — Telephone Encounter (Signed)
Let's have her stop it and see how she does over the weekend. Have her call back next week with an update.

## 2021-12-29 ENCOUNTER — Telehealth: Payer: Self-pay | Admitting: Adult Health

## 2021-12-29 ENCOUNTER — Telehealth: Payer: Self-pay

## 2021-12-29 NOTE — Telephone Encounter (Signed)
Noted  

## 2021-12-29 NOTE — Telephone Encounter (Signed)
Pt called in wanting to talk about setting up a payment plan for outstanding balance. ? ?Cb#: [phone] ?

## 2021-12-29 NOTE — Telephone Encounter (Signed)
FYI

## 2021-12-29 NOTE — Telephone Encounter (Signed)
Mary [name] called this morning to report that she is feeling better since stopping the Wellbutrin. You had wanted to know how this would affect her.  Next appt 5/8 ?

## 2021-12-31 ENCOUNTER — Other Ambulatory Visit: Payer: Self-pay

## 2021-12-31 ENCOUNTER — Ambulatory Visit (INDEPENDENT_AMBULATORY_CARE_PROVIDER_SITE_OTHER): Payer: No Typology Code available for payment source | Admitting: Family Medicine

## 2021-12-31 DIAGNOSIS — M797 Fibromyalgia: Secondary | ICD-10-CM | POA: Diagnosis not present

## 2021-12-31 NOTE — Patient Instructions (Signed)
I have written the letter as requested. If there are issues, please let me know. ? ?Follow up in 6 months. ? ?Take care ? ?Dr. Lacinda Axon  ?

## 2021-12-31 NOTE — Assessment & Plan Note (Signed)
Patient requesting a note for her employer regarding accommodations for a standing desk.  I feel that this is reasonable.  Note was provided today.  Advised her to let me know if her employer has any issues or concerns regarding this. ?

## 2021-12-31 NOTE — Progress Notes (Signed)
? ?Subjective:  ?Patient ID: Mary [name], female    DOB: 11/19/1965  Age: 56 y.o. MRN: [id number] ? ?CC: ?Chief Complaint  ?Patient presents with  ? need note for desk riser  ?  Due to back problems.  Patient has fibromyalgia.   ? ? ?HPI: ? ?56 year old female presents for the above. ? ?Patient states that she needs a note so that she can give her employer to allow accommodation for a standing desk.  Patient suffers from fibromyalgia.  She has significant pain particularly in the upper back and shoulders.  Desk will help accommodate her and make her more comfortable at work and do her job more effectively.  No other complaints or concerns at this time. ? ?Patient Active Problem List  ? Diagnosis Date Noted  ? Respiratory infection 09/14/2021  ? Genital herpes 05/13/2020  ? Social anxiety disorder 02/26/2020  ? Bipolar 2 disorder (HCC) 01/09/2020  ? Anxiety 01/09/2020  ? Mixed hyperlipidemia 02/14/2019  ? Hypothyroidism 09/26/2018  ? Goiter 09/26/2018  ? Fibromyalgia affecting multiple sites 05/28/2014  ? ? ?Social Hx   ?Social History  ? ?Socioeconomic History  ? Marital status: Married  ?  Spouse name: Not on file  ? Number of children: Not on file  ? Years of education: Not on file  ? Highest education level: Not on file  ?Occupational History  ? Not on file  ?Tobacco Use  ? Smoking status: Former  ?  Types: Cigarettes  ? Smokeless tobacco: Never  ? Tobacco comments:  ?  quit feb 2016  ?Vaping Use  ? Vaping Use: Never used  ?Substance and Sexual Activity  ? Alcohol use: No  ?  Alcohol/week: 0.0 standard drinks  ? Drug use: No  ? Sexual activity: Yes  ?  Birth control/protection: Surgical  ?Other Topics Concern  ? Not on file  ?Social History Narrative  ? 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  ? ?Social Determinants of Health  ? ?Financial Resource Strain: Not on file  ?Food Insecurity: Not on file  ?Transportation Needs: Not on file   ?Physical Activity: Not on file  ?Stress: Not on file  ?Social Connections: Not on file  ? ? ?Review of Systems ?Per HPI ? ?Objective:  ?BP 128/78   Pulse 71   Temp 98.2 ?F (36.8 ?C) (Oral)   Ht 5\' 2"  (1.575 m)   Wt 172 lb 9.6 oz (78.3 kg)   SpO2 98%   BMI 31.57 kg/m?  ? ?BP/Weight 12/31/2021 09/14/2021 05/11/2021  ?Systolic BP 128 132 112  ?Diastolic BP 78 76 64  ?Wt. (Lbs) 172.6 175.2 166.4  ?BMI 31.57 32.04 30.43  ?Some encounter information is confidential and restricted. Go to Review Flowsheets activity to see all data.  ? ? ?Physical Exam ?Vitals and nursing note reviewed.  ?Constitutional:   ?   General: She is not in acute distress. ?   Appearance: Normal appearance. She is not ill-appearing.  ?HENT:  ?   Head: Normocephalic and atraumatic.  ?Cardiovascular:  ?   Rate and Rhythm: Normal rate and regular rhythm.  ?Pulmonary:  ?   Effort: Pulmonary effort is normal.  ?   Breath sounds: Normal breath sounds.  ?Neurological:  ?   Mental Status: She is alert.  ?Psychiatric:     ?   Mood and Affect: Mood normal.     ?   Behavior: Behavior normal.  ? ? ?Lab Results  ?Component Value Date  ? WBC  6.7 01/27/2021  ? HGB 13.6 01/27/2021  ? HCT 40.0 01/27/2021  ? PLT 348 01/27/2021  ? GLUCOSE 87 01/27/2021  ? CHOL 215 (H) 01/27/2021  ? TRIG 97 01/27/2021  ? HDL 47 (L) 01/27/2021  ? LDLCALC 147 (H) 01/27/2021  ? ALT 10 01/27/2021  ? AST 13 01/27/2021  ? NA 138 01/27/2021  ? K 4.3 01/27/2021  ? CL 103 01/27/2021  ? CREATININE 0.68 01/27/2021  ? BUN 19 01/27/2021  ? CO2 26 01/27/2021  ? TSH 0.22 (L) 05/11/2021  ? HGBA1C 5.4 01/27/2021  ? ? ? ?Assessment & Plan:  ? ?Problem List Items Addressed This Visit   ? ?  ? Other  ? Fibromyalgia affecting multiple sites  ?  Patient requesting a note for her employer regarding accommodations for a standing desk.  I feel that this is reasonable.  Note was provided today.  Advised her to let me know if her employer has any issues or concerns regarding this. ?  ?  ? ? ? ?Everlene Other  DO ?[facility] ? ?

## 2022-01-10 NOTE — Telephone Encounter (Signed)
Pt called in again inquiring about a bill that has now gone to collections. Pt would like to speak to someone in billing about this. Please advise. ? ?Cb#: [phone] ?

## 2022-02-14 ENCOUNTER — Encounter: Payer: Self-pay | Admitting: Family Medicine

## 2022-02-28 ENCOUNTER — Encounter: Payer: Self-pay | Admitting: Adult Health

## 2022-02-28 ENCOUNTER — Ambulatory Visit (INDEPENDENT_AMBULATORY_CARE_PROVIDER_SITE_OTHER): Payer: No Typology Code available for payment source | Admitting: Adult Health

## 2022-02-28 DIAGNOSIS — F4010 Social phobia, unspecified: Secondary | ICD-10-CM

## 2022-02-28 DIAGNOSIS — F331 Major depressive disorder, recurrent, moderate: Secondary | ICD-10-CM | POA: Diagnosis not present

## 2022-02-28 DIAGNOSIS — F411 Generalized anxiety disorder: Secondary | ICD-10-CM | POA: Diagnosis not present

## 2022-02-28 MED ORDER — ARIPIPRAZOLE 5 MG PO TABS: 5.0000 mg | ORAL_TABLET | Freq: Every day | ORAL | 5 refills | Status: DC

## 2022-02-28 MED ORDER — FLUOXETINE HCL 20 MG PO CAPS: 20.0000 mg | ORAL_CAPSULE | Freq: Every day | ORAL | 5 refills | Status: DC

## 2022-02-28 MED ORDER — LAMOTRIGINE 100 MG PO TABS: 100.0000 mg | ORAL_TABLET | Freq: Every day | ORAL | 5 refills | Status: DC

## 2022-02-28 NOTE — Progress Notes (Signed)
Mary [name] ?[id number] ?04-03-1966 ?56 y.o. ? ?Subjective:  ? ?Patient ID:  Mary [name] is a 56 y.o. (DOB May 20, 1966) female. ? ?Chief Complaint: No chief complaint on file. ? ? ?HPI ?Mary [name] presents to the office today for follow-up of MDD, GAD and SAD. ? ?Describes mood today as "ok". Pleasant. Denies tearfulness. Mood symptoms - reports depression. Denies anxiety and irritability. Reports some social anxiety. Stating "I feel like I'm doing ok". Feels like current medication regimen is working well. Daughter doing well. Diagnosed with Fibromyalgia, DJD, and arthritis. Stable interest and motivation. Taking medications as prescribed.  ?Energy levels varies day to day. Active, does not have a regular exercise routine.  ?Enjoys some usual interests and activities. Married x 24 years. Lives with husband. Has 3 grown children. Mother local. Spending time with family. ?Appetite adequate. Weight loss - 168 pounds.  ?Sleeping better some nights than others. Averages 8 hours. ?Focus and concentration improved. Completing tasks. Managing aspects of household. Works full-time. ?Denies SI or HI.  ?Denies AH or VH. ?Taking vitamin supplements.  ? ?Previous medication trials: Wellbutrin XL, SSRI's, Cymbalta ? ? ?GAD-7   ? ?Flowsheet Row Office Visit from 12/18/2018 in [facility]  ?Total GAD-7 Score 9  ? ?  ? ?PHQ2-9   ? ?Flowsheet Row Office Visit from 04/07/2021 in [facility] Office Visit from 12/18/2018 in [facility]  ?PHQ-2 Total Score 0 3  ?PHQ-9 Total Score 0 14  ? ?  ? ?Flowsheet Row ED from 03/15/2021 in [facility]  ?C-SSRS RISK CATEGORY No Risk  ? ?  ?  ? ?Review of Systems:  ?Review of Systems  ?Musculoskeletal:  Negative for gait problem.  ?Neurological:  Negative for tremors.  ?Psychiatric/Behavioral:    ?     Please refer to HPI  ? ?Medications: I have reviewed the patient's current medications. ? ?Current Outpatient  Medications  ?Medication Sig Dispense Refill  ? ARIPiprazole (ABILIFY) 5 MG tablet Take 1 tablet (5 mg total) by mouth daily. 30 tablet 5  ? Ascorbic Acid (VITAMIN C) 1000 MG tablet Vitamin C    ? Cholecalciferol (VITAMIN D3) 125 MCG (5000 UT) CAPS Take by mouth.    ? FLUoxetine (PROZAC) 20 MG capsule Take 1 capsule (20 mg total) by mouth daily. 30 capsule 5  ? lamoTRIgine (LAMICTAL) 100 MG tablet Take 1 tablet (100 mg total) by mouth daily. 30 tablet 5  ? NON FORMULARY tumeric-ging-olive-oreg-capryl    ? NP THYROID 60 MG tablet Take 1 tablet (60 mg total) by mouth 2 (two) times daily. 60 tablet 3  ? progesterone (PROMETRIUM) 200 MG capsule Take 2 capsules (400 mg total) by mouth at bedtime. 60 capsule 3  ? Turmeric 500 MG CAPS Take by mouth.    ? valACYclovir (VALTREX) 500 MG tablet Take 500 mg by mouth 2 (two) times daily.    ? ?No current facility-administered medications for this visit.  ? ? ?Medication Side Effects: None ? ?Allergies:  ?Allergies  ?Allergen Reactions  ? Sulfa Antibiotics Other (See Comments)  ?  Lip swelling  ? ? ?Past Medical History:  ?Diagnosis Date  ? Fibromyalgia   ? Migraine   ? Thyroid disease   ? ? ?Past Medical History, Surgical history, Social history, and Family history were reviewed and updated as appropriate.  ? ?Please see review of systems for further details on the patient's review from today.  ? ?Objective:  ? ?Physical Exam:  ?  There were no vitals taken for this visit. ? ?Physical Exam ?Constitutional:   ?   General: She is not in acute distress. ?Musculoskeletal:     ?   General: No deformity.  ?Neurological:  ?   Mental Status: She is alert and oriented to person, place, and time.  ?   Coordination: Coordination normal.  ?Psychiatric:     ?   Attention and Perception: Attention and perception normal. She does not perceive auditory or visual hallucinations.     ?   Mood and Affect: Mood normal. Mood is not anxious or depressed. Affect is not labile, blunt, angry or  inappropriate.     ?   Speech: Speech normal.     ?   Behavior: Behavior normal.     ?   Thought Content: Thought content normal. Thought content is not paranoid or delusional. Thought content does not include homicidal or suicidal ideation. Thought content does not include homicidal or suicidal plan.     ?   Cognition and Memory: Cognition and memory normal.     ?   Judgment: Judgment normal.  ?   Comments: Insight intact  ? ? ?Lab Review:  ?   ?Component Value Date/Time  ? NA 138 01/27/2021 1750  ? NA 140 04/19/2018 0830  ? K 4.3 01/27/2021 1750  ? CL 103 01/27/2021 1750  ? CO2 26 01/27/2021 1750  ? GLUCOSE 87 01/27/2021 1750  ? BUN 19 01/27/2021 1750  ? BUN 17 04/19/2018 0830  ? CREATININE 0.68 01/27/2021 1750  ? CALCIUM 9.6 01/27/2021 1750  ? PROT 6.8 01/27/2021 1750  ? PROT 6.7 04/19/2018 0830  ? ALBUMIN 4.5 04/19/2018 0830  ? AST 13 01/27/2021 1750  ? ALT 10 01/27/2021 1750  ? ALKPHOS 79 04/19/2018 0830  ? BILITOT 0.4 01/27/2021 1750  ? BILITOT 0.3 04/19/2018 0830  ? GFRNONAA 100 01/27/2021 1750  ? GFRAA 116 01/27/2021 1750  ? ? ?   ?Component Value Date/Time  ? WBC 6.7 01/27/2021 1750  ? RBC 4.30 01/27/2021 1750  ? HGB 13.6 01/27/2021 1750  ? HGB 13.2 04/19/2018 0830  ? HCT 40.0 01/27/2021 1750  ? HCT 38.9 04/19/2018 0830  ? PLT 348 01/27/2021 1750  ? PLT 311 04/19/2018 0830  ? MCV 93.0 01/27/2021 1750  ? MCV 93 04/19/2018 0830  ? MCH 31.6 01/27/2021 1750  ? MCHC 34.0 01/27/2021 1750  ? RDW 12.5 01/27/2021 1750  ? RDW 13.0 04/19/2018 0830  ? LYMPHSABS 2.1 03/19/2019 1156  ? LYMPHSABS 2.1 04/19/2018 0830  ? MONOABS 0.5 03/19/2019 1156  ? EOSABS 0.1 03/19/2019 1156  ? EOSABS 0.1 04/19/2018 0830  ? BASOSABS 0.0 03/19/2019 1156  ? BASOSABS 0.0 04/19/2018 0830  ? ? ?No results found for: POCLITH, LITHIUM  ? ?No results found for: PHENYTOIN, PHENOBARB, VALPROATE, CBMZ  ? ?.res ?Assessment: Plan:   ? ?Plan: ? ?PDMP reviewed: ? ?Abilify 5mg  daily ?Lamictal 100mg  daily ?Prozac 20mg  daily ? ?  ?RTC 6 months ? ?Patient  advised to contact office with any questions, adverse effects, or acute worsening in signs and symptoms. ? ?Discussed potential metabolic side effects associated with atypical antipsychotics, as well as potential risk for movement side effects. Advised pt to contact office if movement side effects occur.   ? ?Counseled patient regarding potential benefits, risks, and side effects of Lamictal to include potential risk of Stevens-Johnson syndrome. Advised patient to stop taking Lamictal and contact office immediately if rash develops and to seek urgent medical  attention if rash is severe and/or spreading quickly.  ? ?Diagnoses and all orders for this visit: ? ?Social anxiety disorder ?-     ARIPiprazole (ABILIFY) 5 MG tablet; Take 1 tablet (5 mg total) by mouth daily. ?-     FLUoxetine (PROZAC) 20 MG capsule; Take 1 capsule (20 mg total) by mouth daily. ? ?Generalized anxiety disorder ? ?Major depressive disorder, recurrent episode, moderate (HCC) ?-     lamoTRIgine (LAMICTAL) 100 MG tablet; Take 1 tablet (100 mg total) by mouth daily. ? ?  ? ?Please see After Visit Summary for patient specific instructions. ? ?Future Appointments  ?Date Time Provider Department Center  ?03/04/2022  8:20 AM Campbell Riches, NP [facility]  ?03/10/2022  1:30 PM Shamleffer, Konrad Dolores, MD [facility] None  ?05/11/2022  7:40 AM [facility] DIAG [place]OM[place] 1 [facility]  ?05/11/2022  7:50 AM [facility] [place] 1 [facility]  ?07/04/2022  8:20 AM Tommie Sams, DO [facility]  ? ? ?No orders of the defined types were placed in this encounter. ? ? ?------------------------------- ?

## 2022-03-04 ENCOUNTER — Encounter: Payer: No Typology Code available for payment source | Admitting: Nurse Practitioner

## 2022-03-10 ENCOUNTER — Encounter: Payer: Self-pay | Admitting: Internal Medicine

## 2022-03-10 ENCOUNTER — Ambulatory Visit (INDEPENDENT_AMBULATORY_CARE_PROVIDER_SITE_OTHER): Payer: PRIVATE HEALTH INSURANCE | Admitting: Internal Medicine

## 2022-03-10 VITALS — BP 108/66 | HR 69 | Ht 62.0 in | Wt 165.0 lb

## 2022-03-10 DIAGNOSIS — E039 Hypothyroidism, unspecified: Secondary | ICD-10-CM | POA: Diagnosis not present

## 2022-03-10 LAB — TSH: TSH: 0.37 u[IU]/mL (ref 0.35–5.50)

## 2022-03-10 NOTE — Progress Notes (Signed)
Name: Mary [name]  MRN/ DOB: [id number], 1966-09-30    Age/ Sex: 56 y.o., female    PCP: Tommie Sams, DO   Reason for Endocrinology Evaluation: Thyroid disease     Date of Initial Endocrinology Evaluation: 03/10/2022     HPI: Ms. Mary [name] is a 56 y.o. female with a past medical history of thyroid disease, fibromyalgia, and migraine headaches. The patient presented for initial endocrinology clinic visit on 03/10/2022 for consultative assistance with her Hypothyroidism   She has been diagnosed with hypothyroid 2019 No prior neck sx  No prior radiation exposure    She was evaluated by Dr. Fransico Him in 2021  Used to see Dr. Karilyn Cota   Has been following with [facility]  She went from LT-4 replacement to armour thyroid but developed palpitations and now on LT-4 replacement and liothyronine    Takes it appropriately, dose unknown at this time No Biotin   She is on Mounjaro for weight loss  Denies constipation or diarrhea , magnesium helps  Denies palpitations  Denies tremors  She has been tired and sleepy    Strong FH of thyroid disease        HISTORY:  Past Medical History:  Past Medical History:  Diagnosis Date   Fibromyalgia    Migraine    Thyroid disease    Past Surgical History:  Past Surgical History:  Procedure Laterality Date   TONSILLECTOMY     TUBAL LIGATION     uterine ablation  2004   irregular heavy periods.    Social History:  reports that she has quit smoking. Her smoking use included cigarettes. She has never used smokeless tobacco. She reports that she does not drink alcohol and does not use drugs. Family History: family history includes Depression in her father, maternal uncle, and mother; Heart disease in her father and mother.   HOME MEDICATIONS: Allergies as of 03/10/2022       Reactions   Amoxicillin    Sulfa Antibiotics Other (See Comments)   Lip swelling        Medication List        Accurate as of Mar 10, 2022  8:55 AM. If you have any questions, ask your nurse or doctor.          ARIPiprazole 5 MG tablet Commonly known as: Abilify Take 1 tablet (5 mg total) by mouth daily.   FLUoxetine 20 MG capsule Commonly known as: PROZAC Take 1 capsule (20 mg total) by mouth daily.   lamoTRIgine 100 MG tablet Commonly known as: LaMICtal Take 1 tablet (100 mg total) by mouth daily.   NON FORMULARY tumeric-ging-olive-oreg-capryl   NP Thyroid 60 MG tablet Generic drug: thyroid Take 1 tablet (60 mg total) by mouth 2 (two) times daily.   progesterone 200 MG capsule Commonly known as: PROMETRIUM Take 2 capsules (400 mg total) by mouth at bedtime.   Turmeric 500 MG Caps Take by mouth.   valACYclovir 500 MG tablet Commonly known as: VALTREX Take 500 mg by mouth 2 (two) times daily.   vitamin C 1000 MG tablet Vitamin C   Vitamin D3 125 MCG (5000 UT) Caps Take by mouth.          REVIEW OF SYSTEMS: A comprehensive ROS was conducted with the patient and is negative except as per HPI   OBJECTIVE:  VS: BP 108/66 (BP Location: Left Arm, Patient Position: Sitting, Cuff Size: Small)   Pulse 69   Ht 5'  2" (1.575 m)   Wt 165 lb (74.8 kg)   SpO2 98%   BMI 30.18 kg/m     Wt Readings from Last 3 Encounters:  12/31/21 172 lb 9.6 oz (78.3 kg)  09/14/21 175 lb 3.2 oz (79.5 kg)  05/11/21 166 lb 6.4 oz (75.5 kg)     EXAM: General: Pt appears well and is in NAD  Neck: General: Supple without adenopathy. Thyroid: Thyroid size normal.  No goiter or nodules appreciated.  Lungs: Clear with good BS bilat with no rales, rhonchi, or wheezes  Heart: Auscultation: RRR.  Abdomen: Normoactive bowel sounds, soft, nontender, without masses or organomegaly palpable  Extremities:  BL LE: No pretibial edema normal ROM and strength.  Mental Status: Judgment, insight: Intact Orientation: Oriented to time, place, and person Mood and affect: No depression, anxiety, or agitation     DATA  REVIEWED:  Latest Reference Range & Units 03/10/22 14:03  TSH 0.35 - 5.50 uIU/mL 0.37      ASSESSMENT/PLAN/RECOMMENDATIONS:   Hypothyroidism:  -Patient is clinically euthyroid -No local neck symptoms -- Pt educated extensively on the correct way to take levothyroxine (first thing in the morning with water, 30 minutes before eating or taking other medications). - Pt encouraged to double dose the following day if she were to miss a dose given long half-life of levothyroxine. -She used to be on Armour Thyroid but that caused palpitations -She is currently on levothyroxine and liothyronine but she did not bring her medications nor does she recall her doses -Patient advised to send a portal message with the correct dosing so we can update her chart -TSH normal, will repeat labs in 2 months -No changes at this time    Signed electronically by: Lyndle Herrlich, MD  [facility]  [facility] [address] [place], [place] [address] Phone: [phone] FAX: [phone]   CC: Tommie Sams, DO [address] [place] [address] Phone: [phone] Fax: [phone]   Return to Endocrinology clinic as below: Future Appointments  Date Time Provider Department Center  03/10/2022  1:30 PM Lujean Ebright, Konrad Dolores, MD [facility] None  05/06/2022  8:20 AM Campbell Riches, NP [facility]  05/11/2022  7:40 AM [facility] DIAG TOMO 1 [facility]  05/11/2022  7:50 AM [facility] [place] 1 [facility]  07/04/2022  8:20 AM Tommie Sams, DO [facility]  08/31/2022  5:20 PM Mozingo, Thereasa Solo, NP CP-CP None

## 2022-03-10 NOTE — Patient Instructions (Signed)

## 2022-04-19 NOTE — Telephone Encounter (Signed)
Per documentation on patients gurantor account --GeneralPt called to set up PP. She said she called [facility] but they could not help her at the time and asked her to call [facility]. She set up PP to pay $100/month to be due on the 30th of each month until balance of $1190.54 is PIF.GuarantorBoles, JasminePatient:Mary [name], Mary [name] Bando text:Pt called to set up PP. She said she called [facility] but they could not help her at the time and asked her to call [facility]. She set up PP to pay $100/month to be due on the 30th of each month until balance of $1190.54 is PIF.

## 2022-05-06 ENCOUNTER — Encounter: Payer: PRIVATE HEALTH INSURANCE | Admitting: Nurse Practitioner

## 2022-05-09 ENCOUNTER — Other Ambulatory Visit: Payer: PRIVATE HEALTH INSURANCE

## 2022-05-09 ENCOUNTER — Telehealth: Payer: Self-pay

## 2022-05-09 DIAGNOSIS — M797 Fibromyalgia: Secondary | ICD-10-CM

## 2022-05-09 NOTE — Telephone Encounter (Signed)
Referral placed. Left detailed message on voicemail per DPR.

## 2022-05-09 NOTE — Telephone Encounter (Signed)
Caller name:Mary [name]   On DPR? :Yes   Call back number:[phone]  Provider they see: Adriana Simas   Reason for call:Pt is calling she wants a referral for  Dr Corliss Skains Rheumatologist for her fibromyalgia

## 2022-05-11 ENCOUNTER — Ambulatory Visit
Admission: RE | Admit: 2022-05-11 | Discharge: 2022-05-11 | Disposition: A | Discharge status: Home / Self Care | Payer: PRIVATE HEALTH INSURANCE | Source: Ambulatory Visit | Attending: Obstetrics and Gynecology | Admitting: Obstetrics and Gynecology

## 2022-05-11 ENCOUNTER — Other Ambulatory Visit: Payer: PRIVATE HEALTH INSURANCE

## 2022-05-11 DIAGNOSIS — N63 Unspecified lump in unspecified breast: Secondary | ICD-10-CM

## 2022-05-17 ENCOUNTER — Other Ambulatory Visit: Payer: PRIVATE HEALTH INSURANCE

## 2022-07-04 ENCOUNTER — Ambulatory Visit: Payer: PRIVATE HEALTH INSURANCE | Admitting: Family Medicine

## 2022-07-15 ENCOUNTER — Ambulatory Visit: Payer: PRIVATE HEALTH INSURANCE | Admitting: Internal Medicine

## 2022-07-21 ENCOUNTER — Ambulatory Visit: Payer: PRIVATE HEALTH INSURANCE | Admitting: Family Medicine

## 2022-07-29 ENCOUNTER — Ambulatory Visit (INDEPENDENT_AMBULATORY_CARE_PROVIDER_SITE_OTHER): Payer: PRIVATE HEALTH INSURANCE | Admitting: Family Medicine

## 2022-07-29 VITALS — BP 130/87 | Ht 62.0 in | Wt 165.0 lb

## 2022-07-29 DIAGNOSIS — S91119A Laceration without foreign body of unspecified toe without damage to nail, initial encounter: Secondary | ICD-10-CM

## 2022-07-29 MED ORDER — MUPIROCIN 2 % EX OINT: 1.0000 | TOPICAL_OINTMENT | Freq: Two times a day (BID) | CUTANEOUS | 0 refills | Status: AC

## 2022-07-29 NOTE — Patient Instructions (Signed)
Keep the area clean.  Medication as directed.  Take care  Dr. Aarin Sparkman 

## 2022-07-31 DIAGNOSIS — S91119A Laceration without foreign body of unspecified toe without damage to nail, initial encounter: Secondary | ICD-10-CM | POA: Insufficient documentation

## 2022-07-31 NOTE — Progress Notes (Signed)
Subjective:  Patient ID: Mary [name], female    DOB: 1966-09-24  Age: 56 y.o. MRN: [id number]  CC: Chief Complaint  Patient presents with   cut right pinky toe on plastic trashcan on Tuesday night    HPI:  56 year old female presents for evaluation of the above.   Cut right 5th toe on a plastic trashcan on Tuesday. Did not seek care. Currently bandaged. No bleeding at this time. Concerned about need for tetanus. Moderate pain. No other complaints.    Patient Active Problem List   Diagnosis Date Noted   Cut of toe 07/31/2022   Genital herpes 05/13/2020   Social anxiety disorder 02/26/2020   Bipolar 2 disorder ([place]) 01/09/2020   Anxiety 01/09/2020   Mixed hyperlipidemia 02/14/2019   Hypothyroidism 09/26/2018   Goiter 09/26/2018   Fibromyalgia affecting multiple sites 05/28/2014    Social Hx   Social History   Socioeconomic History   Marital status: Married    Spouse name: Not on file   Number of children: Not on file   Years of education: Not on file   Highest education level: Not on file  Occupational History   Not on file  Tobacco Use   Smoking status: Former    Types: Cigarettes   Smokeless tobacco: Never   Tobacco comments:    quit feb 2016  Vaping Use   Vaping Use: Never used  Substance and Sexual Activity   Alcohol use: No    Alcohol/week: 0.0 standard drinks of alcohol   Drug use: No   Sexual activity: Yes    Birth control/protection: Surgical  Other Topics Concern   Not on file  Social History Narrative   Married since 1998.Lives with husband.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   Social Determinants of Health   Financial Resource Strain: Not on file  Food Insecurity: Not on file  Transportation Needs: Not on file  Physical Activity: Not on file  Stress: Not on file  Social Connections: Not on file    Review of Systems Per HPI  Objective:  BP 130/87   Ht 5\' 2"  (1.575 m)   Wt 165 lb (74.8 kg)    BMI 30.18 kg/m      07/29/2022    3:46 PM 03/10/2022    1:14 PM 12/31/2021    8:28 AM  BP/Weight  Systolic BP 485 462 703  Diastolic BP 87 66 78  Wt. (Lbs) 165 165 172.6  BMI 30.18 kg/m2 30.18 kg/m2 31.57 kg/m2    Physical Exam Vitals and nursing note reviewed.  Constitutional:      General: She is not in acute distress.    Appearance: Normal appearance.  Skin:    Comments: Right 5th toe - tip of the toe with healing laceration. Eschar noted.. No drainage. No significant erythema.   Neurological:     Mental Status: She is alert.    Lab Results  Component Value Date   WBC 6.7 01/27/2021   HGB 13.6 01/27/2021   HCT 40.0 01/27/2021   PLT 348 01/27/2021   GLUCOSE 87 01/27/2021   CHOL 215 (H) 01/27/2021   TRIG 97 01/27/2021   HDL 47 (L) 01/27/2021   LDLCALC 147 (H) 01/27/2021   ALT 10 01/27/2021   AST 13 01/27/2021   NA 138 01/27/2021   K 4.3 01/27/2021   CL 103 01/27/2021   CREATININE 0.68 01/27/2021   BUN 19 01/27/2021   CO2 26 01/27/2021   TSH 0.37 03/10/2022  HGBA1C 5.4 01/27/2021     Assessment & Plan:   Problem List Items Addressed This Visit       Musculoskeletal and Integument   Cut of toe - Primary    Healing appropriately. Bactroban to prevent secondary infection.  Supportive care.        Meds ordered this encounter  Medications   mupirocin ointment (BACTROBAN) 2 %    Sig: Apply 1 Application topically 2 (two) times daily for 7 days.    Dispense:  30 g    Refill:  0    Labarron Durnin DO [facility]

## 2022-07-31 NOTE — Assessment & Plan Note (Signed)
Healing appropriately. Bactroban to prevent secondary infection.  Supportive care.

## 2022-08-15 ENCOUNTER — Ambulatory Visit: Payer: PRIVATE HEALTH INSURANCE | Admitting: Internal Medicine

## 2022-08-31 ENCOUNTER — Encounter: Payer: Self-pay | Admitting: Adult Health

## 2022-08-31 ENCOUNTER — Ambulatory Visit (INDEPENDENT_AMBULATORY_CARE_PROVIDER_SITE_OTHER): Payer: No Typology Code available for payment source | Admitting: Adult Health

## 2022-08-31 DIAGNOSIS — F4010 Social phobia, unspecified: Secondary | ICD-10-CM

## 2022-08-31 DIAGNOSIS — F411 Generalized anxiety disorder: Secondary | ICD-10-CM

## 2022-08-31 DIAGNOSIS — F331 Major depressive disorder, recurrent, moderate: Secondary | ICD-10-CM

## 2022-08-31 MED ORDER — FLUOXETINE HCL 20 MG PO CAPS: 20.0000 mg | ORAL_CAPSULE | Freq: Every day | ORAL | 5 refills | Status: DC

## 2022-08-31 MED ORDER — LAMOTRIGINE 100 MG PO TABS: 100.0000 mg | ORAL_TABLET | Freq: Every day | ORAL | 5 refills | Status: DC

## 2022-08-31 NOTE — Progress Notes (Signed)
Mary [name] [id number] 08-17-1966 56 y.o.  Subjective:   Patient ID:  Mary [name] is a 56 y.o. (DOB 1966-08-10) female.  Chief Complaint: No chief complaint on file.   HPI AYLEAH [name] presents to the office today for follow-up of MDD, GAD and SAD.  Describes mood today as "ok". Pleasant. Denies tearfulness. Mood symptoms - reports depression. Denies anxiety and irritability. Reports some social anxiety. Stating "I feel like I'm doing ok". Feels like current medication regimen is working well. Daughter doing well. Diagnosed with Fibromyalgia, DJD, and arthritis. Stable interest and motivation. Taking medications as prescribed.  Energy levels varies day to day. Active, does not have a regular exercise routine.  Enjoys some usual interests and activities. Married x 24 years. Lives with husband. Has 3 grown children. Mother local. Spending time with family. Appetite adequate. Weight loss - 168 pounds.  Sleeping better some nights than others. Averages 8 hours. Focus and concentration improved. Completing tasks. Managing aspects of household. Works full-time. Denies SI or HI.  Denies AH or VH. Taking vitamin supplements.   Previous medication trials: Wellbutrin XL, SSRI's, Cymbalta   GAD-7    Flowsheet Row Office Visit from 12/18/2018 in [facility]  Total GAD-7 Score 9      PHQ2-9    Flowsheet Row Office Visit from 04/07/2021 in [facility] Office Visit from 12/18/2018 in [facility]  PHQ-2 Total Score 0 3  PHQ-9 Total Score 0 14      Flowsheet Row ED from 03/15/2021 in [facility] RISK CATEGORY No Risk        Review of Systems:  Review of Systems  Musculoskeletal:  Negative for gait problem.  Neurological:  Negative for tremors.  Psychiatric/Behavioral:         Please refer to HPI    Medications: I have reviewed the patient's current medications.  Current Outpatient  Medications  Medication Sig Dispense Refill   lamoTRIgine (LAMICTAL) 100 MG tablet Take 1 tablet (100 mg total) by mouth daily. 30 tablet 5   Ascorbic Acid (VITAMIN C) 1000 MG tablet Vitamin C     Cholecalciferol (VITAMIN D3) 125 MCG (5000 UT) CAPS Take by mouth.     FLUoxetine (PROZAC) 20 MG capsule Take 1 capsule (20 mg total) by mouth daily. 30 capsule 5   MOUNJARO 2.5 MG/0.5ML Pen SMARTSIG:2.5 Milligram(s) SUB-Q Once a Week     Naltrexone HCl, Pain, 4.5 MG CAPS 1 SL qhs     NON FORMULARY tumeric-ging-olive-oreg-capryl     NON FORMULARY Take 1 tablet by mouth every morning.     progesterone (PROMETRIUM) 200 MG capsule Take 1 capsule by mouth at bedtime.     Turmeric 500 MG CAPS Take by mouth.     valACYclovir (VALTREX) 500 MG tablet Take 500 mg by mouth 2 (two) times daily.     No current facility-administered medications for this visit.    Medication Side Effects: None  Allergies:  Allergies  Allergen Reactions   Amoxicillin    Sulfa Antibiotics Other (See Comments)    Lip swelling    Past Medical History:  Diagnosis Date   Fibromyalgia    Migraine    Thyroid disease     Past Medical History, Surgical history, Social history, and Family history were reviewed and updated as appropriate.   Please see review of systems for further details on the patient's review from today.   Objective:   Physical Exam:  There were no vitals taken for this visit.  Physical Exam Constitutional:      General: She is not in acute distress. Musculoskeletal:        General: No deformity.  Neurological:     Mental Status: She is alert and oriented to person, place, and time.     Coordination: Coordination normal.  Psychiatric:        Attention and Perception: Attention and perception normal. She does not perceive auditory or visual hallucinations.        Mood and Affect: Mood normal. Mood is not anxious or depressed. Affect is not labile, blunt, angry or inappropriate.        Speech:  Speech normal.        Behavior: Behavior normal.        Thought Content: Thought content normal. Thought content is not paranoid or delusional. Thought content does not include homicidal or suicidal ideation. Thought content does not include homicidal or suicidal plan.        Cognition and Memory: Cognition and memory normal.        Judgment: Judgment normal.     Comments: Insight intact     Lab Review:     Component Value Date/Time   NA 138 01/27/2021 1750   NA 140 04/19/2018 0830   K 4.3 01/27/2021 1750   CL 103 01/27/2021 1750   CO2 26 01/27/2021 1750   GLUCOSE 87 01/27/2021 1750   BUN 19 01/27/2021 1750   BUN 17 04/19/2018 0830   CREATININE 0.68 01/27/2021 1750   CALCIUM 9.6 01/27/2021 1750   PROT 6.8 01/27/2021 1750   PROT 6.7 04/19/2018 0830   ALBUMIN 4.5 04/19/2018 0830   AST 13 01/27/2021 1750   ALT 10 01/27/2021 1750   ALKPHOS 79 04/19/2018 0830   BILITOT 0.4 01/27/2021 1750   BILITOT 0.3 04/19/2018 0830   GFRNONAA 100 01/27/2021 1750   GFRAA 116 01/27/2021 1750       Component Value Date/Time   WBC 6.7 01/27/2021 1750   RBC 4.30 01/27/2021 1750   HGB 13.6 01/27/2021 1750   HGB 13.2 04/19/2018 0830   HCT 40.0 01/27/2021 1750   HCT 38.9 04/19/2018 0830   PLT 348 01/27/2021 1750   PLT 311 04/19/2018 0830   MCV 93.0 01/27/2021 1750   MCV 93 04/19/2018 0830   MCH 31.6 01/27/2021 1750   MCHC 34.0 01/27/2021 1750   RDW 12.5 01/27/2021 1750   RDW 13.0 04/19/2018 0830   LYMPHSABS 2.1 03/19/2019 1156   LYMPHSABS 2.1 04/19/2018 0830   MONOABS 0.5 03/19/2019 1156   EOSABS 0.1 03/19/2019 1156   EOSABS 0.1 04/19/2018 0830   BASOSABS 0.0 03/19/2019 1156   BASOSABS 0.0 04/19/2018 0830    No results found for: "POCLITH", "LITHIUM"   No results found for: "PHENYTOIN", "PHENOBARB", "VALPROATE", "CBMZ"   .res Assessment: Plan:     Plan:  PDMP reviewed:  D/C Abilify 5mg  daily  Lamictal 100mg  daily Prozac 20mg  daily   RTC 6 months  Patient advised to  contact office with any questions, adverse effects, or acute worsening in signs and symptoms.  Discussed potential metabolic side effects associated with atypical antipsychotics, as well as potential risk for movement side effects. Advised pt to contact office if movement side effects occur.    Counseled patient regarding potential benefits, risks, and side effects of Lamictal to include potential risk of Stevens-Johnson syndrome. Advised patient to stop taking Lamictal and contact office immediately if rash develops and to  seek urgent medical attention if rash is severe and/or spreading quickly.   Diagnoses and all orders for this visit:  Major depressive disorder, recurrent episode, moderate (HCC) -     lamoTRIgine (LAMICTAL) 100 MG tablet; Take 1 tablet (100 mg total) by mouth daily.  Generalized anxiety disorder  Social anxiety disorder -     FLUoxetine (PROZAC) 20 MG capsule; Take 1 capsule (20 mg total) by mouth daily.     Please see After Visit Summary for patient specific instructions.  No future appointments.   No orders of the defined types were placed in this encounter.   -------------------------------

## 2022-09-26 ENCOUNTER — Other Ambulatory Visit: Payer: Self-pay

## 2022-09-26 ENCOUNTER — Telehealth: Payer: Self-pay | Admitting: Adult Health

## 2022-09-26 MED ORDER — ARIPIPRAZOLE 5 MG PO TABS: ORAL_TABLET | ORAL | 0 refills | Status: DC

## 2022-09-26 NOTE — Telephone Encounter (Signed)
Pt called and said that she is experiencing  depression and feels like she needs to have a med added. Please call her at [phone]

## 2022-09-26 NOTE — Telephone Encounter (Signed)
Pt stated she thinks she needs to restart Abilify due to having more depression.She stopped it because she was not taking it often.She is using [id number] [place] [place] - [phone] if we send rx

## 2022-09-26 NOTE — Telephone Encounter (Signed)
LVM to rtc 

## 2022-09-26 NOTE — Telephone Encounter (Signed)
Rx sent 

## 2022-10-12 ENCOUNTER — Telehealth: Payer: Self-pay | Admitting: Adult Health

## 2022-10-12 NOTE — Telephone Encounter (Signed)
Pt called needing advise about taking Magnesium 325 mg with her other meds. Contact # [phone]

## 2022-10-13 NOTE — Telephone Encounter (Signed)
Pt stated she has fibromyalgia and that magnesium should help

## 2022-10-13 NOTE — Telephone Encounter (Signed)
Is magnesium ok to take with meds?

## 2022-10-13 NOTE — Telephone Encounter (Signed)
That should be fine - what is she taking the Magnesium for?

## 2022-11-03 ENCOUNTER — Ambulatory Visit (INDEPENDENT_AMBULATORY_CARE_PROVIDER_SITE_OTHER): Payer: PRIVATE HEALTH INSURANCE

## 2022-11-03 ENCOUNTER — Ambulatory Visit: Payer: PRIVATE HEALTH INSURANCE | Admitting: Family Medicine

## 2022-11-03 ENCOUNTER — Ambulatory Visit (INDEPENDENT_AMBULATORY_CARE_PROVIDER_SITE_OTHER): Payer: PRIVATE HEALTH INSURANCE | Admitting: Orthopedic Surgery

## 2022-11-03 ENCOUNTER — Encounter: Payer: Self-pay | Admitting: Orthopedic Surgery

## 2022-11-03 VITALS — BP 122/86 | HR 78 | Ht 63.0 in | Wt 156.0 lb

## 2022-11-03 DIAGNOSIS — M25512 Pain in left shoulder: Secondary | ICD-10-CM

## 2022-11-03 DIAGNOSIS — M25511 Pain in right shoulder: Secondary | ICD-10-CM

## 2022-11-03 DIAGNOSIS — G8929 Other chronic pain: Secondary | ICD-10-CM

## 2022-11-03 DIAGNOSIS — M797 Fibromyalgia: Secondary | ICD-10-CM

## 2022-11-03 MED ORDER — MELOXICAM 7.5 MG PO TABS: 7.5000 mg | ORAL_TABLET | Freq: Every day | ORAL | 5 refills | Status: DC

## 2022-11-03 NOTE — Patient Instructions (Addendum)
Physical therapy has been ordered for you at [facility]. They should call you to schedule, [phone] is the phone number to call, if you want to call to schedule.   If the physical therapy and the anti-inflammatories do not help then I would seek out acupuncture
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
## 2022-11-18 ENCOUNTER — Ambulatory Visit (HOSPITAL_COMMUNITY): Payer: PRIVATE HEALTH INSURANCE | Attending: Occupational Therapy | Admitting: Occupational Therapy

## 2022-11-28 ENCOUNTER — Ambulatory Visit (HOSPITAL_COMMUNITY): Payer: PRIVATE HEALTH INSURANCE | Admitting: Occupational Therapy

## 2022-12-08 ENCOUNTER — Encounter (HOSPITAL_COMMUNITY): Payer: PRIVATE HEALTH INSURANCE | Admitting: Occupational Therapy

## 2022-12-13 ENCOUNTER — Encounter (HOSPITAL_COMMUNITY): Payer: PRIVATE HEALTH INSURANCE | Admitting: Occupational Therapy

## 2022-12-22 ENCOUNTER — Encounter: Payer: Self-pay | Admitting: Radiology

## 2023-01-10 ENCOUNTER — Encounter: Payer: Self-pay | Admitting: Family Medicine

## 2023-01-10 ENCOUNTER — Ambulatory Visit (INDEPENDENT_AMBULATORY_CARE_PROVIDER_SITE_OTHER): Payer: PRIVATE HEALTH INSURANCE | Admitting: Family Medicine

## 2023-01-10 VITALS — BP 115/75 | HR 77 | Temp 98.2°F | Ht 63.0 in | Wt 155.0 lb

## 2023-01-10 DIAGNOSIS — G8929 Other chronic pain: Secondary | ICD-10-CM

## 2023-01-10 DIAGNOSIS — E039 Hypothyroidism, unspecified: Secondary | ICD-10-CM | POA: Diagnosis not present

## 2023-01-10 DIAGNOSIS — M797 Fibromyalgia: Secondary | ICD-10-CM | POA: Diagnosis not present

## 2023-01-10 DIAGNOSIS — E782 Mixed hyperlipidemia: Secondary | ICD-10-CM | POA: Diagnosis not present

## 2023-01-10 MED ORDER — MELOXICAM 15 MG PO TABS: 15.0000 mg | ORAL_TABLET | Freq: Every day | ORAL | 3 refills | Status: DC

## 2023-01-10 MED ORDER — NORTRIPTYLINE HCL 10 MG PO CAPS: 10.0000 mg | ORAL_CAPSULE | Freq: Every day | ORAL | 3 refills | Status: DC

## 2023-01-10 NOTE — Patient Instructions (Signed)
Mobic increased.  Try the nortriptyline.   Labs today.

## 2023-01-10 NOTE — Progress Notes (Signed)
Subjective:  Patient ID: Mary [name], female    DOB: [birth year]  Age: 57 y.o. MRN: [id number]  CC: Chief Complaint  Patient presents with   pressure headache    Couple of weeks now frontal , nasal tightness, changes in smells, comes and goes smells like burning motor oil   Shoulder Pain    Follow up from ortho needs long term medication    HPI:  57 year old female with the below mentioned medical problems presents for evaluation of the above.  Patient reports longstanding pain.  Patient states that she has been previously diagnosed with fibromyalgia.  She has recently seen orthopedics and was advised to follow-up with me.  Reports longstanding and chronic pain of the upper back, neck, and shoulders bilaterally.  Patient states that she has tried anti-inflammatories, Cymbalta, Lyrica, gabapentin.  She was recently recommended to do physical therapy but states that her insurance did not cover it.  She states that she tried to see rheumatology but they refused to see her.  Diagnosis of fibromyalgia was made by rheumatology.  Patient states that she has had prior workup including x-rays and MRI which have been unrevealing.  Patient is very troubled by the extent of her pain.  She would like to discuss this today.  Patient also reports recent frontal and occipital headaches.  Patient Active Problem List   Diagnosis Date Noted   Genital herpes 05/13/2020   Social anxiety disorder 02/26/2020   Bipolar 2 disorder ([place]) 01/09/2020   Anxiety 01/09/2020   Mixed hyperlipidemia 02/14/2019   Hypothyroidism 09/26/2018   Fibromyalgia affecting multiple sites 05/28/2014    Social Hx   Social History   Socioeconomic History   Marital status: Married    Spouse name: Not on file   Number of children: Not on file   Years of education: Not on file   Highest education level: Not on file  Occupational History   Not on file  Tobacco Use   Smoking status: Former    Types: Cigarettes    Smokeless tobacco: Never   Tobacco comments:    quit feb 2016  Vaping Use   Vaping Use: Never used  Substance and Sexual Activity   Alcohol use: No    Alcohol/week: 0.0 standard drinks of alcohol   Drug use: No   Sexual activity: Yes    Birth control/protection: Surgical  Other Topics Concern   Not on file  Social History Narrative   Married since 1998.Lives with husband.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   Social Determinants of Health   Financial Resource Strain: Not on file  Food Insecurity: Not on file  Transportation Needs: Not on file  Physical Activity: Not on file  Stress: Not on file  Social Connections: Not on file    Review of Systems Per HPI  Objective:  BP 115/75   Pulse 77   Temp 98.2 F (36.8 C)   Ht 5\' 3"  (1.6 m)   Wt 155 lb (70.3 kg)   SpO2 97%   BMI 27.46 kg/m      01/10/2023    3:15 PM 11/03/2022   11:04 AM 07/29/2022    3:46 PM  BP/Weight  Systolic BP [id number]  Diastolic BP 75 86 87  Wt. (Lbs) 155 156 165  BMI 27.46 kg/m2 27.63 kg/m2 30.18 kg/m2    Physical Exam Constitutional:      General: She is not in acute distress.    Appearance: Normal appearance.  HENT:  Head: Normocephalic and atraumatic.  Eyes:     General:        Right eye: No discharge.        Left eye: No discharge.     Conjunctiva/sclera: Conjunctivae normal.  Cardiovascular:     Rate and Rhythm: Normal rate and regular rhythm.  Pulmonary:     Effort: Pulmonary effort is normal.     Breath sounds: Normal breath sounds. No wheezing, rhonchi or rales.  Musculoskeletal:     Comments: Tenderness of the trapezius muscles.  Normal rotator cuff strength.  No evidence of impingement.  Neurological:     Mental Status: She is alert.     Lab Results  Component Value Date   WBC 6.7 01/27/2021   HGB 13.6 01/27/2021   HCT 40.0 01/27/2021   PLT 348 01/27/2021   GLUCOSE 87 01/27/2021   CHOL 215 (H) 01/27/2021   TRIG 97 01/27/2021    HDL 47 (L) 01/27/2021   LDLCALC 147 (H) 01/27/2021   ALT 10 01/27/2021   AST 13 01/27/2021   NA 138 01/27/2021   K 4.3 01/27/2021   CL 103 01/27/2021   CREATININE 0.68 01/27/2021   BUN 19 01/27/2021   CO2 26 01/27/2021   TSH 0.37 03/10/2022   HGBA1C 5.4 01/27/2021     Assessment & Plan:   Problem List Items Addressed This Visit       Endocrine   Hypothyroidism   Relevant Orders   TSH + free T4   T3, Free     Other   Mixed hyperlipidemia   Relevant Orders   Lipid panel   Fibromyalgia affecting multiple sites - Primary    Uncontrolled, worsening.  Patient with a prior history of fibromyalgia.  She has had an unrevealing workup regarding the source of her pain.  We discussed the nature of fibromyalgia and therapeutic options today.  Increasing meloxicam.  Trial of nortriptyline.      Relevant Medications   nortriptyline (PAMELOR) 10 MG capsule   meloxicam (MOBIC) 15 MG tablet   Other Visit Diagnoses     Other chronic pain       Relevant Medications   nortriptyline (PAMELOR) 10 MG capsule   meloxicam (MOBIC) 15 MG tablet   Other Relevant Orders   CBC   CMP14+EGFR       Meds ordered this encounter  Medications   nortriptyline (PAMELOR) 10 MG capsule    Sig: Take 1 capsule (10 mg total) by mouth at bedtime.    Dispense:  30 capsule    Refill:  3   meloxicam (MOBIC) 15 MG tablet    Sig: Take 1 tablet (15 mg total) by mouth daily.    Dispense:  30 tablet    Refill:  3    Follow-up:  Return in about 3 months (around 04/12/2023).  Ricardo

## 2023-01-10 NOTE — Assessment & Plan Note (Addendum)
Uncontrolled, worsening.  Patient with a prior history of fibromyalgia.  She has had an unrevealing workup regarding the source of her pain.  We discussed the nature of fibromyalgia and therapeutic options today.  Increasing meloxicam.  Trial of nortriptyline.

## 2023-01-11 LAB — CBC
Hematocrit: 41.9 % (ref 34.0–46.6)
Hemoglobin: 14.4 g/dL (ref 11.1–15.9)
MCH: 32.4 pg (ref 26.6–33.0)
MCHC: 34.4 g/dL (ref 31.5–35.7)
MCV: 94 fL (ref 79–97)
Platelets: 350 10*3/uL (ref 150–450)
RBC: 4.44 x10E6/uL (ref 3.77–5.28)
RDW: 11.6 % — ABNORMAL LOW (ref 11.7–15.4)
WBC: 7.3 10*3/uL (ref 3.4–10.8)

## 2023-01-11 LAB — CMP14+EGFR
ALT: 17 IU/L (ref 0–32)
AST: 13 IU/L (ref 0–40)
Albumin/Globulin Ratio: 1.9 (ref 1.2–2.2)
Albumin: 4.6 g/dL (ref 3.8–4.9)
Alkaline Phosphatase: 92 IU/L (ref 44–121)
BUN/Creatinine Ratio: 23 (ref 9–23)
BUN: 17 mg/dL (ref 6–24)
Bilirubin Total: <0.2 mg/dL (ref 0.0–1.2)
CO2: 22 mmol/L (ref 20–29)
Calcium: 9.4 mg/dL (ref 8.7–10.2)
Chloride: 104 mmol/L (ref 96–106)
Creatinine, Ser: 0.74 mg/dL (ref 0.57–1.00)
Globulin, Total: 2.4 g/dL (ref 1.5–4.5)
Glucose: 85 mg/dL (ref 70–99)
Potassium: 4.4 mmol/L (ref 3.5–5.2)
Sodium: 141 mmol/L (ref 134–144)
Total Protein: 7 g/dL (ref 6.0–8.5)
eGFR: 95 mL/min/{1.73_m2} (ref >59)

## 2023-01-11 LAB — LIPID PANEL
Chol/HDL Ratio: 4.4 ratio (ref 0.0–4.4)
Cholesterol, Total: 208 mg/dL — ABNORMAL HIGH (ref 100–199)
HDL: 47 mg/dL (ref >39)
LDL Chol Calc (NIH): 133 mg/dL — ABNORMAL HIGH (ref 0–99)
Triglycerides: 157 mg/dL — ABNORMAL HIGH (ref 0–149)
VLDL Cholesterol Cal: 28 mg/dL (ref 5–40)

## 2023-01-11 LAB — TSH+FREE T4
Free T4: 1.52 ng/dL (ref 0.82–1.77)
TSH: 0.071 u[IU]/mL — ABNORMAL LOW (ref 0.450–4.500)

## 2023-01-11 LAB — T3, FREE: T3, Free: 3.6 pg/mL (ref 2.0–4.4)

## 2023-01-12 ENCOUNTER — Telehealth: Payer: Self-pay | Admitting: Adult Health

## 2023-01-12 NOTE — Telephone Encounter (Signed)
Pt called at 9:50a.  She said her doctor put her on Nortripyline for her Fibromyalgia.  She googled it and was concerned about whether it would be a problem to take it with the meds she gets from [place].  No upcoming appt scheduled.

## 2023-01-12 NOTE — Telephone Encounter (Signed)
It should be fine with current medications. In addition if there was any potential interactions the pharmacy would have notified her.

## 2023-01-12 NOTE — Telephone Encounter (Signed)
Please see message and advise 

## 2023-01-12 NOTE — Telephone Encounter (Signed)
Patient notified

## 2023-01-16 ENCOUNTER — Ambulatory Visit (HOSPITAL_COMMUNITY)
Admission: RE | Admit: 2023-01-16 | Discharge: 2023-01-16 | Disposition: A | Discharge status: Home / Self Care | Payer: PRIVATE HEALTH INSURANCE | Source: Ambulatory Visit | Attending: Family Medicine | Admitting: Family Medicine

## 2023-01-16 ENCOUNTER — Ambulatory Visit (INDEPENDENT_AMBULATORY_CARE_PROVIDER_SITE_OTHER): Payer: PRIVATE HEALTH INSURANCE | Admitting: Family Medicine

## 2023-01-16 ENCOUNTER — Telehealth: Payer: Self-pay | Admitting: Family Medicine

## 2023-01-16 ENCOUNTER — Other Ambulatory Visit (HOSPITAL_COMMUNITY)
Admission: RE | Admit: 2023-01-16 | Discharge: 2023-01-16 | Disposition: A | Discharge status: Home / Self Care | Payer: PRIVATE HEALTH INSURANCE | Source: Ambulatory Visit | Attending: Family Medicine | Admitting: Family Medicine

## 2023-01-16 VITALS — BP 118/64 | HR 55 | Temp 98.8°F | Ht 63.0 in | Wt 154.0 lb

## 2023-01-16 DIAGNOSIS — R0602 Shortness of breath: Secondary | ICD-10-CM

## 2023-01-16 DIAGNOSIS — J988 Other specified respiratory disorders: Secondary | ICD-10-CM | POA: Insufficient documentation

## 2023-01-16 LAB — CBC WITH DIFFERENTIAL/PLATELET
Abs Immature Granulocytes: 0.01 10*3/uL (ref 0.00–0.07)
Basophils Absolute: 0 10*3/uL (ref 0.0–0.1)
Basophils Relative: 0 %
Eosinophils Absolute: 0.1 10*3/uL (ref 0.0–0.5)
Eosinophils Relative: 1 %
HCT: 42 % (ref 36.0–46.0)
Hemoglobin: 14.2 g/dL (ref 12.0–15.0)
Immature Granulocytes: 0 %
Lymphocytes Relative: 20 %
Lymphs Abs: 1.3 10*3/uL (ref 0.7–4.0)
MCH: 31.8 pg (ref 26.0–34.0)
MCHC: 33.8 g/dL (ref 30.0–36.0)
MCV: 94.2 fL (ref 80.0–100.0)
Monocytes Absolute: 0.6 10*3/uL (ref 0.1–1.0)
Monocytes Relative: 9 %
Neutro Abs: 4.6 10*3/uL (ref 1.7–7.7)
Neutrophils Relative %: 70 %
Platelets: 282 10*3/uL (ref 150–400)
RBC: 4.46 MIL/uL (ref 3.87–5.11)
RDW: 11.4 % — ABNORMAL LOW (ref 11.5–15.5)
WBC: 6.6 10*3/uL (ref 4.0–10.5)
nRBC: 0 % (ref 0.0–0.2)

## 2023-01-16 LAB — COMPREHENSIVE METABOLIC PANEL
ALT: 45 U/L — ABNORMAL HIGH (ref 0–44)
AST: 18 U/L (ref 15–41)
Albumin: 4 g/dL (ref 3.5–5.0)
Alkaline Phosphatase: 92 U/L (ref 38–126)
Anion gap: 11 (ref 5–15)
BUN: 9 mg/dL (ref 6–20)
CO2: 23 mmol/L (ref 22–32)
Calcium: 8.6 mg/dL — ABNORMAL LOW (ref 8.9–10.3)
Chloride: 100 mmol/L (ref 98–111)
Creatinine, Ser: 0.82 mg/dL (ref 0.44–1.00)
GFR, Estimated: >60 mL/min (ref >60)
Glucose, Bld: 112 mg/dL — ABNORMAL HIGH (ref 70–99)
Potassium: 4 mmol/L (ref 3.5–5.1)
Sodium: 134 mmol/L — ABNORMAL LOW (ref 135–145)
Total Bilirubin: 0.6 mg/dL (ref 0.3–1.2)
Total Protein: 7.2 g/dL (ref 6.5–8.1)

## 2023-01-16 LAB — TROPONIN I (HIGH SENSITIVITY): Troponin I (High Sensitivity): 2 ng/L (ref <18)

## 2023-01-16 MED ORDER — LEVOTHYROXINE SODIUM 75 MCG PO TABS: 75.0000 ug | ORAL_TABLET | Freq: Every day | ORAL | 0 refills | Status: DC

## 2023-01-16 MED ORDER — DOXYCYCLINE HYCLATE 100 MG PO TABS: 100.0000 mg | ORAL_TABLET | Freq: Two times a day (BID) | ORAL | 0 refills | Status: DC

## 2023-01-16 NOTE — Progress Notes (Signed)
Subjective:  Patient ID: Mary [name], female    DOB: Dec 24, 1965  Age: 57 y.o. MRN: [id number]  CC: Chief Complaint  Patient presents with   Nasal Congestion   Cough    Productive chest congestion , no fever since last Wednesday started with throat soreness Was taking mucinex and tylenol. [facility] gave amoxcillin tested negative for all three    HPI:  57 year old female presents for evaluation of the above.  Patient reports that she has been sick since last Wednesday.  Was seen over the weekend at a local urgent care and was tested for COVID, flu, and strep which were all negative.  She continues to be symptomatic and feels very poorly.  She reports cough, congestion, discolored rhinorrhea, sore throat, ear discomfort.  She now is experiencing palpitations and shortness of breath.  She feels very poorly.  No documented fever.  Patient Active Problem List   Diagnosis Date Noted   Respiratory infection 01/16/2023   Genital herpes 05/13/2020   Social anxiety disorder 02/26/2020   Bipolar 2 disorder ([place]) 01/09/2020   Anxiety 01/09/2020   Mixed hyperlipidemia 02/14/2019   Hypothyroidism 09/26/2018   Fibromyalgia affecting multiple sites 05/28/2014    Social Hx   Social History   Socioeconomic History   Marital status: Married    Spouse name: Not on file   Number of children: Not on file   Years of education: Not on file   Highest education level: Not on file  Occupational History   Not on file  Tobacco Use   Smoking status: Former    Types: Cigarettes   Smokeless tobacco: Never   Tobacco comments:    quit feb 2016  Vaping Use   Vaping Use: Never used  Substance and Sexual Activity   Alcohol use: No    Alcohol/week: 0.0 standard drinks of alcohol   Drug use: No   Sexual activity: Yes    Birth control/protection: Surgical  Other Topics Concern   Not on file  Social History Narrative   Married since 1998.Lives with husband.Billing/Collections for  Copywriter, advertising.High school graduate.   Social Determinants of Health   Financial Resource Strain: Not on file  Food Insecurity: Not on file  Transportation Needs: Not on file  Physical Activity: Not on file  Stress: Not on file  Social Connections: Not on file    Review of Systems Per HPI  Objective:  BP 118/64   Pulse (!) 55   Temp 98.8 F (37.1 C)   Ht 5\' 3"  (1.6 m)   Wt 154 lb (69.9 kg)   SpO2 98%   BMI 27.28 kg/m      01/16/2023    9:21 AM 01/10/2023    3:15 PM 11/03/2022   11:04 AM  BP/Weight  Systolic BP [id number]  Diastolic BP 64 75 86  Wt. (Lbs) 154 155 156  BMI 27.28 kg/m2 27.46 kg/m2 27.63 kg/m2    Physical Exam Vitals and nursing note reviewed.  Constitutional:      Comments: Mildly ill-appearing.  HENT:     Head: Normocephalic and atraumatic.     Ears:     Comments: Right TM with effusion. Eyes:     General:        Right eye: No discharge.        Left eye: No discharge.     Conjunctiva/sclera: Conjunctivae normal.  Cardiovascular:     Rate and Rhythm: Tachycardia present.     Comments: Irregularity noted,  suspected ectopy. Pulmonary:     Effort: Pulmonary effort is normal.     Breath sounds: No wheezing.  Neurological:     Mental Status: She is alert.     Lab Results  Component Value Date   WBC 6.6 01/16/2023   HGB 14.2 01/16/2023   HCT 42.0 01/16/2023   PLT 282 01/16/2023   GLUCOSE 112 (H) 01/16/2023   CHOL 208 (H) 01/10/2023   TRIG 157 (H) 01/10/2023   HDL 47 01/10/2023   LDLCALC 133 (H) 01/10/2023   ALT 45 (H) 01/16/2023   AST 18 01/16/2023   NA 134 (L) 01/16/2023   K 4.0 01/16/2023   CL 100 01/16/2023   CREATININE 0.82 01/16/2023   BUN 9 01/16/2023   CO2 23 01/16/2023   TSH 0.071 (L) 01/10/2023   HGBA1C 5.4 01/27/2021   EKG: Interpretation sinus tachycardia at the rate of 104.  Frequent PVCs.  Nonspecific ST depression and T wave abnormality.  Assessment & Plan:   Problem List Items Addressed This Visit        Respiratory   Respiratory infection - Primary    Mildly ill-appearing today.  EKG was obtained given palpitations.  PVCs noted.  Chest x-ray was obtained and was independently reviewed by me.  No acute cardiopulmonary abnormalities.  Given persistent symptoms and lack of improvement, placing on doxycycline.      Other Visit Diagnoses     SOB (shortness of breath)       Relevant Orders   EKG 12-Lead (Completed)   CBC with Differential   Comprehensive metabolic panel   Troponin I -   DG Chest 2 View (Completed)       Meds ordered this encounter  Medications   doxycycline (VIBRA-TABS) 100 MG tablet    Sig: Take 1 tablet (100 mg total) by mouth 2 (two) times daily.    Dispense:  14 tablet    Refill:  0   levothyroxine (SYNTHROID) 75 MCG tablet    Sig: Take 1 tablet (75 mcg total) by mouth daily. Stop use of Liothyronine.    Dispense:  90 tablet    Refill:  0    Follow-up:  Return if symptoms worsen or fail to improve.  [place]

## 2023-01-16 NOTE — Assessment & Plan Note (Signed)
Mildly ill-appearing today.  EKG was obtained given palpitations.  PVCs noted.  Chest x-ray was obtained and was independently reviewed by me.  No acute cardiopulmonary abnormalities.  Given persistent symptoms and lack of improvement, placing on doxycycline.

## 2023-01-16 NOTE — Telephone Encounter (Signed)
Patient was seen this morning and was told to call back with medications she was taking for her thyroid/ Levothroxine 75 mg and Liothyront 3 20 mg

## 2023-01-16 NOTE — Patient Instructions (Signed)
Labs and xray today.  We will call with the results and go from there.  Take care  Dr. Lacinda Axon

## 2023-01-18 ENCOUNTER — Telehealth: Payer: Self-pay

## 2023-01-18 ENCOUNTER — Telehealth: Payer: Self-pay | Admitting: Family Medicine

## 2023-01-18 NOTE — Telephone Encounter (Signed)
Patient notified

## 2023-01-18 NOTE — Telephone Encounter (Signed)
Patient states feeling a little better but been going on for two week and she states still not quite there and would like a steriod called into [place]

## 2023-01-18 NOTE — Telephone Encounter (Signed)
Patient called and would like to double check on the troponin lab that was checked on 01/16/23 at the hospital to make sure this is normal because she saw it on mychart and was confused by what it reads. Please advise

## 2023-01-18 NOTE — Telephone Encounter (Signed)
Cook, [place] G, DO     I do not recommend steroid at this time.

## 2023-01-18 NOTE — Telephone Encounter (Signed)
Coral Spikes, DO     Results are overall reassuring. I discussed these with her when I spoke to her about her xray results.

## 2023-01-18 NOTE — Telephone Encounter (Signed)
Pt called wanting a call back about her lab results   Mary [name] [phone]

## 2023-01-19 ENCOUNTER — Telehealth: Payer: Self-pay | Admitting: Family Medicine

## 2023-01-19 NOTE — Telephone Encounter (Signed)
Patient  looked at her rest result on my chart and had some question about them. Please advise

## 2023-01-19 NOTE — Telephone Encounter (Signed)
Coral Spikes, DO   Spoke with patient.

## 2023-02-18 ENCOUNTER — Encounter (HOSPITAL_COMMUNITY): Payer: Self-pay | Admitting: Emergency Medicine

## 2023-02-18 ENCOUNTER — Emergency Department (HOSPITAL_COMMUNITY)
Admission: EM | Admit: 2023-02-18 | Discharge: 2023-02-18 | Disposition: A | Discharge status: Home / Self Care | Payer: No Typology Code available for payment source | Attending: Emergency Medicine | Admitting: Emergency Medicine

## 2023-02-18 ENCOUNTER — Emergency Department (HOSPITAL_COMMUNITY): Payer: No Typology Code available for payment source

## 2023-02-18 ENCOUNTER — Other Ambulatory Visit: Payer: Self-pay

## 2023-02-18 DIAGNOSIS — S82831A Other fracture of upper and lower end of right fibula, initial encounter for closed fracture: Secondary | ICD-10-CM | POA: Diagnosis not present

## 2023-02-18 DIAGNOSIS — W108XXA Fall (on) (from) other stairs and steps, initial encounter: Secondary | ICD-10-CM | POA: Diagnosis not present

## 2023-02-18 DIAGNOSIS — S8991XA Unspecified injury of right lower leg, initial encounter: Secondary | ICD-10-CM | POA: Diagnosis present

## 2023-02-18 MED ORDER — OXYCODONE-ACETAMINOPHEN 5-325 MG PO TABS: 1.0000 | ORAL_TABLET | ORAL | 0 refills | Status: DC | PRN

## 2023-02-18 MED ORDER — OXYCODONE-ACETAMINOPHEN 5-325 MG PO TABS
1.0000 | ORAL_TABLET | Freq: Once | ORAL | Status: AC
  Administered 2023-02-18: 1 via ORAL
  Filled 2023-02-18: qty 1

## 2023-02-18 NOTE — ED Provider Notes (Signed)
[place] EMERGENCY DEPARTMENT AT [facility] Provider Note   CSN: [id number] Arrival date & time: 02/18/23  1507     History  Chief Complaint  Patient presents with   Foot Injury    Mary [name] is a 57 y.o. female.  The history is provided by the patient. No language interpreter was used.  Foot Injury Location:  Ankle Time since incident:  1 hour Injury: yes   Mechanism of injury: fall   Fall:    Fall occurred:  Down stairs   Point of impact:  Unable to specify Ankle location:  R ankle Pain details:    Quality:  Aching   Radiates to:  Does not radiate   Severity:  Moderate   Onset quality:  Gradual   Timing:  Constant   Progression:  Worsening Chronicity:  New Dislocation: no   Foreign body present:  No foreign bodies Relieved by:  Nothing Worsened by:  Nothing Ineffective treatments:  None tried      Home Medications Prior to Admission medications   Medication Sig Start Date End Date Taking? Authorizing Provider  oxyCODONE-acetaminophen (PERCOCET) 5-325 MG tablet Take 1 tablet by mouth every 4 (four) hours as needed for severe pain. 02/18/23 02/18/24 Yes Elson Areas, PA-C  Ascorbic Acid (VITAMIN C) 1000 MG tablet Vitamin C    [provider]  Cholecalciferol (VITAMIN D3) 125 MCG (5000 UT) CAPS Take by mouth.    [provider]  doxycycline (VIBRA-TABS) 100 MG tablet Take 1 tablet (100 mg total) by mouth 2 (two) times daily. 01/16/23   Tommie Sams, DO  FLUoxetine (PROZAC) 20 MG capsule Take 1 capsule (20 mg total) by mouth daily. 08/31/22   Mozingo, Thereasa Solo, NP  lamoTRIgine (LAMICTAL) 100 MG tablet Take 1 tablet (100 mg total) by mouth daily. 08/31/22   Mozingo, Thereasa Solo, NP  levothyroxine (SYNTHROID) 75 MCG tablet Take 1 tablet (75 mcg total) by mouth daily. Stop use of Liothyronine. 01/16/23   Tommie Sams, DO  meloxicam (MOBIC) 15 MG tablet Take 1 tablet (15 mg total) by mouth daily. 01/10/23   Tommie Sams, DO  nortriptyline (PAMELOR) 10 MG capsule Take 1 capsule (10 mg total) by mouth at bedtime. 01/10/23   Tommie Sams, DO  progesterone (PROMETRIUM) 200 MG capsule Take 1 capsule by mouth at bedtime.    [provider]  Turmeric 500 MG CAPS Take by mouth.    [provider]  valACYclovir (VALTREX) 500 MG tablet Take 500 mg by mouth 2 (two) times daily. 03/08/21   [provider]      Allergies    Amoxicillin and Sulfa antibiotics    Review of Systems   Review of Systems  All other systems reviewed and are negative.   Physical Exam Updated Vital Signs BP 133/84 (BP Location: Right Arm)   Pulse 89   Temp 98.7 F (37.1 C) (Oral)   Resp 18   Ht 5\' 3"  (1.6 m)   Wt 69.4 kg   SpO2 96%   BMI 27.10 kg/m  Physical Exam Vitals and nursing note reviewed.  Constitutional:      Appearance: She is well-developed.  HENT:     Head: Normocephalic.  Pulmonary:     Effort: Pulmonary effort is normal.  Abdominal:     General: There is no distension.  Musculoskeletal:        General: Swelling and tenderness present.     Cervical back: Normal  range of motion.     Comments: Swollen right lateral ankle.  Tender to palpation nv and ns intact,  Bruised left upper arm and left elbow  nv and ns intact   Skin:    General: Skin is warm.  Neurological:     General: No focal deficit present.     Mental Status: She is alert and oriented to person, place, and time.  Psychiatric:        Mood and Affect: Mood normal.     ED Results / Procedures / Treatments   Labs (all labs ordered are listed, but only abnormal results are displayed) Labs Reviewed - No data to display  EKG None  Radiology DG Ankle Complete Right  Result Date: 02/18/2023 CLINICAL DATA:  Injury EXAM: RIGHT ANKLE - COMPLETE 3+ VIEW COMPARISON:  None Available. FINDINGS: There is an acute transverse nondisplaced fracture through the tip of the lateral malleolus with overlying soft tissue swelling.  Joint spaces are well maintained and alignment is anatomic. IMPRESSION: Acute transverse nondisplaced fracture through the tip of the lateral malleolus. Electronically Signed   By: Darliss Cheney M.D.   On: 02/18/2023 15:59    Procedures Procedures    Medications Ordered in ED Medications  oxyCODONE-acetaminophen (PERCOCET/ROXICET) 5-325 MG per tablet 1 tablet (1 tablet Oral Given 02/18/23 1545)    ED Course/ Medical Decision Making/ A&P                             Medical Decision Making Amount and/or Complexity of Data Reviewed Radiology: ordered.  Risk Prescription drug management.           Final Clinical Impression(s) / ED Diagnoses Final diagnoses:  Closed fracture of distal end of right fibula, unspecified fracture morphology, initial encounter    Rx / DC Orders ED Discharge Orders          Ordered    oxyCODONE-acetaminophen (PERCOCET) 5-325 MG tablet  Every 4 hours PRN        02/18/23 1628           An After Visit Summary was printed and given to the patient.    Elson Areas, [place] 02/18/23 1653    Bethann Berkshire, MD 02/19/23 1053

## 2023-02-18 NOTE — ED Notes (Signed)
PA-C inspected short leg splint, splint re-done, pt able to wiggle her toes, CNS intact

## 2023-02-18 NOTE — ED Triage Notes (Signed)
[place] the park and [place] on curb and twisted right ankle. Pt felt a pop and unable to ambulate. Swelling noted. Ice applied.

## 2023-02-18 NOTE — Discharge Instructions (Addendum)
See the Orthopaedist for recheck in 1 week  °

## 2023-02-20 ENCOUNTER — Encounter: Payer: Self-pay | Admitting: Orthopedic Surgery

## 2023-02-20 ENCOUNTER — Ambulatory Visit (INDEPENDENT_AMBULATORY_CARE_PROVIDER_SITE_OTHER): Payer: No Typology Code available for payment source | Admitting: Orthopedic Surgery

## 2023-02-20 ENCOUNTER — Telehealth: Payer: Self-pay

## 2023-02-20 ENCOUNTER — Telehealth: Payer: Self-pay | Admitting: Orthopedic Surgery

## 2023-02-20 VITALS — BP 110/78 | HR 97 | Ht 63.0 in | Wt 153.0 lb

## 2023-02-20 DIAGNOSIS — S82831A Other fracture of upper and lower end of right fibula, initial encounter for closed fracture: Secondary | ICD-10-CM | POA: Diagnosis not present

## 2023-02-20 MED ORDER — IBUPROFEN 800 MG PO TABS: 800.0000 mg | ORAL_TABLET | Freq: Three times a day (TID) | ORAL | 1 refills | Status: DC | PRN

## 2023-02-20 MED ORDER — HYDROCODONE-ACETAMINOPHEN 5-325 MG PO TABS: 1.0000 | ORAL_TABLET | Freq: Four times a day (QID) | ORAL | 0 refills | Status: DC | PRN

## 2023-02-20 NOTE — Telephone Encounter (Signed)
Dr. Mort Sawyers pt - pt lvm stating she's in a lot of pain, more than she was earlier today, she would like a call back.

## 2023-02-20 NOTE — Telephone Encounter (Signed)
Ok to provide a work note out of work until next visit per  Dr Romeo Apple

## 2023-02-20 NOTE — Telephone Encounter (Signed)
Dr. Mort Sawyers pt - spoke w/the pt, she was just seen this morning and called back and stated that she forgot to ask about a work note.  She stated her job is going to let her work from home, but she needs a note stating she needs to work from home because she can't drive.

## 2023-02-20 NOTE — Progress Notes (Signed)
Type of appointment: New problem established patient  SUMMARY:   Encounter Diagnosis  Name Primary?   Other closed fracture of distal end of right fibula, initial encounter Yes    57 year old female presents with a transverse distal fibular fracture nondisplaced intact ankle mortise.  Mild to moderate swelling and moderate pain  Patient is allowed weightbearing as tolerated as soon as she can with the assistance of a walking boot.  Meds ordered this encounter  Medications   ibuprofen (ADVIL) 800 MG tablet    Sig: Take 1 tablet (800 mg total) by mouth every 8 (eight) hours as needed.    Dispense:  90 tablet    Refill:  1   HYDROcodone-acetaminophen (NORCO/VICODIN) 5-325 MG tablet    Sig: Take 1 tablet by mouth every 6 (six) hours as needed for moderate pain.    Dispense:  30 tablet    Refill:  0     Chief Complaint  Patient presents with   Ankle Injury    02/18/23 right     57 year old female injured her right ankle at a playground on 02/18/2023 went to the emergency room at x-rays was placed in a posterior splint.  X-rays show transverse fracture distal tip of the fibula with intact ankle mortise complains of moderate to severe pain and swelling    Review of Systems  Musculoskeletal:  Positive for joint pain and myalgias.    Body mass index is 27.1 kg/m.   Physical Exam Vitals and nursing note reviewed.  Constitutional:      Appearance: Normal appearance.  HENT:     Head: Normocephalic and atraumatic.  Eyes:     General: No scleral icterus.       Right eye: No discharge.        Left eye: No discharge.     Extraocular Movements: Extraocular movements intact.     Conjunctiva/sclera: Conjunctivae normal.     Pupils: Pupils are equal, round, and reactive to light.  Cardiovascular:     Rate and Rhythm: Normal rate.     Pulses: Normal pulses.  Skin:    General: Skin is warm and dry.     Capillary Refill: Capillary refill takes less than 2 seconds.   Neurological:     General: No focal deficit present.     Mental Status: She is alert and oriented to person, place, and time.  Psychiatric:        Mood and Affect: Mood normal.        Behavior: Behavior normal.        Thought Content: Thought content normal.        Judgment: Judgment normal.    Right ankle tenderness at the distal tip of the fibula there is swelling around the ankle joint she has trouble getting the foot into the neutral dorsiflexion position  She may not be able to wear the boot and may have to wear a splint again  Medication as needed  Follow-up in 4 weeks repeat x-ray    Constitutional:  BP 110/78   Pulse 97   Ht 5\' 3"  (1.6 m)   Wt 153 lb (69.4 kg)   BMI 27.10 kg/m    Past Medical History:  Diagnosis Date   Fibromyalgia    Migraine    Thyroid disease    Past Surgical History:  Procedure Laterality Date   TONSILLECTOMY     TUBAL LIGATION     uterine ablation  2004   irregular heavy periods.  Social History   Tobacco Use   Smoking status: Former    Types: Cigarettes   Smokeless tobacco: Never   Tobacco comments:    quit feb 2016  Vaping Use   Vaping Use: Never used  Substance Use Topics   Alcohol use: No    Alcohol/week: 0.0 standard drinks of alcohol   Drug use: No     Assessment and Plan:  Imaging: I have personally reviewed the images and my personal interpretation of the images: Transverse fracture distal tip of the fibula  Encounter Diagnosis  Name Primary?   Other closed fracture of distal end of right fibula, initial encounter Yes    Meds ordered this encounter  Medications   ibuprofen (ADVIL) 800 MG tablet    Sig: Take 1 tablet (800 mg total) by mouth every 8 (eight) hours as needed.    Dispense:  90 tablet    Refill:  1   HYDROcodone-acetaminophen (NORCO/VICODIN) 5-325 MG tablet    Sig: Take 1 tablet by mouth every 6 (six) hours as needed for moderate pain.    Dispense:  30 tablet    Refill:  0

## 2023-02-20 NOTE — Transitions of Care (Post Inpatient/ED Visit) (Unsigned)
   02/20/2023  Name: BELYNDA PAGADUAN MRN: [id number] DOB: 08-03-1966  Today's TOC FU Call Status: Today's TOC FU Call Status:: Unsuccessul Call (1st Attempt) Unsuccessful Call (1st Attempt) Date: 02/20/23  Attempted to reach the patient regarding the most recent Inpatient/ED visit.  Follow Up Plan: Additional outreach attempts will be made to reach the patient to complete the Transitions of Care (Post Inpatient/ED visit) call.   Signature Karena Addison, LPN [facility] Nurse Health Advisor Direct Dial [phone]

## 2023-02-21 ENCOUNTER — Encounter: Payer: Self-pay | Admitting: Orthopedic Surgery

## 2023-02-21 NOTE — Transitions of Care (Post Inpatient/ED Visit) (Signed)
   02/21/2023  Name: Mary [name] MRN: [id number] DOB: 1966-03-02  Today's TOC FU Call Status: Today's TOC FU Call Status:: Unsuccessful Call (2nd Attempt) Unsuccessful Call (1st Attempt) Date: 02/20/23 Unsuccessful Call (2nd Attempt) Date: 02/21/23  Attempted to reach the patient regarding the most recent Inpatient/ED visit.  Follow Up Plan: No further outreach attempts will be made at this time. We have been unable to contact the patient.  Signature Karena Addison, LPN [facility] Nurse Health Advisor Direct Dial [phone]

## 2023-02-22 ENCOUNTER — Encounter: Payer: Self-pay | Admitting: Orthopedic Surgery

## 2023-02-27 ENCOUNTER — Encounter: Payer: Self-pay | Admitting: Orthopedic Surgery

## 2023-02-27 ENCOUNTER — Ambulatory Visit (INDEPENDENT_AMBULATORY_CARE_PROVIDER_SITE_OTHER): Payer: No Typology Code available for payment source | Admitting: Orthopedic Surgery

## 2023-02-27 DIAGNOSIS — S82831D Other fracture of upper and lower end of right fibula, subsequent encounter for closed fracture with routine healing: Secondary | ICD-10-CM

## 2023-02-27 NOTE — Progress Notes (Signed)
Chief Complaint  Patient presents with   Routine Post Op    Right Ankle DOI 02/18/23    The patient has a fracture of the fibula she was placed in a cam walker but it did not fit she could not get her foot to 90 degrees due to the initial splint being placed with the foot in plantarflexion   IMPRESSION: Acute transverse nondisplaced fracture through the tip of the lateral malleolus.  After ice elevation she comes in for reevaluation to try to get the food into a boot  It looks like we can go ahead and do that and then follow-up in 4 weeks for x-ray  Current meds ibuprofen and hydrocodone

## 2023-02-27 NOTE — Patient Instructions (Addendum)
Work note to work from home next 4 weeks  Weight-bear as to tolerated in the boot  Use the scooter as needed

## 2023-03-06 ENCOUNTER — Encounter: Payer: Self-pay | Admitting: Family Medicine

## 2023-03-06 ENCOUNTER — Ambulatory Visit (INDEPENDENT_AMBULATORY_CARE_PROVIDER_SITE_OTHER): Payer: No Typology Code available for payment source | Admitting: Nurse Practitioner

## 2023-03-06 VITALS — BP 106/75 | Ht 63.0 in | Wt 153.0 lb

## 2023-03-06 DIAGNOSIS — R21 Rash and other nonspecific skin eruption: Secondary | ICD-10-CM | POA: Diagnosis not present

## 2023-03-06 MED ORDER — PREDNISONE 20 MG PO TABS: ORAL_TABLET | ORAL | 0 refills | Status: DC

## 2023-03-06 MED ORDER — TRIAMCINOLONE ACETONIDE 0.1 % EX CREA: 1.0000 | TOPICAL_CREAM | Freq: Two times a day (BID) | CUTANEOUS | 0 refills | Status: DC

## 2023-03-06 MED ORDER — METHYLPREDNISOLONE ACETATE 40 MG/ML IJ SUSP
40.0000 mg | Freq: Once | INTRAMUSCULAR | Status: AC
  Administered 2023-03-06: 40 mg via INTRAMUSCULAR

## 2023-03-06 NOTE — Patient Instructions (Signed)
Allegra or Claritin in the morning Pepcid twice a day Benadryl at night

## 2023-03-06 NOTE — Progress Notes (Addendum)
Subjective:    Patient ID: Mary [name], female    DOB: 06/07/1966, 57 y.o.   MRN: [id number]  HPI  Patient states she woke up with itchy rash this morning- worse on face Very pruritic with stinging and burning behind the ears.  No known contacts.  No known allergens.  Cannot identify any specific triggers.  Was given a brand-new blanket yesterday that was in an airtight seal.  Rash started on face and during the day she has noticed several lesions coming up on the body.  No fever.  Has not tried anything so far.  No wheezing or trouble breathing.    03/06/2023    3:31 PM  Depression screen PHQ 2/9  Decreased Interest 2  Down, Depressed, Hopeless 2  PHQ - 2 Score 4  Altered sleeping 2  Tired, decreased energy 2  Change in appetite 2  Feeling bad or failure about yourself  0  Trouble concentrating 2  Moving slowly or fidgety/restless 0  Suicidal thoughts 0  PHQ-9 Score 12  Difficult doing work/chores Very difficult      03/06/2023    3:32 PM 01/10/2023    3:36 PM 12/18/2018   10:28 AM  GAD 7 : Generalized Anxiety Score  Nervous, Anxious, on Edge 2 0 3  Control/stop worrying 2 0 0  Worry too much - different things 2 1 0  Trouble relaxing 2 1 3   Restless 0 0 0  Easily annoyed or irritable 2 0 3  Afraid - awful might happen 0 0 0  Total GAD 7 Score 10 2 9   Anxiety Difficulty Very difficult Somewhat difficult Somewhat difficult          Objective:   Physical Exam NAD.  Alert, oriented.  Lungs clear.  Heart regular rate rhythm.  Multiple discrete pink lesions noted on both sides of the face and along the midline with a couple of lesions having a slightly elevated pale center.  The remainder of the lesions are pink maculopapular rash on different areas of the body including back of the neck and on the torso/arms.  Skin behind both ears extremely erythematous. Today's Vitals   03/06/23 1530  BP: 106/75  Weight: 153 lb (69.4 kg)  Height: 5\' 3"  (1.6 m)   Body mass  index is 27.1 kg/m.        Assessment & Plan:  Rash and nonspecific skin eruption - Plan: methylPREDNISolone acetate (DEPO-MEDROL) injection 40 mg Most likely an allergic reaction.  No evidence of zoster or bacterial infection. Meds ordered this encounter  Medications   predniSONE (DELTASONE) 20 MG tablet    Sig: 3 po qd x 3 d then 2 po qd x 3 d then 1 po qd x 2 d    Dispense:  17 tablet    Refill:  0    Start 5/14 am    Order Specific Question:   Supervising Provider    Answer:   Lilyan Punt A [[id number]]   triamcinolone cream (KENALOG) 0.1 %    Sig: Apply 1 Application topically 2 (two) times daily. Prn rash; use up to 2 weeks    Dispense:  30 g    Refill:  0    Order Specific Question:   Supervising Provider    Answer:   Lilyan Punt A [[id number]]   methylPREDNISolone acetate (DEPO-MEDROL) injection 40 mg   Depo-Medrol 40 mg IM now.  Tomorrow start prednisone taper as directed.  Triamcinolone to rash as directed as needed.  Start Allegra or Claritin in the morning and Benadryl at night.  Pepcid as directed twice daily.  Warning signs reviewed.  Call back by the end of the week if no significant improvement, sooner if worse. Note that patient is struggling with anxiety and depression which she relates to her recent fall and difficulty ambulating.  Defers any changes in her medication regimen at this time.  Follow-up if symptoms persist.

## 2023-03-07 ENCOUNTER — Telehealth: Payer: Self-pay

## 2023-03-07 ENCOUNTER — Other Ambulatory Visit: Payer: Self-pay | Admitting: Family Medicine

## 2023-03-07 MED ORDER — HYDROXYZINE PAMOATE 25 MG PO CAPS: 25.0000 mg | ORAL_CAPSULE | Freq: Three times a day (TID) | ORAL | 0 refills | Status: DC | PRN

## 2023-03-07 NOTE — Telephone Encounter (Signed)
Pt was seen yesterday for rash and Carolyn sent prednisone but patient is needing something for itching sent to   [facility] [phone]

## 2023-03-16 ENCOUNTER — Encounter: Payer: Self-pay | Admitting: Orthopedic Surgery

## 2023-03-26 ENCOUNTER — Other Ambulatory Visit: Payer: Self-pay | Admitting: Adult Health

## 2023-03-26 DIAGNOSIS — F4010 Social phobia, unspecified: Secondary | ICD-10-CM

## 2023-03-26 DIAGNOSIS — F331 Major depressive disorder, recurrent, moderate: Secondary | ICD-10-CM

## 2023-03-26 NOTE — Telephone Encounter (Signed)
Sent My-Chart message

## 2023-03-27 ENCOUNTER — Other Ambulatory Visit (INDEPENDENT_AMBULATORY_CARE_PROVIDER_SITE_OTHER): Payer: No Typology Code available for payment source

## 2023-03-27 ENCOUNTER — Ambulatory Visit (INDEPENDENT_AMBULATORY_CARE_PROVIDER_SITE_OTHER): Payer: No Typology Code available for payment source | Admitting: Orthopedic Surgery

## 2023-03-27 DIAGNOSIS — S82831D Other fracture of upper and lower end of right fibula, subsequent encounter for closed fracture with routine healing: Secondary | ICD-10-CM

## 2023-03-27 NOTE — Patient Instructions (Signed)
Weaning schedule for removing the cam walking boot  Start with walking in the home 1 hour/day without the walking boot  Increase this as tolerated until you can walk in the house without the boot  When she can walk in the house without the boot try walking out of the house with out the boot (but bring the boot with you when you leave the house)   Exercise schedule  Draw the alphabet with your foot twice a day going from A to Z and Z to A 5 times

## 2023-03-27 NOTE — Progress Notes (Signed)
Frax f/u  Chief Complaint  Patient presents with   Ankle Injury    02/18/23 right ankle fracture    Fracture    Rt ankle    58 year old status post transverse fracture of the distal lateral malleolus  She has now had at least 37 days of immobilization in a cam walker and a splint  At this point is time to wean her out of the cam walker and start alphabet exercises  Ortho Exam:  Mild swelling left leg and mild tenderness but her range of motion is improving  X-rays: Transverse fracture line distal fibula stable this is probably fibrous union at this point  Encounter Diagnosis  Name Primary?   Other closed fracture of distal end of right fibula with routine healing, subsequent encounter Yes     So we will start with alphabet exercises twice a day  And will also start weaning her out of the cam walker by letting her walk in the home at least an hour a day without the walking boot increasing that as tolerated until she no longer needs the cam walker in the home  Once that is accomplished she can wear regular shoes out of the home  Return in 4 weeks

## 2023-04-05 ENCOUNTER — Encounter: Payer: Self-pay | Admitting: Adult Health

## 2023-04-05 ENCOUNTER — Telehealth (INDEPENDENT_AMBULATORY_CARE_PROVIDER_SITE_OTHER): Payer: No Typology Code available for payment source | Admitting: Adult Health

## 2023-04-05 ENCOUNTER — Telehealth: Payer: Self-pay | Admitting: Adult Health

## 2023-04-05 DIAGNOSIS — F411 Generalized anxiety disorder: Secondary | ICD-10-CM

## 2023-04-05 DIAGNOSIS — F4010 Social phobia, unspecified: Secondary | ICD-10-CM | POA: Diagnosis not present

## 2023-04-05 DIAGNOSIS — F331 Major depressive disorder, recurrent, moderate: Secondary | ICD-10-CM

## 2023-04-05 MED ORDER — LAMOTRIGINE 100 MG PO TABS: 100.0000 mg | ORAL_TABLET | Freq: Every day | ORAL | 3 refills | Status: DC

## 2023-04-05 MED ORDER — FLUOXETINE HCL 20 MG PO CAPS: 20.0000 mg | ORAL_CAPSULE | Freq: Every day | ORAL | 3 refills | Status: DC

## 2023-04-05 NOTE — Progress Notes (Signed)
Mary [name] [id number] 16-Jul-1966 57 y.o.  Virtual Visit via Video Note  I connected with pt @ on 04/05/23 at 12:00 PM EDT by a video enabled telemedicine application and verified that I am speaking with the correct person using two identifiers.   I discussed the limitations of evaluation and management by telemedicine and the availability of in person appointments. The patient expressed understanding and agreed to proceed.  I discussed the assessment and treatment plan with the patient. The patient was provided an opportunity to ask questions and all were answered. The patient agreed with the plan and demonstrated an understanding of the instructions.   The patient was advised to call back or seek an in-person evaluation if the symptoms worsen or if the condition fails to improve as anticipated.  I provided 15 minutes of non-face-to-face time during this encounter.  The patient was located at home.  The provider was located at [facility].   Dorothyann Gibbs, NP   Subjective:   Patient ID:  Mary [name] is a 57 y.o. (DOB [birth year]) female.  Chief Complaint: No chief complaint on file.   HPI Mary [name] presents for follow-up of MDD, GAD and SAD.  Describes mood today as "ok". Pleasant. Denies tearfulness. Mood symptoms - denies depression, anxiety and irritability. Reports social anxiety. Mood is consistent. Stating "I feel like I'm doing alright". Feels like current medication regimen works well. Family doing well. Stable interest and motivation. Taking medications as prescribed.  Energy levels vary. Active, does not have a regular exercise routine with current physical disabilities.  Enjoys some usual interests and activities. Married x 24 years. Lives with husband. Has 3 grown children. Mother local. Spending time with family. Appetite adequate. Weight gain 5 pounds with inactivity - broken ankle. Sleeping well most nights. Averages 8  hours. Focus and concentration improved. Completing tasks. Managing aspects of household. Works full-time. Denies SI or HI.  Denies AH or VH.  Previous medication trials: Wellbutrin XL, SSRI's, Cymbalta  Review of Systems:  Review of Systems  Musculoskeletal:  Negative for gait problem.  Neurological:  Negative for tremors.  Psychiatric/Behavioral:         Please refer to HPI    Medications: I have reviewed the patient's current medications.  Current Outpatient Medications  Medication Sig Dispense Refill   ARMOUR THYROID 60 MG tablet Take 60 mg by mouth daily.     Ascorbic Acid (VITAMIN C) 1000 MG tablet Vitamin C     Cholecalciferol (VITAMIN D3) 125 MCG (5000 UT) CAPS Take by mouth.     FLUoxetine (PROZAC) 20 MG capsule Take 1 capsule by mouth once daily 30 capsule 0   HYDROcodone-acetaminophen (NORCO/VICODIN) 5-325 MG tablet Take 1 tablet by mouth every 6 (six) hours as needed for moderate pain. 30 tablet 0   hydrOXYzine (VISTARIL) 25 MG capsule Take 1 capsule (25 mg total) by mouth every 8 (eight) hours as needed for itching. 30 capsule 0   ibuprofen (ADVIL) 800 MG tablet Take 1 tablet (800 mg total) by mouth every 8 (eight) hours as needed. 90 tablet 1   lamoTRIgine (LAMICTAL) 100 MG tablet Take 1 tablet by mouth once daily 30 tablet 0   meloxicam (MOBIC) 15 MG tablet Take 1 tablet (15 mg total) by mouth daily. 30 tablet 3   nortriptyline (PAMELOR) 10 MG capsule Take 1 capsule (10 mg total) by mouth at bedtime. 30 capsule 3   predniSONE (DELTASONE) 20 MG tablet 3 po qd x 3  d then 2 po qd x 3 d then 1 po qd x 2 d 17 tablet 0   triamcinolone cream (KENALOG) 0.1 % Apply 1 Application topically 2 (two) times daily. Prn rash; use up to 2 weeks 30 g 0   valACYclovir (VALTREX) 500 MG tablet Take 500 mg by mouth 2 (two) times daily.     No current facility-administered medications for this visit.    Medication Side Effects: None  Allergies:  Allergies  Allergen Reactions    Amoxicillin    Sulfa Antibiotics Other (See Comments)    Lip swelling    Past Medical History:  Diagnosis Date   Fibromyalgia    Migraine    Thyroid disease     Family History  Problem Relation Age of Onset   Heart disease Mother    Depression Mother    Heart disease Father    Depression Father    Depression Maternal Uncle     Social History   Socioeconomic History   Marital status: Married    Spouse name: Not on file   Number of children: Not on file   Years of education: Not on file   Highest education level: 12th grade  Occupational History   Not on file  Tobacco Use   Smoking status: Former    Types: Cigarettes   Smokeless tobacco: Never   Tobacco comments:    quit feb 2016  Vaping Use   Vaping Use: Never used  Substance and Sexual Activity   Alcohol use: No    Alcohol/week: 0.0 standard drinks of alcohol   Drug use: No   Sexual activity: Yes    Birth control/protection: Surgical  Other Topics Concern   Not on file  Social History Narrative   Married since 1998.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   Social Determinants of Health   Financial Resource Strain: Medium Risk (03/06/2023)   Overall Financial Resource Strain (CARDIA)    Difficulty of Paying Living Expenses: Somewhat hard  Food Insecurity: No Food Insecurity (03/06/2023)   Hunger Vital Sign    Worried About Running Out of Food in the Last Year: Never true    Ran Out of Food in the Last Year: Never true  Transportation Needs: No Transportation Needs (03/06/2023)   PRAPARE - Administrator, Civil Service (Medical): No    Lack of Transportation (Non-Medical): No  Physical Activity: Insufficiently Active (03/06/2023)   Exercise Vital Sign    Days of Exercise per Week: 2 days    Minutes of Exercise per Session: 20 min  Stress: Stress Concern Present (03/06/2023)   [employer] of Occupational Health - Occupational Stress  Questionnaire    Feeling of Stress : To some extent  Social Connections: Socially Integrated (03/06/2023)   Social Connection and Isolation Panel [NHANES]    Frequency of Communication with Friends and Family: More than three times a week    Frequency of Social Gatherings with Friends and Family: Twice a week    Attends Religious Services: More than 4 times per year    Active Member of [employer] or Organizations: Yes    Attends Engineer, structural: More than 4 times per year    Marital Status: Married  Catering manager Violence: Not on file    Past Medical History, Surgical history, Social history, and Family history were reviewed and updated as appropriate.   Please see review of systems for further details on the patient's review from today.  Objective:   Physical Exam:  There were no vitals taken for this visit.  Physical Exam Constitutional:      General: She is not in acute distress. Musculoskeletal:        General: No deformity.  Neurological:     Mental Status: She is alert and oriented to person, place, and time.     Coordination: Coordination normal.  Psychiatric:        Attention and Perception: Attention and perception normal. She does not perceive auditory or visual hallucinations.        Mood and Affect: Mood normal. Mood is not anxious or depressed. Affect is not labile, blunt, angry or inappropriate.        Speech: Speech normal.        Behavior: Behavior normal.        Thought Content: Thought content normal. Thought content is not paranoid or delusional. Thought content does not include homicidal or suicidal ideation. Thought content does not include homicidal or suicidal plan.        Cognition and Memory: Cognition and memory normal.        Judgment: Judgment normal.     Comments: Insight intact     Lab Review:     Component Value Date/Time   NA 134 (L) 01/16/2023 1055   NA 141 01/10/2023 1652   K 4.0 01/16/2023 1055   CL 100 01/16/2023 1055    CO2 23 01/16/2023 1055   GLUCOSE 112 (H) 01/16/2023 1055   BUN 9 01/16/2023 1055   BUN 17 01/10/2023 1652   CREATININE 0.82 01/16/2023 1055   CREATININE 0.68 01/27/2021 1750   CALCIUM 8.6 (L) 01/16/2023 1055   PROT 7.2 01/16/2023 1055   PROT 7.0 01/10/2023 1652   ALBUMIN 4.0 01/16/2023 1055   ALBUMIN 4.6 01/10/2023 1652   AST 18 01/16/2023 1055   ALT 45 (H) 01/16/2023 1055   ALKPHOS 92 01/16/2023 1055   BILITOT 0.6 01/16/2023 1055   BILITOT <0.2 01/10/2023 1652   GFRNONAA >60 01/16/2023 1055   GFRNONAA 100 01/27/2021 1750   GFRAA 116 01/27/2021 1750       Component Value Date/Time   WBC 6.6 01/16/2023 1055   RBC 4.46 01/16/2023 1055   HGB 14.2 01/16/2023 1055   HGB 14.4 01/10/2023 1652   HCT 42.0 01/16/2023 1055   HCT 41.9 01/10/2023 1652   PLT 282 01/16/2023 1055   PLT 350 01/10/2023 1652   MCV 94.2 01/16/2023 1055   MCV 94 01/10/2023 1652   MCH 31.8 01/16/2023 1055   MCHC 33.8 01/16/2023 1055   RDW 11.4 (L) 01/16/2023 1055   RDW 11.6 (L) 01/10/2023 1652   LYMPHSABS 1.3 01/16/2023 1055   LYMPHSABS 2.1 04/19/2018 0830   MONOABS 0.6 01/16/2023 1055   EOSABS 0.1 01/16/2023 1055   EOSABS 0.1 04/19/2018 0830   BASOSABS 0.0 01/16/2023 1055   BASOSABS 0.0 04/19/2018 0830    No results found for: "POCLITH", "LITHIUM"   No results found for: "PHENYTOIN", "PHENOBARB", "VALPROATE", "CBMZ"   .res Assessment: Plan:    Plan:  PDMP reviewed:  Lamictal 100mg  daily Prozac 20mg  daily   RTC 6 months  Patient advised to contact office with any questions, adverse effects, or acute worsening in signs and symptoms.  Discussed potential metabolic side effects associated with atypical antipsychotics, as well as potential risk for movement side effects. Advised pt to contact office if movement side effects occur.    Counseled patient regarding potential benefits, risks, and side effects of  Lamictal to include potential risk of Stevens-Johnson syndrome. Advised patient to stop  taking Lamictal and contact office immediately if rash develops and to seek urgent medical attention if rash is severe and/or spreading quickly.   There are no diagnoses linked to this encounter.   Please see After Visit Summary for patient specific instructions.  Future Appointments  Date Time Provider Department Center  04/05/2023 12:00 PM Tonja Jezewski, Thereasa Solo, NP [facility] None  04/12/2023  2:50 PM Tommie Sams, DO [facility]    No orders of the defined types were placed in this encounter.     -------------------------------

## 2023-04-05 NOTE — Telephone Encounter (Signed)
I don't prescribe that medication.  

## 2023-04-05 NOTE — Telephone Encounter (Signed)
Mary [name] was just seen by you and her Nortriptyline was not called into [place]. Can you please call this in?   [facility] - [place], [place] - [address]   Phone: [phone]  Fax: [phone]

## 2023-04-12 ENCOUNTER — Encounter: Payer: Self-pay | Admitting: Family Medicine

## 2023-04-12 ENCOUNTER — Ambulatory Visit (INDEPENDENT_AMBULATORY_CARE_PROVIDER_SITE_OTHER): Payer: No Typology Code available for payment source | Admitting: Family Medicine

## 2023-04-12 VITALS — BP 109/76 | HR 82 | Temp 98.2°F | Ht 63.0 in | Wt 158.0 lb

## 2023-04-12 DIAGNOSIS — M797 Fibromyalgia: Secondary | ICD-10-CM | POA: Diagnosis not present

## 2023-04-12 NOTE — Patient Instructions (Signed)
If you change your mind, let me know.  Continue your medications.  Stay active. Healthy diet.

## 2023-04-13 NOTE — Assessment & Plan Note (Signed)
Improved. We had a lengthy discussion today about the remote possibility of serotonin syndrome given serotonergic activity of nortriptyline as well as fluoxetine.  Patient elected to continue current dosing of nortriptyline.

## 2023-04-13 NOTE — Progress Notes (Signed)
Subjective:  Patient ID: Mary [name], female    DOB: [birth year]  Age: 57 y.o. MRN: [id number]  CC: Chief Complaint  Patient presents with   Fibromyalgia    Discuss medication    HPI:  57 year old female presents for evaluation of the above.  Patient has had improvement in her pain with addition of nortriptyline.  She would like to discuss possible increase.  She is on fluoxetine and Lamictal.  Discussed possibility of serotonin syndrome with higher doses of nortriptyline in addition to fluoxetine.  She states that she has improvement with exercise as well.  She is currently limited in exercise as she has recently had a distal fibula fracture.  Patient Active Problem List   Diagnosis Date Noted   Respiratory infection 01/16/2023   Genital herpes 05/13/2020   Social anxiety disorder 02/26/2020   Bipolar 2 disorder (HCC) 01/09/2020   Anxiety 01/09/2020   Mixed hyperlipidemia 02/14/2019   Hypothyroidism 09/26/2018   Fibromyalgia affecting multiple sites 05/28/2014    Social Hx   Social History   Socioeconomic History   Marital status: Married    Spouse name: Not on file   Number of children: Not on file   Years of education: Not on file   Highest education level: 12th grade  Occupational History   Not on file  Tobacco Use   Smoking status: Former    Types: Cigarettes   Smokeless tobacco: Never   Tobacco comments:    quit feb 2016  Vaping Use   Vaping Use: Never used  Substance and Sexual Activity   Alcohol use: No    Alcohol/week: 0.0 standard drinks of alcohol   Drug use: No   Sexual activity: Yes    Birth control/protection: Surgical  Other Topics Concern   Not on file  Social History Narrative   Married since 1998.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   Social Determinants of Health   Financial Resource Strain: Medium Risk (03/06/2023)   Overall Financial Resource Strain (CARDIA)    Difficulty of  Paying Living Expenses: Somewhat hard  Food Insecurity: No Food Insecurity (03/06/2023)   Hunger Vital Sign    Worried About Running Out of Food in the Last Year: Never true    Ran Out of Food in the Last Year: Never true  Transportation Needs: No Transportation Needs (03/06/2023)   PRAPARE - Administrator, Civil Service (Medical): No    Lack of Transportation (Non-Medical): No  Physical Activity: Insufficiently Active (03/06/2023)   Exercise Vital Sign    Days of Exercise per Week: 2 days    Minutes of Exercise per Session: 20 min  Stress: Stress Concern Present (03/06/2023)   [employer] of Occupational Health - Occupational Stress Questionnaire    Feeling of Stress : To some extent  Social Connections: Socially Integrated (03/06/2023)   Social Connection and Isolation Panel [NHANES]    Frequency of Communication with Friends and Family: More than three times a week    Frequency of Social Gatherings with Friends and Family: Twice a week    Attends Religious Services: More than 4 times per year    Active Member of [employer] or Organizations: Yes    Attends Engineer, structural: More than 4 times per year    Marital Status: Married    Review of Systems Per HPI  Objective:  BP 109/76   Pulse 82   Temp 98.2 F (36.8 C)   Ht 5\' 3"  (1.6 m)  Wt 158 lb (71.7 kg)   SpO2 96%   BMI 27.99 kg/m      04/12/2023    2:49 PM 03/06/2023    3:30 PM 02/20/2023   10:21 AM  BP/Weight  Systolic BP 109 106 110  Diastolic BP 76 75 78  Wt. (Lbs) 158 153 153  BMI 27.99 kg/m2 27.1 kg/m2 27.1 kg/m2    Physical Exam Vitals and nursing note reviewed.  Constitutional:      General: She is not in acute distress.    Appearance: Normal appearance.  HENT:     Head: Normocephalic and atraumatic.  Eyes:     General:        Right eye: No discharge.        Left eye: No discharge.     Conjunctiva/sclera: Conjunctivae normal.  Pulmonary:     Effort: Pulmonary effort is  normal. No respiratory distress.  Neurological:     Mental Status: She is alert.  Psychiatric:        Mood and Affect: Mood normal.        Behavior: Behavior normal.     Lab Results  Component Value Date   WBC 6.6 01/16/2023   HGB 14.2 01/16/2023   HCT 42.0 01/16/2023   PLT 282 01/16/2023   GLUCOSE 112 (H) 01/16/2023   CHOL 208 (H) 01/10/2023   TRIG 157 (H) 01/10/2023   HDL 47 01/10/2023   LDLCALC 133 (H) 01/10/2023   ALT 45 (H) 01/16/2023   AST 18 01/16/2023   NA 134 (L) 01/16/2023   K 4.0 01/16/2023   CL 100 01/16/2023   CREATININE 0.82 01/16/2023   BUN 9 01/16/2023   CO2 23 01/16/2023   TSH 0.071 (L) 01/10/2023   HGBA1C 5.4 01/27/2021     Assessment & Plan:   Problem List Items Addressed This Visit       Other   Fibromyalgia affecting multiple sites - Primary    Improved. We had a lengthy discussion today about the remote possibility of serotonin syndrome given serotonergic activity of nortriptyline as well as fluoxetine.  Patient elected to continue current dosing of nortriptyline.      Follow-up:  Return in about 6 months (around 10/12/2023).  Everlene Other DO [facility]

## 2023-04-27 ENCOUNTER — Other Ambulatory Visit: Payer: Self-pay | Admitting: Adult Health

## 2023-04-27 DIAGNOSIS — F4010 Social phobia, unspecified: Secondary | ICD-10-CM

## 2023-04-27 DIAGNOSIS — F331 Major depressive disorder, recurrent, moderate: Secondary | ICD-10-CM

## 2023-05-04 ENCOUNTER — Telehealth: Payer: Self-pay | Admitting: Adult Health

## 2023-05-04 NOTE — Telephone Encounter (Signed)
Please call patient and follow up.

## 2023-05-04 NOTE — Telephone Encounter (Signed)
LVM to RC 

## 2023-05-04 NOTE — Telephone Encounter (Signed)
Please see message. She has an appt for Monday.

## 2023-05-04 NOTE — Telephone Encounter (Signed)
Mary [name] just walked into our door and was crying. She wanted to see Mary [name] this afternoon but she doesn't have any appts left. Mary [name] states she is having trouble with her daughter and is withholding her grand kids from her. She would like to be prescribed for her that will calm her nerves down. Phone number is [phone]. Pharmacy is:   [facility] - [place], [place] - [address]   Phone: [phone]  Fax: [phone]

## 2023-05-05 NOTE — Telephone Encounter (Signed)
Called patient again and she said she was okay. Said she had a "really bad meltdown" yesterday and left work and went home and slept.  She reports she is okay today, affect seems flat, but she was not tearful. Has an appt on 7/15.

## 2023-05-08 ENCOUNTER — Ambulatory Visit: Payer: No Typology Code available for payment source | Admitting: Adult Health

## 2023-05-08 ENCOUNTER — Encounter: Payer: Self-pay | Admitting: Gastroenterology

## 2023-05-08 ENCOUNTER — Ambulatory Visit (INDEPENDENT_AMBULATORY_CARE_PROVIDER_SITE_OTHER): Payer: No Typology Code available for payment source | Admitting: Gastroenterology

## 2023-05-08 VITALS — BP 122/78 | HR 68 | Ht 63.0 in | Wt 168.0 lb

## 2023-05-08 DIAGNOSIS — R109 Unspecified abdominal pain: Secondary | ICD-10-CM

## 2023-05-08 DIAGNOSIS — K5909 Other constipation: Secondary | ICD-10-CM

## 2023-05-08 NOTE — Progress Notes (Signed)
I agree with the assessment and plan as outlined by Ms. McMichael. 

## 2023-05-08 NOTE — Progress Notes (Signed)
Chief Complaint: Chronic Constipation Primary GI MD: Gentry Fitz  HPI: 57 year old female with history of hypothyroidism, fibromyalgia, anxiety, bipolar type II, presents for evaluation of chronic constipation  Lab work 01/16/2023 -Unrevealing CBC -CMP shows ALT 45/AST 18/alk phos 92 -TSH 0.071, thyroid medication was adjusted  Patient states she has struggled with chronic constipation.  Can go a few days without a bowel movement.  Sometimes will have bowel movements pellet-like.  Often has to strain with bowel movements.  Will sometimes have associated lower abdominal discomfort that improves after having a bowel movement.  Denies melena/hematochezia.  Reports previous colonoscopy within the last 5 years at [facility].  Family history of colon cancer in great grandmother on maternal side.  Has not tried anything over-the-counter for her constipation.   PREVIOUS GI WORKUP   Will obtain records  Past Medical History:  Diagnosis Date   Fibromyalgia    Migraine    Thyroid disease     Past Surgical History:  Procedure Laterality Date   TONSILLECTOMY     TUBAL LIGATION     uterine ablation  2004   irregular heavy periods.    Current Outpatient Medications  Medication Sig Dispense Refill   ARMOUR THYROID 90 MG tablet      Ascorbic Acid (VITAMIN C) 1000 MG tablet Vitamin C     Cholecalciferol (VITAMIN D3) 125 MCG (5000 UT) CAPS Take by mouth.     FLUoxetine (PROZAC) 20 MG capsule Take 1 capsule by mouth once daily 30 capsule 2   ibuprofen (ADVIL) 800 MG tablet Take 1 tablet (800 mg total) by mouth every 8 (eight) hours as needed. 90 tablet 1   lamoTRIgine (LAMICTAL) 100 MG tablet Take 1 tablet by mouth once daily 30 tablet 2   meloxicam (MOBIC) 15 MG tablet Take 1 tablet (15 mg total) by mouth daily. 30 tablet 3   nortriptyline (PAMELOR) 10 MG capsule Take 1 capsule (10 mg total) by mouth at bedtime. 30 capsule 3   valACYclovir (VALTREX) 500 MG tablet Take 500 mg by mouth 2 (two)  times daily.     No current facility-administered medications for this visit.    Allergies as of 05/08/2023 - Review Complete 05/08/2023  Allergen Reaction Noted   Amoxicillin  02/28/2022   Sulfa antibiotics Other (See Comments) 12/31/2021    Family History  Problem Relation Age of Onset   Heart disease Mother    Depression Mother    Heart disease Father    Depression Father    Depression Maternal Uncle    Colon cancer Other        great grandmother   Stomach cancer Neg Hx    Esophageal cancer Neg Hx    Pancreatic cancer Neg Hx     Social History   Socioeconomic History   Marital status: Married    Spouse name: Not on file   Number of children: Not on file   Years of education: Not on file   Highest education level: 12th grade  Occupational History   Not on file  Tobacco Use   Smoking status: Former    Types: Cigarettes   Smokeless tobacco: Never   Tobacco comments:    quit feb 2016  Vaping Use   Vaping status: Never Used  Substance and Sexual Activity   Alcohol use: No    Alcohol/week: 0.0 standard drinks of alcohol   Drug use: No   Sexual activity: Yes    Birth control/protection: Surgical  Other Topics Concern  Not on file  Social History Narrative   Married since 1998.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   Social Determinants of Health   Financial Resource Strain: Medium Risk (03/06/2023)   Overall Financial Resource Strain (CARDIA)    Difficulty of Paying Living Expenses: Somewhat hard  Food Insecurity: No Food Insecurity (03/06/2023)   Hunger Vital Sign    Worried About Running Out of Food in the Last Year: Never true    Ran Out of Food in the Last Year: Never true  Transportation Needs: No Transportation Needs (03/06/2023)   PRAPARE - Administrator, Civil Service (Medical): No    Lack of Transportation (Non-Medical): No  Physical Activity: Insufficiently Active (03/06/2023)   Exercise  Vital Sign    Days of Exercise per Week: 2 days    Minutes of Exercise per Session: 20 min  Stress: Stress Concern Present (03/06/2023)   [employer] of Occupational Health - Occupational Stress Questionnaire    Feeling of Stress : To some extent  Social Connections: Socially Integrated (03/06/2023)   Social Connection and Isolation Panel [NHANES]    Frequency of Communication with Friends and Family: More than three times a week    Frequency of Social Gatherings with Friends and Family: Twice a week    Attends Religious Services: More than 4 times per year    Active Member of [employer] or Organizations: Yes    Attends Banker Meetings: More than 4 times per year    Marital Status: Married  Catering manager Violence: Unknown (01/28/2022)   Received from [facility]   HITS    Physically Hurt: Not on file    Insult or Talk Down To: Not on file    Threaten Physical Harm: Not on file    Scream or Curse: Not on file    Review of Systems:    Constitutional: No weight loss, fever, chills, weakness or fatigue HEENT: Eyes: No change in vision               Ears, Nose, Throat:  No change in hearing or congestion Skin: No rash or itching Cardiovascular: No chest pain, chest pressure or palpitations   Respiratory: No SOB or cough Gastrointestinal: See HPI and otherwise negative Genitourinary: No dysuria or change in urinary frequency Neurological: No headache, dizziness or syncope Musculoskeletal: No new muscle or joint pain Hematologic: No bleeding or bruising Psychiatric: No history of depression or anxiety    Physical Exam:  Vital signs: Ht 5\' 3"  (1.6 m)   BMI 27.99 kg/m   Constitutional: NAD, Well developed, Well nourished, alert and cooperative Head:  Normocephalic and atraumatic. Eyes:   PEERL, EOMI. No icterus. Conjunctiva pink. Respiratory: Respirations even and unlabored. Lungs clear to auscultation bilaterally.   No wheezes, crackles, or rhonchi.   Cardiovascular:  Regular rate and rhythm. No peripheral edema, cyanosis or pallor.  Gastrointestinal:  Soft, nondistended, nontender. No rebound or guarding. Normal bowel sounds. No appreciable masses or hepatomegaly. Rectal:  Not performed.  Msk:  Symmetrical without gross deformities. Without edema, no deformity or joint abnormality.  Neurologic:  Alert and  oriented x4;  grossly normal neurologically.  Skin:   Dry and intact without significant lesions or rashes. Psychiatric: Oriented to person, place and time. Demonstrates good judgement and reason without abnormal affect or behaviors.   RELEVANT LABS AND IMAGING: CBC    Component Value Date/Time   WBC 6.6 01/16/2023 1055   RBC 4.46 01/16/2023 1055  HGB 14.2 01/16/2023 1055   HGB 14.4 01/10/2023 1652   HCT 42.0 01/16/2023 1055   HCT 41.9 01/10/2023 1652   PLT 282 01/16/2023 1055   PLT 350 01/10/2023 1652   MCV 94.2 01/16/2023 1055   MCV 94 01/10/2023 1652   MCH 31.8 01/16/2023 1055   MCHC 33.8 01/16/2023 1055   RDW 11.4 (L) 01/16/2023 1055   RDW 11.6 (L) 01/10/2023 1652   LYMPHSABS 1.3 01/16/2023 1055   LYMPHSABS 2.1 04/19/2018 0830   MONOABS 0.6 01/16/2023 1055   EOSABS 0.1 01/16/2023 1055   EOSABS 0.1 04/19/2018 0830   BASOSABS 0.0 01/16/2023 1055   BASOSABS 0.0 04/19/2018 0830    CMP     Component Value Date/Time   NA 134 (L) 01/16/2023 1055   NA 141 01/10/2023 1652   K 4.0 01/16/2023 1055   CL 100 01/16/2023 1055   CO2 23 01/16/2023 1055   GLUCOSE 112 (H) 01/16/2023 1055   BUN 9 01/16/2023 1055   BUN 17 01/10/2023 1652   CREATININE 0.82 01/16/2023 1055   CREATININE 0.68 01/27/2021 1750   CALCIUM 8.6 (L) 01/16/2023 1055   PROT 7.2 01/16/2023 1055   PROT 7.0 01/10/2023 1652   ALBUMIN 4.0 01/16/2023 1055   ALBUMIN 4.6 01/10/2023 1652   AST 18 01/16/2023 1055   ALT 45 (H) 01/16/2023 1055   ALKPHOS 92 01/16/2023 1055   BILITOT 0.6 01/16/2023 1055   BILITOT <0.2 01/10/2023 1652   GFRNONAA >60 01/16/2023  1055   GFRNONAA 100 01/27/2021 1750   GFRAA 116 01/27/2021 1750     Assessment/Plan:   Chronic constipation Abdominal discomfort Chronic constipation likely multifactorial especially with history of thyroid disease and antidepressants.  Up-to-date on colonoscopy.  Obtain records. -MiraLAX 1 capful daily and adjust dose as needed -Benefiber 1 tablespoon daily and adjust dose as needed -Increase water and increase exercise -Squatty potty -Provided patient education -IBgard as needed for pain -Follow-up 3 months  Alaisha Eversley Jolee [facility] 05/08/2023, 11:36 AM  Cc: Tommie Sams, DO

## 2023-05-08 NOTE — Patient Instructions (Addendum)
Start taking Miralax 1 capful (17 grams) 1x / day for 1 week.   If this is not effective, increase to 1 dose 2x / day for 1 week.   If this is still not effective, increase to two capfuls (34 grams) 2x / day.   Can adjust dose as needed based on response. Can take 1/2 cap daily, skip days, or increase per day.    We will try to obtain your records from [facility]  Take Benefiber 1 tablespoon daily  Use a squatty potty  Take over the counter IBgard as needed for abdominal discomfort  _______________________________________________________  If your blood pressure at your visit was 140/90 or greater, please contact your primary care physician to follow up on this.  _______________________________________________________  If you are age 65 or older, your body mass index should be between 23-30. Your Body mass index is 29.76 kg/m. If this is out of the aforementioned range listed, please consider follow up with your Primary Care Provider.  If you are age 81 or younger, your body mass index should be between 19-25. Your Body mass index is 29.76 kg/m. If this is out of the aformentioned range listed, please consider follow up with your Primary Care Provider.   ________________________________________________________  The [facility] providers would like to encourage you to use [facility] to communicate with providers for non-urgent requests or questions.  Due to long hold times on the telephone, sending your provider a message by [facility] may be a faster and more efficient way to get a response.  Please allow 48 business hours for a response.  Please remember that this is for non-urgent requests.  _______________________________________________________   I appreciate the  opportunity to care for you  Thank You   [facility]ley [facility]

## 2023-05-09 ENCOUNTER — Other Ambulatory Visit: Payer: Self-pay | Admitting: Family Medicine

## 2023-05-09 DIAGNOSIS — M797 Fibromyalgia: Secondary | ICD-10-CM

## 2023-07-11 ENCOUNTER — Other Ambulatory Visit: Payer: Self-pay | Admitting: Obstetrics and Gynecology

## 2023-07-11 ENCOUNTER — Encounter: Payer: Self-pay | Admitting: Adult Health

## 2023-07-11 ENCOUNTER — Ambulatory Visit (INDEPENDENT_AMBULATORY_CARE_PROVIDER_SITE_OTHER): Payer: No Typology Code available for payment source | Admitting: Adult Health

## 2023-07-11 DIAGNOSIS — R928 Other abnormal and inconclusive findings on diagnostic imaging of breast: Secondary | ICD-10-CM

## 2023-07-11 DIAGNOSIS — F331 Major depressive disorder, recurrent, moderate: Secondary | ICD-10-CM

## 2023-07-11 DIAGNOSIS — F411 Generalized anxiety disorder: Secondary | ICD-10-CM

## 2023-07-11 DIAGNOSIS — F4010 Social phobia, unspecified: Secondary | ICD-10-CM

## 2023-07-11 NOTE — Progress Notes (Signed)
OPHA RAMES [id number] May 16, 1966 57 y.o.  Subjective:   Patient ID:  Mary [name] is a 57 y.o. (DOB 1966-08-18) female.  Chief Complaint: No chief complaint on file.   HPI Mary [name] presents to the office today for follow-up of MDD, GAD and SAD.  Describes mood today as "ok". Pleasant. Denies tearfulness. Mood symptoms - denies depression. Reports anxiety and irritability. Denies panic attacks. Reports social anxiety. Reports feeling angry. Going through some things with daughter - does not like the way her husband treats her. Reports an incident in the work setting. Reports difficulties controlling what she says. Reports trying to calm herself down and not react to things that bother her. Stating "I'm having a difficult time controlling my anger". Mood is variable. Stating "I need to learn how to manage things that are not comfortable". Planning to see a therapist. Feels like current medication regimen works well. Family doing well. Stable interest and motivation. Taking medications as prescribed. Energy levels stable. Active, has started going to the gym. Enjoys some usual interests and activities. Married x 24 years. Lives with husband. Has 3 grown children. Mother local. Spending time with family. Appetite adequate. Weight loss.  Sleeping well most nights. Averages 8 hours. Difficulties with focus and concentration improved. Completing tasks. Managing aspects of household. Works full-time - [employer]. Denies SI or HI.  Denies AH or VH. Denies self harm. Denies substance use.  Previous medication trials: Wellbutrin XL, SSRI's, Cymbalta    GAD-7    Flowsheet Row Office Visit from 04/12/2023 in [facility] Office Visit from 03/06/2023 in [facility] Office Visit from 01/10/2023 in [facility] Office Visit from 12/18/2018 in [facility]  Total GAD-7 Score 4 10 2 9        PHQ2-9    Flowsheet Row Office Visit from 04/12/2023 in [facility] Office Visit from 03/06/2023 in [facility] Office Visit from 01/10/2023 in [facility] Office Visit from 04/07/2021 in [facility] Office Visit from 12/18/2018 in [facility]  PHQ-2 Total Score 0 4 0 0 3  PHQ-9 Total Score 2 12 1  0 14      Flowsheet Row ED from 02/18/2023 in [facility] at [facility] ED from 03/15/2021 in [facility] at [place]  C-SSRS RISK CATEGORY No Risk No Risk        Review of Systems:  Review of Systems  Musculoskeletal:  Negative for gait problem.  Neurological:  Negative for tremors.  Psychiatric/Behavioral:         Please refer to HPI    Medications: I have reviewed the patient's current medications.  Current Outpatient Medications  Medication Sig Dispense Refill   ARMOUR THYROID 90 MG tablet      Ascorbic Acid (VITAMIN C) 1000 MG tablet Vitamin C     Cholecalciferol (VITAMIN D3) 125 MCG (5000 UT) CAPS Take by mouth.     FLUoxetine (PROZAC) 20 MG capsule Take 1 capsule by mouth once daily 30 capsule 2   ibuprofen (ADVIL) 800 MG tablet Take 1 tablet (800 mg total) by mouth every 8 (eight) hours as needed. 90 tablet 1   lamoTRIgine (LAMICTAL) 100 MG tablet Take 1 tablet by mouth once daily 30 tablet 2   meloxicam (MOBIC) 15 MG tablet Take 1 tablet (15 mg total) by mouth daily. 30 tablet 3   nortriptyline (  PAMELOR) 10 MG capsule Take 1 capsule by mouth at bedtime 90 capsule 1   valACYclovir (VALTREX) 500 MG tablet Take 500 mg by mouth 2 (two) times daily.     No current facility-administered medications for this visit.    Medication Side Effects: None  Allergies:  Allergies  Allergen Reactions   Amoxicillin    Sulfa Antibiotics Other (See Comments)    Lip swelling    Past Medical History:  Diagnosis Date   Fibromyalgia     Migraine    Thyroid disease     Past Medical History, Surgical history, Social history, and Family history were reviewed and updated as appropriate.   Please see review of systems for further details on the patient's review from today.   Objective:   Physical Exam:  There were no vitals taken for this visit.  Physical Exam Constitutional:      General: She is not in acute distress. Musculoskeletal:        General: No deformity.  Neurological:     Mental Status: She is alert and oriented to person, place, and time.     Coordination: Coordination normal.  Psychiatric:        Attention and Perception: Attention and perception normal. She does not perceive auditory or visual hallucinations.        Mood and Affect: Affect is not labile, blunt, angry or inappropriate.        Speech: Speech normal.        Behavior: Behavior normal.        Thought Content: Thought content normal. Thought content is not paranoid or delusional. Thought content does not include homicidal or suicidal ideation. Thought content does not include homicidal or suicidal plan.        Cognition and Memory: Cognition and memory normal.        Judgment: Judgment normal.     Comments: Insight intact     Lab Review:     Component Value Date/Time   NA 134 (L) 01/16/2023 1055   NA 141 01/10/2023 1652   K 4.0 01/16/2023 1055   CL 100 01/16/2023 1055   CO2 23 01/16/2023 1055   GLUCOSE 112 (H) 01/16/2023 1055   BUN 9 01/16/2023 1055   BUN 17 01/10/2023 1652   CREATININE 0.82 01/16/2023 1055   CREATININE 0.68 01/27/2021 1750   CALCIUM 8.6 (L) 01/16/2023 1055   PROT 7.2 01/16/2023 1055   PROT 7.0 01/10/2023 1652   ALBUMIN 4.0 01/16/2023 1055   ALBUMIN 4.6 01/10/2023 1652   AST 18 01/16/2023 1055   ALT 45 (H) 01/16/2023 1055   ALKPHOS 92 01/16/2023 1055   BILITOT 0.6 01/16/2023 1055   BILITOT <0.2 01/10/2023 1652   GFRNONAA >60 01/16/2023 1055   GFRNONAA 100 01/27/2021 1750   GFRAA 116 01/27/2021 1750        Component Value Date/Time   WBC 6.6 01/16/2023 1055   RBC 4.46 01/16/2023 1055   HGB 14.2 01/16/2023 1055   HGB 14.4 01/10/2023 1652   HCT 42.0 01/16/2023 1055   HCT 41.9 01/10/2023 1652   PLT 282 01/16/2023 1055   PLT 350 01/10/2023 1652   MCV 94.2 01/16/2023 1055   MCV 94 01/10/2023 1652   MCH 31.8 01/16/2023 1055   MCHC 33.8 01/16/2023 1055   RDW 11.4 (L) 01/16/2023 1055   RDW 11.6 (L) 01/10/2023 1652   LYMPHSABS 1.3 01/16/2023 1055   LYMPHSABS 2.1 04/19/2018 0830   MONOABS 0.6 01/16/2023 1055   EOSABS 0.1  01/16/2023 1055   EOSABS 0.1 04/19/2018 0830   BASOSABS 0.0 01/16/2023 1055   BASOSABS 0.0 04/19/2018 0830    No results found for: "POCLITH", "LITHIUM"   No results found for: "PHENYTOIN", "PHENOBARB", "VALPROATE", "CBMZ"   .res Assessment: Plan:    Plan:  PDMP reviewed:  Lamictal 100mg  daily  Prozac 20mg  daily  Add Buspar 10mg  BID   RTC 6 months  Patient advised to contact office with any questions, adverse effects, or acute worsening in signs and symptoms.  Discussed potential metabolic side effects associated with atypical antipsychotics, as well as potential risk for movement side effects. Advised pt to contact office if movement side effects occur.    Counseled patient regarding potential benefits, risks, and side effects of Lamictal to include potential risk of Stevens-Johnson syndrome. Advised patient to stop taking Lamictal and contact office immediately if rash develops and to seek urgent medical attention if rash is severe and/or spreading quickly.    There are no diagnoses linked to this encounter.   Please see After Visit Summary for patient specific instructions.  Future Appointments  Date Time Provider Department Center  07/11/2023 12:00 PM Selma Mink, Thereasa Solo, NP CP-CP None  07/21/2023  3:10 PM GI[facility] DIAG [place] 2 GI-[facility]  07/21/2023  3:20 PM GI-[facility] [place] 2 GI-[facility] GI-[facility]  07/24/2023 10:00 AM Mathis Fare,  LCSW CP-CP None  08/24/2023  3:00 PM McMichael, Bayley M, PA-C [facility]    No orders of the defined types were placed in this encounter.   -------------------------------

## 2023-07-12 ENCOUNTER — Other Ambulatory Visit: Payer: Self-pay

## 2023-07-12 MED ORDER — LORAZEPAM 0.5 MG PO TABS: 0.5000 mg | ORAL_TABLET | Freq: Two times a day (BID) | ORAL | 0 refills | Status: DC | PRN

## 2023-07-12 MED ORDER — BUSPIRONE HCL 10 MG PO TABS: 10.0000 mg | ORAL_TABLET | Freq: Two times a day (BID) | ORAL | 1 refills | Status: DC

## 2023-07-18 ENCOUNTER — Ambulatory Visit: Payer: No Typology Code available for payment source | Admitting: Gastroenterology

## 2023-07-21 ENCOUNTER — Other Ambulatory Visit: Payer: No Typology Code available for payment source

## 2023-07-24 ENCOUNTER — Ambulatory Visit (INDEPENDENT_AMBULATORY_CARE_PROVIDER_SITE_OTHER): Payer: No Typology Code available for payment source | Admitting: Psychiatry

## 2023-07-24 DIAGNOSIS — F411 Generalized anxiety disorder: Secondary | ICD-10-CM
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
## 2023-07-30 ENCOUNTER — Other Ambulatory Visit: Payer: Self-pay | Admitting: Adult Health

## 2023-07-30 DIAGNOSIS — F331 Major depressive disorder, recurrent, moderate: Secondary | ICD-10-CM

## 2023-07-30 DIAGNOSIS — F4010 Social phobia, unspecified: Secondary | ICD-10-CM

## 2023-08-09 ENCOUNTER — Ambulatory Visit: Payer: No Typology Code available for payment source | Admitting: Psychiatry

## 2023-08-09 DIAGNOSIS — F331 Major depressive disorder, recurrent, moderate: Secondary | ICD-10-CM
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
## 2023-08-13 ENCOUNTER — Emergency Department (HOSPITAL_COMMUNITY)
Admission: EM | Admit: 2023-08-13 | Discharge: 2023-08-13 | Disposition: A | Discharge status: Home / Self Care | Payer: No Typology Code available for payment source | Attending: Emergency Medicine | Admitting: Emergency Medicine

## 2023-08-13 ENCOUNTER — Emergency Department (HOSPITAL_COMMUNITY): Payer: No Typology Code available for payment source

## 2023-08-13 ENCOUNTER — Other Ambulatory Visit: Payer: Self-pay

## 2023-08-13 ENCOUNTER — Encounter (HOSPITAL_COMMUNITY): Payer: Self-pay

## 2023-08-13 DIAGNOSIS — R079 Chest pain, unspecified: Secondary | ICD-10-CM | POA: Diagnosis present

## 2023-08-13 DIAGNOSIS — R0789 Other chest pain: Secondary | ICD-10-CM | POA: Diagnosis not present

## 2023-08-13 LAB — BASIC METABOLIC PANEL
Anion gap: 9 (ref 5–15)
BUN: 22 mg/dL — ABNORMAL HIGH (ref 6–20)
CO2: 24 mmol/L (ref 22–32)
Calcium: 8.5 mg/dL — ABNORMAL LOW (ref 8.9–10.3)
Chloride: 102 mmol/L (ref 98–111)
Creatinine, Ser: 0.82 mg/dL (ref 0.44–1.00)
GFR, Estimated: >60 mL/min (ref >60)
Glucose, Bld: 111 mg/dL — ABNORMAL HIGH (ref 70–99)
Potassium: 3.6 mmol/L (ref 3.5–5.1)
Sodium: 135 mmol/L (ref 135–145)

## 2023-08-13 LAB — CBC
HCT: 39.8 % (ref 36.0–46.0)
Hemoglobin: 13.4 g/dL (ref 12.0–15.0)
MCH: 31.6 pg (ref 26.0–34.0)
MCHC: 33.7 g/dL (ref 30.0–36.0)
MCV: 93.9 fL (ref 80.0–100.0)
Platelets: 332 10*3/uL (ref 150–400)
RBC: 4.24 MIL/uL (ref 3.87–5.11)
RDW: 11.9 % (ref 11.5–15.5)
WBC: 7.6 10*3/uL (ref 4.0–10.5)
nRBC: 0 % (ref 0.0–0.2)

## 2023-08-13 LAB — TROPONIN I (HIGH SENSITIVITY): Troponin I (High Sensitivity): <2 ng/L (ref <18)

## 2023-08-13 MED ORDER — NAPROXEN 500 MG PO TABS: 500.0000 mg | ORAL_TABLET | Freq: Two times a day (BID) | ORAL | 0 refills | Status: DC

## 2023-08-13 MED ORDER — NAPROXEN 250 MG PO TABS
500.0000 mg | ORAL_TABLET | Freq: Once | ORAL | Status: AC
  Administered 2023-08-13: 500 mg via ORAL
  Filled 2023-08-13: qty 2

## 2023-08-13 NOTE — ED Provider Notes (Signed)
[place] EMERGENCY DEPARTMENT AT [facility] Provider Note   CSN: [id number] Arrival date & time: 08/13/23  1708     History  Chief Complaint  Patient presents with   Chest Pain    Mary [name] is a 57 y.o. female.   Chest Pain Patient is a 57 year old female presenting to the hospital with a complaint of left-sided chest pain, this is the location where her lower ribs meet the sternum, it is just left of midline and started about a week ago after she had returned home from a beach vacation.  She states that has been constant 24 hours a day but is worse with trying to palpate that area and she denies a rash in that location.  It is not exertional nor is it associated with shortness of breath or fevers, she has no history of hypertension diabetes cholesterol or tobacco use and has never had any heart problems.  She was having some kind of palpitation symptoms back in 2016 during which time she underwent cardiac monitoring and an echocardiogram which was overall unremarkable.     Home Medications Prior to Admission medications   Medication Sig Start Date End Date Taking? Authorizing Provider  naproxen (NAPROSYN) 500 MG tablet Take 1 tablet (500 mg total) by mouth 2 (two) times daily with a meal. 08/13/23  Yes Eber Hong, MD  ARMOUR THYROID 90 MG tablet     [provider]  Ascorbic Acid (VITAMIN C) 1000 MG tablet Vitamin C    [provider]  busPIRone (BUSPAR) 10 MG tablet Take 1 tablet (10 mg total) by mouth 2 (two) times daily. 07/12/23   Mozingo, Thereasa Solo, NP  Cholecalciferol (VITAMIN D3) 125 MCG (5000 UT) CAPS Take by mouth.    [provider]  FLUoxetine (PROZAC) 20 MG capsule Take 1 capsule by mouth once daily 07/30/23   Mozingo, Thereasa Solo, NP  ibuprofen (ADVIL) 800 MG tablet Take 1 tablet (800 mg total) by mouth every 8 (eight) hours as needed. 02/20/23   Vickki Hearing, MD  lamoTRIgine (LAMICTAL) 100 MG tablet  Take 1 tablet by mouth once daily 07/30/23   Mozingo, Thereasa Solo, NP  LORazepam (ATIVAN) 0.5 MG tablet Take 1 tablet (0.5 mg total) by mouth 2 (two) times daily as needed for anxiety. 07/12/23   Mozingo, Thereasa Solo, NP  meloxicam (MOBIC) 15 MG tablet Take 1 tablet (15 mg total) by mouth daily. 01/10/23   Tommie Sams, DO  nortriptyline (PAMELOR) 10 MG capsule Take 1 capsule by mouth at bedtime 05/09/23   Tommie Sams, DO  valACYclovir (VALTREX) 500 MG tablet Take 500 mg by mouth 2 (two) times daily. 03/08/21   [provider]      Allergies    Amoxicillin and Sulfa antibiotics    Review of Systems   Review of Systems  Cardiovascular:  Positive for chest pain.  All other systems reviewed and are negative.   Physical Exam Updated Vital Signs BP 139/75   Pulse 80   Temp 98.9 F (37.2 C) (Oral)   Resp 12   Ht 1.6 m (5\' 3" )   Wt 72.6 kg   SpO2 99%   BMI 28.34 kg/m  Physical Exam Vitals and nursing note reviewed.  Constitutional:      General: She is not in acute distress.    Appearance: She is well-developed.  HENT:     Head: Normocephalic and atraumatic.     Mouth/Throat:  Pharynx: No oropharyngeal exudate.  Eyes:     General: No scleral icterus.       Right eye: No discharge.        Left eye: No discharge.     Conjunctiva/sclera: Conjunctivae normal.     Pupils: Pupils are equal, round, and reactive to light.  Neck:     Thyroid: No thyromegaly.     Vascular: No JVD.  Cardiovascular:     Rate and Rhythm: Normal rate and regular rhythm.     Heart sounds: Normal heart sounds. No murmur heard.    No friction rub. No gallop.  Pulmonary:     Effort: Pulmonary effort is normal. No respiratory distress.     Breath sounds: Normal breath sounds. No wheezing or rales.     Comments: Focal tenderness to palpation directly over the costochondral joint on the left parasternal area at the lower sternal border on the left.  There is no rash or masses located in  this area, there is no dermatomal rash wrapping around the left side of the chest. Chest:     Chest wall: Tenderness present.  Abdominal:     General: Bowel sounds are normal. There is no distension.     Palpations: Abdomen is soft. There is no mass.     Tenderness: There is no abdominal tenderness.  Musculoskeletal:        General: No tenderness. Normal range of motion.     Cervical back: Normal range of motion and neck supple.  Lymphadenopathy:     Cervical: No cervical adenopathy.  Skin:    General: Skin is warm and dry.     Findings: No erythema or rash.  Neurological:     Mental Status: She is alert.     Coordination: Coordination normal.  Psychiatric:        Behavior: Behavior normal.     ED Results / Procedures / Treatments   Labs (all labs ordered are listed, but only abnormal results are displayed) Labs Reviewed  BASIC METABOLIC PANEL - Abnormal; Notable for the following components:      Result Value   Glucose, Bld 111 (*)    BUN 22 (*)    Calcium 8.5 (*)    All other components within normal limits  CBC  TROPONIN I (HIGH SENSITIVITY)    EKG EKG Interpretation Date/Time:  Sunday August 13 2023 17:27:28 EDT Ventricular Rate:  97 PR Interval:  144 QRS Duration:  72 QT Interval:  344 QTC Calculation: 436 R Axis:   95  Text Interpretation: Sinus rhythm with frequent Premature ventricular complexes Rightward axis Borderline ECG When compared with ECG of 08-Sep-2019 19:57, Premature ventricular complexes are now Present Criteria for Septal infarct are no longer Present T wave inversion no longer evident in Inferior leads T wave inversion no longer evident in Anterolateral leads Confirmed by Eber Hong ([id number]) on 08/13/2023 5:42:24 PM  Radiology DG Chest 2 View  Result Date: 08/13/2023 CLINICAL DATA:  Left chest pain EXAM: CHEST - 2 VIEW COMPARISON:  01/16/2023 FINDINGS: The heart size and mediastinal contours are within normal limits. Both lungs are clear.  The visualized skeletal structures are unremarkable. IMPRESSION: No active cardiopulmonary disease. Electronically Signed   By: Helyn Numbers M.D.   On: 08/13/2023 20:29    Procedures Procedures    Medications Ordered in ED Medications  naproxen (NAPROSYN) tablet 500 mg (500 mg Oral Given 08/13/23 1856)    ED Course/ Medical Decision Making/ A&P  Medical Decision Making Amount and/or Complexity of Data Reviewed Labs: ordered. Radiology: ordered.  Risk Prescription drug management.    This patient presents to the ED for concern of chest discomfort, this involves an extensive number of treatment options, and is a complaint that carries with it a high risk of complications and morbidity.  The differential diagnosis includes chest wall pain, costochondritis, could be underlying bony mass or tumor, less likely be cardiac disease given negative troponin and 24 hours a day of symptoms for the last 7 days.  She has normal lung sounds, normal vital signs, no hypoxia, no fever, no rash   Co morbidities that complicate the patient evaluation  None   Additional history obtained:  Additional history obtained from medical record External records from outside source obtained and reviewed including prior echocardiogram   Lab Tests:  I Ordered, and personally interpreted labs.  The pertinent results include: CBC metabolic panel troponin all negative   Imaging Studies ordered:  I ordered imaging studies including x-ray, 2 view I independently visualized and interpreted imaging which showed no acute findings I agree with the radiologist interpretation   Cardiac Monitoring: / EKG:  The patient was maintained on a cardiac monitor.  I personally viewed and interpreted the cardiac monitored which showed an underlying rhythm of: Normal sinus rhythm, occasional PVC   Problem List / ED Course / Critical interventions / Medication management  Chest pain,  seems musculoskeletal, extremely reproducible both historically and on exam I ordered medication including Naprosyn for pain Reevaluation of the patient after these medicines showed that the patient no significant changes I have reviewed the patients home medicines and have made adjustments as needed   Social Determinants of Health:  None   Test / Admission - Considered:  Considered admission but the patient is very well-appearing for discharge and likely has chest wall pain, extremely low risk for cardiac disease         Final Clinical Impression(s) / ED Diagnoses Final diagnoses:  Chest wall pain    Rx / DC Orders ED Discharge Orders          Ordered    naproxen (NAPROSYN) 500 MG tablet  2 times daily with meals        08/13/23 2037              Eber Hong, MD 08/13/23 2038

## 2023-08-13 NOTE — ED Triage Notes (Signed)
C/o pain under left breast with shob x1 week. pain is worse when laying flat on back  Pt reports [facility].  Patient was prescribed naproxen w.o relief

## 2023-08-13 NOTE — ED Notes (Signed)
Patient transported to X-ray 

## 2023-08-13 NOTE — Discharge Instructions (Signed)
Thankfully all of your test have been normal including the x-ray and the blood work and the EKG.  I would like for you to take the medication called Naprosyn twice a day to help with the pain, your family doctor should check this out within the next week if it is not any better, they may need to refer you to a specialist if things are not going in the right direction.  ER for worsening symptoms

## 2023-08-14 ENCOUNTER — Encounter: Payer: Self-pay | Admitting: Family Medicine

## 2023-08-14 ENCOUNTER — Ambulatory Visit: Payer: No Typology Code available for payment source | Admitting: Family Medicine

## 2023-08-14 VITALS — BP 123/79 | HR 82 | Temp 97.7°F | Ht 63.0 in | Wt 164.2 lb

## 2023-08-14 DIAGNOSIS — M546 Pain in thoracic spine: Secondary | ICD-10-CM | POA: Insufficient documentation

## 2023-08-14 MED ORDER — BACLOFEN 10 MG PO TABS: 5.0000 mg | ORAL_TABLET | Freq: Three times a day (TID) | ORAL | 0 refills | Status: DC | PRN

## 2023-08-14 MED ORDER — HYDROCODONE-ACETAMINOPHEN 5-325 MG PO TABS: 1.0000 | ORAL_TABLET | Freq: Three times a day (TID) | ORAL | 0 refills | Status: DC | PRN

## 2023-08-14 NOTE — Assessment & Plan Note (Signed)
I feel that this is musculoskeletal in nature.  However, there could be a neuropathic component.  Imaging has been negative thus far.  Baclofen as directed.  Hydrocodone as needed for severe pain.  Will arrange MRI for further evaluation given persistent pain and lack of improvement.

## 2023-08-14 NOTE — Progress Notes (Signed)
Subjective:  Patient ID: Mary [name], female    DOB: [birth year]  Age: 57 y.o. MRN: [id number]  CC: Chest wall pain/back pain   HPI:  57 year old female presents for evaluation of the above.  2-week history of pain underneath the left breast extending around to the thoracic spine.  She was evaluated at an urgent care in [place] and has been seen in the ER.  ER visit was yesterday.  She has not improved with supportive care and over-the-counter treatment.  She is very concerned.  Patient states that the pain is quite severe.  Denies any rash, swelling, erythema.  Worse with activity.  No relieving factors.  Described as an achy.  Patient Active Problem List   Diagnosis Date Noted   Acute left-sided thoracic back pain 08/14/2023   Genital herpes 05/13/2020   Social anxiety disorder 02/26/2020   Bipolar 2 disorder (HCC) 01/09/2020   Anxiety 01/09/2020   Mixed hyperlipidemia 02/14/2019   Hypothyroidism 09/26/2018   Fibromyalgia affecting multiple sites 05/28/2014    Social Hx   Social History   Socioeconomic History   Marital status: Married    Spouse name: Not on file   Number of children: Not on file   Years of education: Not on file   Highest education level: 12th grade  Occupational History   Not on file  Tobacco Use   Smoking status: Former    Types: Cigarettes   Smokeless tobacco: Never   Tobacco comments:    quit feb 2016  Vaping Use   Vaping status: Never Used  Substance and Sexual Activity   Alcohol use: No    Alcohol/week: 0.0 standard drinks of alcohol   Drug use: No   Sexual activity: Yes    Birth control/protection: Surgical  Other Topics Concern   Not on file  Social History Narrative   Married since 1998.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   Social Determinants of Health   Financial Resource Strain: Medium Risk (03/06/2023)   Overall Financial Resource Strain (CARDIA)    Difficulty of  Paying Living Expenses: Somewhat hard  Food Insecurity: No Food Insecurity (03/06/2023)   Hunger Vital Sign    Worried About Running Out of Food in the Last Year: Never true    Ran Out of Food in the Last Year: Never true  Transportation Needs: No Transportation Needs (03/06/2023)   PRAPARE - Administrator, Civil Service (Medical): No    Lack of Transportation (Non-Medical): No  Physical Activity: Insufficiently Active (03/06/2023)   Exercise Vital Sign    Days of Exercise per Week: 2 days    Minutes of Exercise per Session: 20 min  Stress: Stress Concern Present (03/06/2023)   [employer] of Occupational Health - Occupational Stress Questionnaire    Feeling of Stress : To some extent  Social Connections: Socially Integrated (03/06/2023)   Social Connection and Isolation Panel [NHANES]    Frequency of Communication with Friends and Family: More than three times a week    Frequency of Social Gatherings with Friends and Family: Twice a week    Attends Religious Services: More than 4 times per year    Active Member of [employer] or Organizations: Yes    Attends Engineer, structural: More than 4 times per year    Marital Status: Married    Review of Systems Per HPI  Objective:  BP 123/79   Pulse 82   Temp 97.7 F (36.5 C)   Ht  5\' 3"  (1.6 m)   Wt 164 lb 3.2 oz (74.5 kg)   SpO2 100%   BMI 29.09 kg/m      08/14/2023    3:59 PM 08/13/2023    8:54 PM 08/13/2023    6:40 PM  BP/Weight  Systolic BP 123 136 139  Diastolic BP 79 72 75  Wt. (Lbs) 164.2  160  BMI 29.09 kg/m2  28.34 kg/m2    Physical Exam Vitals and nursing note reviewed.  Constitutional:      General: She is not in acute distress.    Appearance: Normal appearance.  HENT:     Head: Normocephalic and atraumatic.  Cardiovascular:     Rate and Rhythm: Normal rate and regular rhythm.  Chest:       Comments: Patient with exquisite tenderness at the labeled location and also tenderness  posteriorly at the thoracic spine.. Neurological:     Mental Status: She is alert.     Lab Results  Component Value Date   WBC 7.6 08/13/2023   HGB 13.4 08/13/2023   HCT 39.8 08/13/2023   PLT 332 08/13/2023   GLUCOSE 111 (H) 08/13/2023   CHOL 208 (H) 01/10/2023   TRIG 157 (H) 01/10/2023   HDL 47 01/10/2023   LDLCALC 133 (H) 01/10/2023   ALT 45 (H) 01/16/2023   AST 18 01/16/2023   NA 135 08/13/2023   K 3.6 08/13/2023   CL 102 08/13/2023   CREATININE 0.82 08/13/2023   BUN 22 (H) 08/13/2023   CO2 24 08/13/2023   TSH 0.071 (L) 01/10/2023   HGBA1C 5.4 01/27/2021     Assessment & Plan:   Problem List Items Addressed This Visit       Other   Acute left-sided thoracic back pain - Primary    I feel that this is musculoskeletal in nature.  However, there could be a neuropathic component.  Imaging has been negative thus far.  Baclofen as directed.  Hydrocodone as needed for severe pain.  Will arrange MRI for further evaluation given persistent pain and lack of improvement.      Relevant Medications   baclofen (LIORESAL) 10 MG tablet   HYDROcodone-acetaminophen (NORCO/VICODIN) 5-325 MG tablet   Other Relevant Orders   MR Thoracic Spine Wo Contrast    Meds ordered this encounter  Medications   baclofen (LIORESAL) 10 MG tablet    Sig: Take 0.5-1 tablets (5-10 mg total) by mouth 3 (three) times daily as needed for muscle spasms.    Dispense:  30 each    Refill:  0   HYDROcodone-acetaminophen (NORCO/VICODIN) 5-325 MG tablet    Sig: Take 1 tablet by mouth every 8 (eight) hours as needed for moderate pain (pain score 4-6) or severe pain (pain score 7-10).    Dispense:  15 tablet    Refill:  0   *[place] controlled substance database reviewed.  Follow-up:  Pending MRI  Everlene Other DO [facility]

## 2023-08-14 NOTE — Patient Instructions (Signed)
Medications as prescribed.  Working on MRI.  Take care  Dr. Adriana Simas

## 2023-08-15 ENCOUNTER — Ambulatory Visit (HOSPITAL_COMMUNITY)
Admission: RE | Admit: 2023-08-15 | Discharge: 2023-08-15 | Disposition: A | Discharge status: Home / Self Care | Payer: No Typology Code available for payment source | Source: Ambulatory Visit | Attending: Family Medicine | Admitting: Family Medicine

## 2023-08-15 DIAGNOSIS — M546 Pain in thoracic spine: Secondary | ICD-10-CM | POA: Diagnosis present

## 2023-08-16 ENCOUNTER — Telehealth: Payer: Self-pay | Admitting: *Deleted

## 2023-08-16 DIAGNOSIS — M546 Pain in thoracic spine: Secondary | ICD-10-CM

## 2023-08-16 NOTE — Telephone Encounter (Signed)
Per Dr Adriana Simas: Disc protrusion. No evidence of impingement on a nerve. Nothing worrisome

## 2023-08-16 NOTE — Telephone Encounter (Signed)
Patient notified and urgent referral ordered in EPIC

## 2023-08-16 NOTE — Telephone Encounter (Signed)
Patient notified of results and patient would like to know what that means for her pain and what she should do now- what is next steps

## 2023-08-16 NOTE — Telephone Encounter (Signed)
Everlene Other G, DO     This could be the source of her pain but the MRI did not show any nerve impingement. Recommend referral to [facility].

## 2023-08-16 NOTE — Telephone Encounter (Signed)
Patient would like to know results of MRI.

## 2023-08-21 ENCOUNTER — Ambulatory Visit: Payer: No Typology Code available for payment source | Admitting: Psychiatry

## 2023-08-22 ENCOUNTER — Ambulatory Visit
Admission: RE | Admit: 2023-08-22 | Discharge: 2023-08-22 | Disposition: A | Discharge status: Home / Self Care | Payer: No Typology Code available for payment source | Source: Ambulatory Visit | Attending: Obstetrics and Gynecology | Admitting: Obstetrics and Gynecology

## 2023-08-22 DIAGNOSIS — R928 Other abnormal and inconclusive findings on diagnostic imaging of breast: Secondary | ICD-10-CM

## 2023-08-24 ENCOUNTER — Ambulatory Visit: Payer: No Typology Code available for payment source | Admitting: Gastroenterology

## 2023-08-28 ENCOUNTER — Ambulatory Visit (INDEPENDENT_AMBULATORY_CARE_PROVIDER_SITE_OTHER): Payer: No Typology Code available for payment source | Admitting: Psychiatry

## 2023-08-28 DIAGNOSIS — F331 Major depressive disorder, recurrent, moderate: Secondary | ICD-10-CM | POA: Diagnosis not present
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
## 2023-08-30 ENCOUNTER — Encounter: Payer: Self-pay | Admitting: Nurse Practitioner

## 2023-08-30 ENCOUNTER — Ambulatory Visit: Payer: No Typology Code available for payment source | Admitting: Nurse Practitioner

## 2023-08-30 VITALS — BP 105/70 | Temp 97.9°F | Ht 62.99 in | Wt 165.2 lb

## 2023-08-30 DIAGNOSIS — I493 Ventricular premature depolarization: Secondary | ICD-10-CM | POA: Diagnosis not present

## 2023-08-30 DIAGNOSIS — Z0000 Encounter for general adult medical examination without abnormal findings: Secondary | ICD-10-CM

## 2023-08-30 DIAGNOSIS — I499 Cardiac arrhythmia, unspecified: Secondary | ICD-10-CM

## 2023-08-30 DIAGNOSIS — Z0001 Encounter for general adult medical examination with abnormal findings: Secondary | ICD-10-CM | POA: Diagnosis not present
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
## 2023-08-31 DIAGNOSIS — I499 Cardiac arrhythmia, unspecified: Secondary | ICD-10-CM | POA: Insufficient documentation

## 2023-08-31 DIAGNOSIS — I493 Ventricular premature depolarization: Secondary | ICD-10-CM | POA: Insufficient documentation

## 2023-08-31 HISTORY — DX: Cardiac arrhythmia, unspecified: I49.9

## 2023-09-04 ENCOUNTER — Ambulatory Visit: Payer: No Typology Code available for payment source | Admitting: Psychiatry

## 2023-09-06 ENCOUNTER — Other Ambulatory Visit: Payer: Self-pay | Admitting: Adult Health

## 2023-09-06 NOTE — Telephone Encounter (Signed)
LF 09/18; LV 09/17; NV 3/17

## 2023-09-07 ENCOUNTER — Ambulatory Visit: Payer: No Typology Code available for payment source | Attending: Cardiology

## 2023-09-07 ENCOUNTER — Ambulatory Visit: Payer: No Typology Code available for payment source | Attending: Cardiology | Admitting: Cardiology

## 2023-09-07 ENCOUNTER — Other Ambulatory Visit: Payer: Self-pay | Admitting: Cardiology

## 2023-09-07 ENCOUNTER — Encounter: Payer: Self-pay | Admitting: Cardiology

## 2023-09-07 VITALS — BP 138/70 | HR 52 | Ht 63.0 in | Wt 166.8 lb

## 2023-09-07 DIAGNOSIS — R002 Palpitations: Secondary | ICD-10-CM | POA: Diagnosis not present

## 2023-09-07 DIAGNOSIS — I493 Ventricular premature depolarization: Secondary | ICD-10-CM

## 2023-09-07 NOTE — Progress Notes (Signed)
Clinical Summary Mary [name] is a 57 y.o.female seen today as a new patient. Last seen by Dr Purvis Sheffield in 2016.   1.Palpitations - seen in 2016 by Dr Gordy Levan. A monitor was ordered by not completed - 2016 echo: LVEF 55-60%, no WMAs, grade I dd. PVCS noted during study  - can feeling skipping of heart daily, off and on all day - no set trigger for symptoms - no specific chset pains. Mild dizziness. Can feel fatigue - coffee 1-2 cups daily, no sodas, no tea, no energy drinks, no EtOH  - has had some recent issues with hyperthyroidism. Recent decrease thyroid medication by pcp      Father MI 38  Past Medical History:  Diagnosis Date   Fibromyalgia    Migraine    Thyroid disease      Allergies  Allergen Reactions   Amoxicillin Hives and Other (See Comments)   Sulfa Antibiotics Other (See Comments), Hives and Rash    Lip swelling     Current Outpatient Medications  Medication Sig Dispense Refill   ARMOUR THYROID 90 MG tablet      Ascorbic Acid (VITAMIN C) 1000 MG tablet Vitamin C     baclofen (LIORESAL) 10 MG tablet Take 0.5-1 tablets (5-10 mg total) by mouth 3 (three) times daily as needed for muscle spasms. 30 each 0   busPIRone (BUSPAR) 10 MG tablet Take 1 tablet (10 mg total) by mouth 2 (two) times daily. 60 tablet 1   Cholecalciferol (VITAMIN D3) 125 MCG (5000 UT) CAPS Take by mouth.     FLUoxetine (PROZAC) 20 MG capsule Take 1 capsule by mouth once daily 90 capsule 0   HYDROcodone-acetaminophen (NORCO/VICODIN) 5-325 MG tablet Take 1 tablet by mouth every 8 (eight) hours as needed for moderate pain (pain score 4-6) or severe pain (pain score 7-10). 15 tablet 0   lamoTRIgine (LAMICTAL) 100 MG tablet Take 1 tablet by mouth once daily 90 tablet 0   LORazepam (ATIVAN) 0.5 MG tablet TAKE ONE TABLET BY MOUTH TWICE DAILY AS NEEDED 60 tablet 2   naproxen (NAPROSYN) 500 MG tablet Take 1 tablet (500 mg total) by mouth 2 (two) times daily with a meal. 30 tablet 0    nortriptyline (PAMELOR) 10 MG capsule Take 1 capsule by mouth at bedtime 90 capsule 1   valACYclovir (VALTREX) 500 MG tablet Take 500 mg by mouth 2 (two) times daily.     No current facility-administered medications for this visit.     Past Surgical History:  Procedure Laterality Date   TONSILLECTOMY     TUBAL LIGATION     uterine ablation  2004   irregular heavy periods.     Allergies  Allergen Reactions   Amoxicillin Hives and Other (See Comments)   Sulfa Antibiotics Other (See Comments), Hives and Rash    Lip swelling      Family History  Problem Relation Age of Onset   Heart disease Mother    Depression Mother    Heart disease Father    Depression Father    Depression Maternal Uncle    Colon cancer Other        great grandmother   Stomach cancer Neg Hx    Esophageal cancer Neg Hx    Pancreatic cancer Neg Hx      Social History Ms. Klostermann reports that she has quit smoking. Her smoking use included cigarettes. She has never used smokeless tobacco. Ms. Livingston reports no history of alcohol  use.   Review of Systems CONSTITUTIONAL: No weight loss, fever, chills, weakness or fatigue.  HEENT: Eyes: No visual loss, blurred vision, double vision or yellow sclerae.No hearing loss, sneezing, congestion, runny nose or sore throat.  SKIN: No rash or itching.  CARDIOVASCULAR: per hpi RESPIRATORY: No shortness of breath, cough or sputum.  GASTROINTESTINAL: No anorexia, nausea, vomiting or diarrhea. No abdominal pain or blood.  GENITOURINARY: No burning on urination, no polyuria NEUROLOGICAL: No headache, dizziness, syncope, paralysis, ataxia, numbness or tingling in the extremities. No change in bowel or bladder control.  MUSCULOSKELETAL: No muscle, back pain, joint pain or stiffness.  LYMPHATICS: No enlarged nodes. No history of splenectomy.  PSYCHIATRIC: No history of depression or anxiety.  ENDOCRINOLOGIC: No reports of sweating, cold or heat intolerance. No  polyuria or polydipsia.  Marland Kitchen   Physical Examination Today's Vitals   09/07/23 0842  BP: 138/70  Pulse: (!) 52  SpO2: 95%  Weight: 166 lb 12.8 oz (75.7 kg)  Height: 5\' 3"  (1.6 m)   Body mass index is 29.55 kg/m.;  Gen: resting comfortably, no acute distress HEENT: no scleral icterus, pupils equal round and reactive, no palptable cervical adenopathy,  CV: RRR, no m/rg, no jvd Resp: Clear to auscultation bilaterally GI: abdomen is soft, non-tender, non-distended, normal bowel sounds, no hepatosplenomegaly MSK: extremities are warm, no edema.  Skin: warm, no rash Neuro:  no focal deficits Psych: appropriate affect     Assessment and Plan  1.Palpitations - noted PVCs on prior EKG likely the etiology of her symptoms - plan for 3 day zio patch to assess PVC burden and any significant arrhythmias - pending PVC burden may warrant echo, stress testing - of note recent issues with high thyroid, pcp lowered dosing of her synthroid with repeat labs pending in December. May also be playing a role, however seen by PVCs in 12/20216 as well and TSH was normal at that time.          Antoine Poche, M.D.

## 2023-09-07 NOTE — Patient Instructions (Addendum)
Medication Instructions:  Your physician recommends that you continue on your current medications as directed. Please refer to the Current Medication list given to you today.  Labwork: none  Testing/Procedures: 3 Day ZIO XT  Follow-Up: Your physician recommends that you schedule a follow-up appointment in: 6 weeks  Any Other Special Instructions Will Be Listed Below (If Applicable).  If you need a refill on your cardiac medications before your next appointment, please call your pharmacy.

## 2023-09-08 ENCOUNTER — Encounter: Payer: Self-pay | Admitting: Cardiology

## 2023-09-11 ENCOUNTER — Other Ambulatory Visit: Payer: Self-pay | Admitting: Adult Health

## 2023-09-11 DIAGNOSIS — F4010 Social phobia, unspecified: Secondary | ICD-10-CM

## 2023-09-20 ENCOUNTER — Ambulatory Visit: Payer: No Typology Code available for payment source | Admitting: Psychiatry

## 2023-09-26 ENCOUNTER — Other Ambulatory Visit: Payer: Self-pay | Admitting: Adult Health

## 2023-09-28 ENCOUNTER — Telehealth: Payer: Self-pay | Admitting: Cardiology

## 2023-09-28 NOTE — Telephone Encounter (Signed)
Pt states this morning her jaws, neck, and head hurt. She states she is still having palpitations. She denies any CP or SOB. She states she is overall just not feeling well. She asked if she could move her appt up. I did not see anything in [place] or [place] prior to 12/27. Please advise what she can do in the meantime.

## 2023-09-28 NOTE — Telephone Encounter (Signed)
Spoke to patient who stated that she has had a dull ache in the back of her neck radiating to her jaw and head since this morning. Pt also c/o fatigue and general un-wellness. Pt stated she has not been sick lately. Pt advised to contact PCP for recommendations.  Pt notified there were no available appointments at this time in [place] office at this time, but offered [place] appointment with Lionel December, NP but pt declined appointment.  Please advise.

## 2023-09-29 ENCOUNTER — Telehealth: Payer: Self-pay | Admitting: Cardiology

## 2023-09-29 NOTE — Telephone Encounter (Signed)
Patient returned staff call. 

## 2023-09-29 NOTE — Telephone Encounter (Signed)
Rescheduled pt to see Dr. Wyline Mood on 10/02/2023 in [place] for palpitations/fluttering feeling

## 2023-10-02 ENCOUNTER — Ambulatory Visit: Payer: No Typology Code available for payment source | Attending: Cardiology | Admitting: Cardiology

## 2023-10-02 ENCOUNTER — Encounter: Payer: Self-pay | Admitting: Cardiology

## 2023-10-02 VITALS — BP 122/82 | HR 80 | Ht 63.0 in | Wt 160.0 lb

## 2023-10-02 DIAGNOSIS — R002 Palpitations: Secondary | ICD-10-CM

## 2023-10-02 DIAGNOSIS — I493 Ventricular premature depolarization: Secondary | ICD-10-CM | POA: Diagnosis not present

## 2023-10-02 MED ORDER — METOPROLOL TARTRATE 25 MG PO TABS: 12.5000 mg | ORAL_TABLET | Freq: Two times a day (BID) | ORAL | 6 refills | Status: DC

## 2023-10-02 NOTE — Progress Notes (Signed)
Clinical Summary Ms. Rhyne is a 57 y.o.female seen today for follow up of the following medical problems   1.Palpitations - seen in 2016 by Dr Gordy Levan. A monitor was ordered by not completed - 2016 echo: LVEF 55-60%, no WMAs, grade I dd. PVCS noted during study   - can feeling skipping of heart daily, off and on all day - no set trigger for symptoms - no specific chset pains. Mild dizziness. Can feel fatigue - coffee 1-2 cups daily, no sodas, no tea, no energy drinks, no EtOH   - has had some recent issues with hyperthyroidism. Recent decrease thyroid medication by pcp    08/2023 monitor: rare PACs, frequent PVCs 15.7%. Symptosm correlated with sinus rhythm and PVCs  -some ongoing symptoms. Can occur throughout the day, can last up to 30 minutes      2022 coronary calcium score was 0  Father MI 38 Past Medical History:  Diagnosis Date   Fibromyalgia    Migraine    Thyroid disease      Allergies  Allergen Reactions   Amoxicillin Hives and Other (See Comments)   Sulfa Antibiotics Other (See Comments), Hives and Rash    Lip swelling     Current Outpatient Medications  Medication Sig Dispense Refill   ARMOUR THYROID 90 MG tablet  (Patient not taking: Reported on 09/07/2023)     Ascorbic Acid (VITAMIN C) 1000 MG tablet Vitamin C     baclofen (LIORESAL) 10 MG tablet Take 0.5-1 tablets (5-10 mg total) by mouth 3 (three) times daily as needed for muscle spasms. 30 each 0   busPIRone (BUSPAR) 10 MG tablet TAKE ONE TABLET BY MOUTH TWICE DAILY 60 tablet 2   Cholecalciferol (VITAMIN D3) 125 MCG (5000 UT) CAPS Take by mouth.     FLUoxetine (PROZAC) 20 MG capsule Take 1 capsule by mouth once daily 90 capsule 0   HYDROcodone-acetaminophen (NORCO/VICODIN) 5-325 MG tablet Take 1 tablet by mouth every 8 (eight) hours as needed for moderate pain (pain score 4-6) or severe pain (pain score 7-10). 15 tablet 0   lamoTRIgine (LAMICTAL) 100 MG tablet Take 1 tablet by mouth  once daily 90 tablet 0   LORazepam (ATIVAN) 0.5 MG tablet TAKE ONE TABLET BY MOUTH TWICE DAILY AS NEEDED 60 tablet 2   naproxen (NAPROSYN) 500 MG tablet Take 1 tablet (500 mg total) by mouth 2 (two) times daily with a meal. 30 tablet 0   nortriptyline (PAMELOR) 10 MG capsule Take 1 capsule by mouth at bedtime 90 capsule 1   thyroid (ARMOUR) 60 MG tablet Take 60 mg by mouth daily before breakfast.     valACYclovir (VALTREX) 500 MG tablet Take 500 mg by mouth 2 (two) times daily.     No current facility-administered medications for this visit.     Past Surgical History:  Procedure Laterality Date   TONSILLECTOMY     TUBAL LIGATION     uterine ablation  2004   irregular heavy periods.     Allergies  Allergen Reactions   Amoxicillin Hives and Other (See Comments)   Sulfa Antibiotics Other (See Comments), Hives and Rash    Lip swelling      Family History  Problem Relation Age of Onset   Heart disease Mother    Depression Mother    Heart disease Father    Depression Father    Depression Maternal Uncle    Colon cancer Other  great grandmother   Stomach cancer Neg Hx    Esophageal cancer Neg Hx    Pancreatic cancer Neg Hx      Social History Ms. Ugalde reports that she has quit smoking. Her smoking use included cigarettes. She has never used smokeless tobacco. Ms. Freyre reports no history of alcohol use.   Review of Systems CONSTITUTIONAL: No weight loss, fever, chills, weakness or fatigue.  HEENT: Eyes: No visual loss, blurred vision, double vision or yellow sclerae.No hearing loss, sneezing, congestion, runny nose or sore throat.  SKIN: No rash or itching.  CARDIOVASCULAR: per hpi RESPIRATORY: No shortness of breath, cough or sputum.  GASTROINTESTINAL: No anorexia, nausea, vomiting or diarrhea. No abdominal pain or blood.  GENITOURINARY: No burning on urination, no polyuria NEUROLOGICAL: No headache, dizziness, syncope, paralysis, ataxia, numbness or  tingling in the extremities. No change in bowel or bladder control.  MUSCULOSKELETAL: No muscle, back pain, joint pain or stiffness.  LYMPHATICS: No enlarged nodes. No history of splenectomy.  PSYCHIATRIC: No history of depression or anxiety.  ENDOCRINOLOGIC: No reports of sweating, cold or heat intolerance. No polyuria or polydipsia.  Marland Kitchen   Physical Examination Today's Vitals   10/02/23 1026  BP: 122/82  Pulse: 80  SpO2: 98%  Weight: 160 lb (72.6 kg)  Height: 5\' 3"  (1.6 m)   Body mass index is 28.34 kg/m.  Gen: resting comfortably, no acute distress HEENT: no scleral icterus, pupils equal round and reactive, no palptable cervical adenopathy,  CV: RRR, no m/rg, no jvd Resp: Clear to auscultation bilaterally GI: abdomen is soft, non-tender, non-distended, normal bowel sounds, no hepatosplenomegaly MSK: extremities are warm, no edema.  Skin: warm, no rash Neuro:  no focal deficits Psych: appropriate affect      Assessment and Plan  1.Palpitations/PVCs - frequent PVCs on recent monitor, will start lopressor 12.5mg  bid for symptoms - obtain echo to evaluate for any structural heart disease. Pending findings plan for exercise nuclear stress to evaluate for any ischemia driven PVCs - f/u upcoming thyroid tests with pcp, her synthroid dose recently lowered due to high levels.  - EKG today shows NSR  F/u 3 months      Antoine Poche, M.D.

## 2023-10-02 NOTE — Patient Instructions (Signed)
Medication Instructions:   Begin Lopressor 12.5mg  twice a day  Continue all other medications.     Labwork:  none  Testing/Procedures:  Your physician has requested that you have an echocardiogram. Echocardiography is a painless test that uses sound waves to create images of your heart. It provides your doctor with information about the size and shape of your heart and how well your heart's chambers and valves are working. This procedure takes approximately one hour. There are no restrictions for this procedure. Please do NOT wear cologne, perfume, aftershave, or lotions (deodorant is allowed). Please arrive 15 minutes prior to your appointment time.  Please note: We ask at that you not bring children with you during ultrasound (echo/ vascular) testing. Due to room size and safety concerns, children are not allowed in the ultrasound rooms during exams. Our front office staff cannot provide observation of children in our lobby area while testing is being conducted. An adult accompanying a patient to their appointment will only be allowed in the ultrasound room at the discretion of the ultrasound technician under special circumstances. We apologize for any inconvenience. Office will contact with results via phone, letter or mychart.     Follow-Up:  3 months   Any Other Special Instructions Will Be Listed Below (If Applicable).   If you need a refill on your cardiac medications before your next appointment, please call your pharmacy.

## 2023-10-03 ENCOUNTER — Ambulatory Visit: Payer: No Typology Code available for payment source | Attending: Cardiology

## 2023-10-03 DIAGNOSIS — I493 Ventricular premature depolarization: Secondary | ICD-10-CM | POA: Diagnosis not present

## 2023-10-04 ENCOUNTER — Ambulatory Visit: Payer: No Typology Code available for payment source | Admitting: Psychiatry

## 2023-10-04 LAB — ECHOCARDIOGRAM COMPLETE
AR max vel: 2.61 cm2
AV Area VTI: 3 cm2
AV Area mean vel: 2.86 cm2
AV Mean grad: 3 mm[Hg]
AV Peak grad: 6.4 mm[Hg]
Ao pk vel: 1.26 m/s
Area-P 1/2: 5.23 cm2
Calc EF: 64.5 %
MV VTI: 4.31 cm2
S' Lateral: 2.8 cm
Single Plane A2C EF: 55.1 %
Single Plane A4C EF: 74.3 %

## 2023-10-12 ENCOUNTER — Ambulatory Visit: Payer: No Typology Code available for payment source | Admitting: Psychiatry

## 2023-10-12 DIAGNOSIS — F331 Major depressive disorder, recurrent, moderate: Secondary | ICD-10-CM

## 2023-10-12 NOTE — Progress Notes (Signed)
[facility] Counselor/Therapist Progress Note  Patient ID: Mary [name], MRN: [id number],    Date: 10/12/2023  Time Spent: 53 minutes   Treatment Type: Individual Therapy  Reported Symptoms: Anxiety, depression, reactive/over-reactive and lashes out, easy to feel rejected by others, "can get really emotional at times," tearfulness, stressed, impulsive, "but I am trying to do better especially in the situation with my daughter".   Mental Status Exam:  Appearance:   Casual     Behavior:  Appropriate, Sharing, and Motivated  Motor:  Normal  Speech/Language:   Clear and Coherent  Affect:  anxious  Mood:  anxious and depressed  Thought process:  goal directed  Thought content:    WNL  Sensory/Perceptual disturbances:    WNL  Orientation:  oriented to person, place, time/date, situation, day of week, month of year, year, and stated date of Dec. 19, 2024  Attention:  Good  Concentration:  Good and Fair  Memory:  WNL  Fund of knowledge:   Good  Insight:    Good and Fair  Judgment:   Good  Impulse Control:  Good   Risk Assessment: Danger to Self:  No Self-injurious Behavior: No Danger to Others: No Duty to Warn:no Physical Aggression / Violence:No  Access to Firearms a concern: No  Gang Involvement:No   Subjective:  Patient today back to pick up on some work she has started earlier.  Needing to work today on more family relationship issues, her needing to say no at times especially with her adult children, anxiety, depression, over reactive and lashes out at others mostly the family, "being mouthy", impulsive at times, unintentionally hurtful towards others and family.  Also trying to better manage anger and frustration.  Shares of her growing up feeling very "discard-a-ble".  Often will take things the wrong way and carry it in a negative direction emotionally.  Focus more on some of her communication issues today.  Said in last session that she was starting to learn  some ways of better relating as an adult contrary to the feelings she had as she was growing up.  Has been told by other adults that "you do not know how to communicate well".  Talked further today about the communication in her marriage being hurtful and we looked at ways she might speak and respond more calm and effectively.  Patient seems to agree in part and states that is part of the reason she came to therapy but has difficulty making commitments and keeping them, and also difficulty trusting herself to make changes, set appropriate limits and boundaries, and maintain her changes.  Does see some progress she is making.    Interventions: Cognitive Behavioral Therapy and Ego-Supportive  1.  Elevate mood and show evidence of usual energy, activities, and socialization level. 2.  Verbally express understanding of the relationship between depressed mood and repression of feelings such as anger, hurt, and sadness. 3.  Identify cognitive self talk that tends to support depression and work on changing the self talk to be more supportive and encouraging for patient.   Diagnosis:   ICD-10-CM   1. Major depressive disorder, recurrent episode, moderate (HCC)  F33.1      Plan:  Patient today in for appointment and continues working on her difficulty feeling rejected by others, anxiety, stress, lashing out, being "mouthy", and feeling "less than", often overly emotional, and sometimes impulsive in her responses and behaviors.  Having difficulty remaining committed to working on treatment goals and  we talked about that today.  Had stated earlier that she was starting to feel more comfortable working with her goal-directed behaviors and sensing some progress.  Patient states that she desires to build on that progress and will resume working with her goal-directed behaviors to move in a more hopeful and healthier direction.  Goal review and progress/challenges noted with patient.  Next appointment within 2  weeks.   Mathis Fare, LCSW

## 2023-10-13 ENCOUNTER — Telehealth: Payer: Self-pay

## 2023-10-13 DIAGNOSIS — I493 Ventricular premature depolarization: Secondary | ICD-10-CM

## 2023-10-13 NOTE — Telephone Encounter (Signed)
-----   Message from Dina Rich sent at 10/13/2023 12:13 PM EST ----- Monitor shows she has PVCs (extra heart beats) from the bottom of the heart about 16% of the time, which is considered a moderate amount. As we discussed previously these can sometimes can occur in an otherwise healthy heart but also can sometimes be a clue of other underlying heart issues. Her echo was fine. We need to do a test to look for any blockages in the arteries of the heart, please order an exercise nuclear stress test for PVCs. Verify can run on treadmill. Hold lopressor day of test.   Dominga Ferry MD

## 2023-10-13 NOTE — Telephone Encounter (Signed)
-----   Message from Dina Rich sent at 10/13/2023 12:11 PM EST ----- Echo shows normal heart pumping function. Seh has some very mild stiffness of the heart muscle which is a common and minor finding. Heart valves are working well. Overall no worrisome findings on echo.  Dominga Ferry MD

## 2023-10-13 NOTE — Telephone Encounter (Signed)
Patient notified and verbalized understanding of results. Patient had no questions or concerns at this time. Pt agreeable with exercise nuclear stress test.

## 2023-10-16 ENCOUNTER — Ambulatory Visit: Payer: No Typology Code available for payment source | Admitting: Psychiatry

## 2023-10-20 ENCOUNTER — Ambulatory Visit: Payer: No Typology Code available for payment source | Admitting: Nurse Practitioner

## 2023-10-23 ENCOUNTER — Ambulatory Visit (HOSPITAL_COMMUNITY)
Admission: RE | Admit: 2023-10-23 | Discharge: 2023-10-23 | Disposition: A | Discharge status: Home / Self Care | Payer: No Typology Code available for payment source | Source: Ambulatory Visit | Attending: Cardiology | Admitting: Cardiology

## 2023-10-23 ENCOUNTER — Ambulatory Visit (HOSPITAL_BASED_OUTPATIENT_CLINIC_OR_DEPARTMENT_OTHER)
Admission: RE | Admit: 2023-10-23 | Discharge: 2023-10-23 | Disposition: A | Discharge status: Home / Self Care | Payer: No Typology Code available for payment source | Source: Ambulatory Visit | Attending: Cardiology | Admitting: Cardiology

## 2023-10-23 DIAGNOSIS — I493 Ventricular premature depolarization: Secondary | ICD-10-CM

## 2023-10-23 LAB — NM MYOCAR MULTI W/SPECT W/WALL MOTION / EF
Angina Index: 0
Duke Treadmill Score: 7
Estimated workload: 8.6
Exercise duration (min): 6 min
Exercise duration (sec): 31 s
LV dias vol: 56 mL (ref 46–106)
LV sys vol: 25 mL
MPHR: 164 {beats}/min
Nuc Stress EF: 56 %
Peak HR: 146 {beats}/min
Percent HR: 89 %
RATE: 0.3
RPE: 15
Rest HR: 85 {beats}/min
Rest Nuclear Isotope Dose: 10.6 mCi
SDS: 1
SRS: 4
SSS: 5
ST Depression (mm): 0 mm
Stress Nuclear Isotope Dose: 31.4 mCi
TID: 0.78

## 2023-10-23 MED ORDER — TECHNETIUM TC 99M TETROFOSMIN IV KIT
31.4000 | PACK | Freq: Once | INTRAVENOUS | Status: AC | PRN
  Administered 2023-10-23: 31.4 via INTRAVENOUS

## 2023-10-23 MED ORDER — TECHNETIUM TC 99M TETROFOSMIN IV KIT
10.6000 | PACK | Freq: Once | INTRAVENOUS | Status: AC | PRN
Start: 2023-10-23 — End: 2023-10-23
  Administered 2023-10-23: 10.6 via INTRAVENOUS

## 2023-10-23 MED ORDER — REGADENOSON 0.4 MG/5ML IV SOLN
INTRAVENOUS | Status: AC
  Filled 2023-10-23: qty 5

## 2023-10-23 MED ORDER — SODIUM CHLORIDE FLUSH 0.9 % IV SOLN
INTRAVENOUS | Status: AC
  Administered 2023-10-23: 10 mL via INTRAVENOUS
  Filled 2023-10-23: qty 10

## 2023-10-26 ENCOUNTER — Other Ambulatory Visit: Payer: Self-pay | Admitting: Adult Health

## 2023-10-26 DIAGNOSIS — F331 Major depressive disorder, recurrent, moderate: Secondary | ICD-10-CM

## 2023-10-28 ENCOUNTER — Other Ambulatory Visit: Payer: Self-pay | Admitting: Family Medicine

## 2023-10-28 DIAGNOSIS — M797 Fibromyalgia: Secondary | ICD-10-CM

## 2023-10-31 ENCOUNTER — Ambulatory Visit: Payer: No Typology Code available for payment source | Admitting: Psychiatry

## 2023-11-03 ENCOUNTER — Encounter (HOSPITAL_COMMUNITY): Payer: No Typology Code available for payment source

## 2023-11-03 ENCOUNTER — Other Ambulatory Visit (HOSPITAL_COMMUNITY): Payer: No Typology Code available for payment source

## 2023-11-03 ENCOUNTER — Encounter: Payer: Self-pay | Admitting: Family Medicine

## 2023-11-10 ENCOUNTER — Encounter: Payer: Self-pay | Admitting: *Deleted

## 2023-12-05 ENCOUNTER — Ambulatory Visit: Payer: No Typology Code available for payment source | Admitting: Family Medicine

## 2024-01-02 ENCOUNTER — Ambulatory Visit: Payer: No Typology Code available for payment source | Admitting: Student

## 2024-01-08 ENCOUNTER — Ambulatory Visit: Payer: No Typology Code available for payment source | Admitting: Adult Health

## 2024-01-08 ENCOUNTER — Encounter: Payer: Self-pay | Admitting: Adult Health

## 2024-01-08 DIAGNOSIS — F411 Generalized anxiety disorder: Secondary | ICD-10-CM

## 2024-01-08 DIAGNOSIS — F331 Major depressive disorder, recurrent, moderate: Secondary | ICD-10-CM | POA: Diagnosis not present

## 2024-01-08 DIAGNOSIS — F4010 Social phobia, unspecified: Secondary | ICD-10-CM

## 2024-01-08 MED ORDER — BUSPIRONE HCL 10 MG PO TABS: 10.0000 mg | ORAL_TABLET | Freq: Two times a day (BID) | ORAL | 1 refills | Status: DC

## 2024-01-08 MED ORDER — LORAZEPAM 0.5 MG PO TABS: 0.5000 mg | ORAL_TABLET | Freq: Two times a day (BID) | ORAL | 2 refills | Status: DC | PRN

## 2024-01-08 MED ORDER — LAMOTRIGINE 100 MG PO TABS: 100.0000 mg | ORAL_TABLET | Freq: Every day | ORAL | 1 refills | Status: DC

## 2024-01-08 MED ORDER — FLUOXETINE HCL 20 MG PO CAPS: 20.0000 mg | ORAL_CAPSULE | Freq: Every day | ORAL | 1 refills | Status: DC

## 2024-01-08 NOTE — Progress Notes (Signed)
 Mary [name] [id number] [birth year] 58 y.o.  Subjective:   Patient ID:  Mary [name] is a 58 y.o. (DOB 1966/10/12) female.  Chief Complaint: No chief complaint on file.   HPI Mary [name] presents to the office today for follow-up of MDD, GAD and SAD.  Describes mood today as "ok". Pleasant. Denies tearfulness. Mood symptoms - denies depression. Report stable interest and motivation. Reports anxiety and irritability - "it comes and goes". Denies recent panic attacks. Reports some social anxiety. Denies feelings of angry. Reports she is able to control what she says better. Reports being less reactive to external situations. Mood has improved. Stating "I feel like I'm doing better". Feels like current medication regimen works well. Taking medications as prescribed. Energy levels stable. Active, exercising - walking on the treadmill daily.   Enjoys some usual interests and activities. Married x 24 years. Lives with husband. Has 3 grown children. Mother local. Spending time with family. Appetite adequate. Weight stable.  Sleeping well most nights. Averages 8 hours. Difficulties with focus and concentration stable. Completing tasks. Managing aspects of household. Works full-time - [employer]. Denies SI or HI.  Denies AH or VH. Denies self harm. Denies substance use.  Previous medication trials: Wellbutrin XL, SSRI's, Cymbalta   GAD-7    Flowsheet Row Office Visit from 08/30/2023 in [facility] Office Visit from 08/14/2023 in [facility] Office Visit from 04/12/2023 in [facility] Office Visit from 03/06/2023 in [facility] Office Visit from 01/10/2023 in [facility]  Total GAD-7 Score 3 0 4 10 2       PHQ2-9    Flowsheet Row Office Visit from 08/30/2023 in [facility] Office Visit from 08/14/2023 in [facility] Office Visit from 04/12/2023 in [facility] Office Visit from 03/06/2023 in [facility] Office Visit from 01/10/2023 in [facility]  PHQ-2 Total Score 0 0 0 4 0  PHQ-9 Total Score 2 1 2 12 1       Flowsheet Row ED from 08/13/2023 in [facility] Emergency Department at [facility] ED from 02/18/2023 in [facility] Emergency Department at [facility] ED from 03/15/2021 in [facility] at [place]  C-SSRS RISK CATEGORY No Risk No Risk No Risk        Review of Systems:  Review of Systems  Musculoskeletal:  Negative for gait problem.  Neurological:  Negative for tremors.  Psychiatric/Behavioral:         Please refer to HPI    Medications: I have reviewed the patient's current medications.  Current Outpatient Medications  Medication Sig Dispense Refill   busPIRone (BUSPAR) 10 MG tablet Take 1 tablet (10 mg total) by mouth 2 (two) times daily. 180 tablet 1   FLUoxetine (PROZAC) 20 MG capsule Take 1 capsule (20 mg total) by mouth daily. 90 capsule 1   lamoTRIgine (LAMICTAL) 100 MG tablet Take 1 tablet (100 mg total) by mouth daily. 90 tablet 1   LORazepam (ATIVAN) 0.5 MG tablet Take 1 tablet (0.5 mg total) by mouth 2 (two) times daily as needed. 60 tablet 2   metoprolol tartrate (LOPRESSOR) 25 MG tablet Take 0.5 tablets (12.5 mg total) by mouth 2 (two) times daily. 30 tablet 6   naproxen (NAPROSYN) 500 MG tablet Take 1 tablet (500 mg total) by mouth 2 (two) times  daily with a meal. 30 tablet 0   nortriptyline (PAMELOR) 10 MG capsule Take 1 capsule by mouth at bedtime 90 capsule 0   testosterone cypionate (DEPOTESTOSTERONE CYPIONATE) 200 MG/ML injection Inject 200 mg into the muscle once a week.     thyroid (ARMOUR) 60 MG tablet Take 60 mg by mouth daily before breakfast.     valACYclovir (VALTREX) 500 MG tablet Take 500 mg by mouth 2 (two) times daily.      No current facility-administered medications for this visit.    Medication Side Effects: None  Allergies:  Allergies  Allergen Reactions   Amoxicillin Hives and Other (See Comments)   Sulfa Antibiotics Other (See Comments), Hives and Rash    Lip swelling    Past Medical History:  Diagnosis Date   Fibromyalgia    Migraine    Thyroid disease     Past Medical History, Surgical history, Social history, and Family history were reviewed and updated as appropriate.   Please see review of systems for further details on the patient's review from today.   Objective:   Physical Exam:  There were no vitals taken for this visit.  Physical Exam Constitutional:      General: She is not in acute distress. Musculoskeletal:        General: No deformity.  Neurological:     Mental Status: She is alert and oriented to person, place, and time.     Coordination: Coordination normal.  Psychiatric:        Attention and Perception: Attention and perception normal. She does not perceive auditory or visual hallucinations.        Mood and Affect: Mood normal. Mood is not anxious or depressed. Affect is not labile, blunt, angry or inappropriate.        Speech: Speech normal.        Behavior: Behavior normal.        Thought Content: Thought content normal. Thought content is not paranoid or delusional. Thought content does not include homicidal or suicidal ideation. Thought content does not include homicidal or suicidal plan.        Cognition and Memory: Cognition and memory normal.        Judgment: Judgment normal.     Comments: Insight intact     Lab Review:     Component Value Date/Time   NA 135 08/13/2023 1743   NA 141 01/10/2023 1652   K 3.6 08/13/2023 1743   CL 102 08/13/2023 1743   CO2 24 08/13/2023 1743   GLUCOSE 111 (H) 08/13/2023 1743   BUN 22 (H) 08/13/2023 1743   BUN 17 01/10/2023 1652   CREATININE 0.82 08/13/2023 1743   CREATININE 0.68 01/27/2021 1750   CALCIUM 8.5 (L)  08/13/2023 1743   PROT 7.2 01/16/2023 1055   PROT 7.0 01/10/2023 1652   ALBUMIN 4.0 01/16/2023 1055   ALBUMIN 4.6 01/10/2023 1652   AST 18 01/16/2023 1055   ALT 45 (H) 01/16/2023 1055   ALKPHOS 92 01/16/2023 1055   BILITOT 0.6 01/16/2023 1055   BILITOT <0.2 01/10/2023 1652   GFRNONAA >60 08/13/2023 1743   GFRNONAA 100 01/27/2021 1750   GFRAA 116 01/27/2021 1750       Component Value Date/Time   WBC 7.6 08/13/2023 1743   RBC 4.24 08/13/2023 1743   HGB 13.4 08/13/2023 1743   HGB 14.4 01/10/2023 1652   HCT 39.8 08/13/2023 1743   HCT 41.9 01/10/2023 1652   PLT 332 08/13/2023 1743   PLT 350  01/10/2023 1652   MCV 93.9 08/13/2023 1743   MCV 94 01/10/2023 1652   MCH 31.6 08/13/2023 1743   MCHC 33.7 08/13/2023 1743   RDW 11.9 08/13/2023 1743   RDW 11.6 (L) 01/10/2023 1652   LYMPHSABS 1.3 01/16/2023 1055   LYMPHSABS 2.1 04/19/2018 0830   MONOABS 0.6 01/16/2023 1055   EOSABS 0.1 01/16/2023 1055   EOSABS 0.1 04/19/2018 0830   BASOSABS 0.0 01/16/2023 1055   BASOSABS 0.0 04/19/2018 0830    No results found for: "POCLITH", "LITHIUM"   No results found for: "PHENYTOIN", "PHENOBARB", "VALPROATE", "CBMZ"   .res Assessment: Plan:    Plan:  PDMP reviewed:  Lamictal 100mg  daily Prozac 20mg  daily Buspar 10mg  BID Lorazepam    RTC 6 months  Patient advised to contact office with any questions, adverse effects, or acute worsening in signs and symptoms.  Discussed potential metabolic side effects associated with atypical antipsychotics, as well as potential risk for movement side effects. Advised pt to contact office if movement side effects occur.    Counseled patient regarding potential benefits, risks, and side effects of Lamictal to include potential risk of Stevens-Johnson syndrome. Advised patient to stop taking Lamictal and contact office immediately if rash develops and to seek urgent medical attention if rash is severe and/or spreading quickly.   Diagnoses and all  orders for this visit:  Generalized anxiety disorder  Social anxiety disorder -     busPIRone (BUSPAR) 10 MG tablet; Take 1 tablet (10 mg total) by mouth 2 (two) times daily. -     FLUoxetine (PROZAC) 20 MG capsule; Take 1 capsule (20 mg total) by mouth daily. -     lamoTRIgine (LAMICTAL) 100 MG tablet; Take 1 tablet (100 mg total) by mouth daily. -     LORazepam (ATIVAN) 0.5 MG tablet; Take 1 tablet (0.5 mg total) by mouth 2 (two) times daily as needed.  Major depressive disorder, recurrent episode, moderate (HCC) -     busPIRone (BUSPAR) 10 MG tablet; Take 1 tablet (10 mg total) by mouth 2 (two) times daily. -     FLUoxetine (PROZAC) 20 MG capsule; Take 1 capsule (20 mg total) by mouth daily. -     lamoTRIgine (LAMICTAL) 100 MG tablet; Take 1 tablet (100 mg total) by mouth daily.     Please see After Visit Summary for patient specific instructions.  No future appointments.  No orders of the defined types were placed in this encounter.   -------------------------------

## 2024-01-27 ENCOUNTER — Other Ambulatory Visit: Payer: Self-pay | Admitting: Adult Health

## 2024-01-27 ENCOUNTER — Other Ambulatory Visit: Payer: Self-pay | Admitting: Family Medicine

## 2024-01-27 DIAGNOSIS — M797 Fibromyalgia: Secondary | ICD-10-CM

## 2024-01-27 DIAGNOSIS — F331 Major depressive disorder, recurrent, moderate: Secondary | ICD-10-CM

## 2024-01-27 DIAGNOSIS — F4010 Social phobia, unspecified: Secondary | ICD-10-CM

## 2024-01-29 ENCOUNTER — Other Ambulatory Visit: Payer: Self-pay

## 2024-03-15 ENCOUNTER — Ambulatory Visit: Payer: Self-pay | Admitting: *Deleted

## 2024-03-15 NOTE — Telephone Encounter (Signed)
  Chief Complaint: cough, fever, sore throat. Requesting antibiotics Symptoms: cough with thick brownish- green mucus, runny nose blowing for green mucus.scratchy dry throat. Body aches, headache, sore in mouth but now resolved or gone.  Fever 99.1 this am. Nausea. Co worker was sick earlier in week. Hx fibromyalgia  Frequency: Wednesday  Pertinent Negatives: Patient denies chest pain no difficulty breathing no dizziness reported. No vomiting. Disposition: [] ED /[] Urgent Care (no appt availability in office) / [] Appointment(In office/virtual)/ []  [facility]/ [] Home Care/ [x] Refused Recommended Disposition /[] [facility]/ []  Follow-up with PCP Additional Notes:    No available appt with PCP or other providers today . Patient requesting if PCP will prescribed antibiotics due to sx and hx . Recommended VV and patient reports she would rather see PCP due to her hx. Fibromyalgia. Please advise if VV can be scheduled today . Recommended UC and patient declined at this time.  Please advise. Patient requesting call back before 3 p if possible .       Reason for Disposition  Coughing up rusty-colored (reddish-brown) sputum  Answer Assessment - Initial Assessment Questions 1. ONSET: "When did the cough begin?"      Wednesday  2. SEVERITY: "How bad is the cough today?"      Not that bad 3. SPUTUM: "Describe the color of your sputum" (none, dry cough; clear, white, yellow, green)     Brownish green  esp when waking  4. HEMOPTYSIS: "Are you coughing up any blood?" If so ask: "How much?" (flecks, streaks, tablespoons, etc.)     Na  5. DIFFICULTY BREATHING: "Are you having difficulty breathing?" If Yes, ask: "How bad is it?" (e.g., mild, moderate, severe)    - MILD: No SOB at rest, mild SOB with walking, speaks normally in sentences, can lie down, no retractions, pulse < 100.    - MODERATE: SOB at rest, SOB with minimal exertion and prefers to sit, cannot lie down flat, speaks  in phrases, mild retractions, audible wheezing, pulse 100-120.    - SEVERE: Very SOB at rest, speaks in single words, struggling to breathe, sitting hunched forward, retractions, pulse > 120      na 6. FEVER: "Do you have a fever?" If Yes, ask: "What is your temperature, how was it measured, and when did it start?"     99.1 7. CARDIAC HISTORY: "Do you have any history of heart disease?" (e.g., heart attack, congestive heart failure)      Na  8. LUNG HISTORY: "Do you have any history of lung disease?"  (e.g., pulmonary embolus, asthma, emphysema)     Na  9. PE RISK FACTORS: "Do you have a history of blood clots?" (or: recent major surgery, recent prolonged travel, bedridden)     na 10. OTHER SYMPTOMS: "Do you have any other symptoms?" (e.g., runny nose, wheezing, chest pain)       Runny nose cough thick brownish green mucus , headache , body aches fever 99.1, scratchy throat. Did have sore in mouth like 'pimple" and now gone  60. PREGNANCY: "Is there any chance you are pregnant?" "When was your last menstrual period?"       na 12. TRAVEL: "Have you traveled out of the country in the last month?" (e.g., travel history, exposures)       na  Protocols used: Cough - Acute Productive-A-AH

## 2024-03-24 ENCOUNTER — Emergency Department (HOSPITAL_COMMUNITY)

## 2024-03-24 ENCOUNTER — Observation Stay (HOSPITAL_COMMUNITY)
Admission: EM | Admit: 2024-03-24 | Discharge: 2024-03-26 | Disposition: A | Discharge status: Home / Self Care | Attending: Internal Medicine | Admitting: Internal Medicine

## 2024-03-24 ENCOUNTER — Other Ambulatory Visit: Payer: Self-pay

## 2024-03-24 ENCOUNTER — Encounter (HOSPITAL_COMMUNITY): Payer: Self-pay

## 2024-03-24 DIAGNOSIS — I10 Essential (primary) hypertension: Secondary | ICD-10-CM | POA: Diagnosis not present

## 2024-03-24 DIAGNOSIS — K8012 Calculus of gallbladder with acute and chronic cholecystitis without obstruction: Secondary | ICD-10-CM | POA: Diagnosis not present

## 2024-03-24 DIAGNOSIS — R1011 Right upper quadrant pain: Secondary | ICD-10-CM | POA: Diagnosis present

## 2024-03-24 DIAGNOSIS — Z79899 Other long term (current) drug therapy: Secondary | ICD-10-CM | POA: Diagnosis not present

## 2024-03-24 DIAGNOSIS — E669 Obesity, unspecified: Secondary | ICD-10-CM | POA: Insufficient documentation

## 2024-03-24 DIAGNOSIS — Z87891 Personal history of nicotine dependence: Secondary | ICD-10-CM | POA: Diagnosis not present

## 2024-03-24 DIAGNOSIS — K851 Biliary acute pancreatitis without necrosis or infection: Secondary | ICD-10-CM | POA: Diagnosis not present

## 2024-03-24 DIAGNOSIS — K859 Acute pancreatitis without necrosis or infection, unspecified: Principal | ICD-10-CM | POA: Diagnosis present

## 2024-03-24 DIAGNOSIS — F3181 Bipolar II disorder: Secondary | ICD-10-CM | POA: Diagnosis not present

## 2024-03-24 DIAGNOSIS — M797 Fibromyalgia: Secondary | ICD-10-CM | POA: Diagnosis present

## 2024-03-24 DIAGNOSIS — E039 Hypothyroidism, unspecified: Secondary | ICD-10-CM | POA: Diagnosis not present

## 2024-03-24 DIAGNOSIS — K8020 Calculus of gallbladder without cholecystitis without obstruction: Secondary | ICD-10-CM

## 2024-03-24 DIAGNOSIS — K8000 Calculus of gallbladder with acute cholecystitis without obstruction: Secondary | ICD-10-CM

## 2024-03-24 LAB — HEPATIC FUNCTION PANEL
ALT: 38 U/L (ref 0–44)
AST: 33 U/L (ref 15–41)
Albumin: 3.8 g/dL (ref 3.5–5.0)
Alkaline Phosphatase: 73 U/L (ref 38–126)
Bilirubin, Direct: 0.2 mg/dL (ref 0.0–0.2)
Indirect Bilirubin: 0.7 mg/dL (ref 0.3–0.9)
Total Bilirubin: 0.9 mg/dL (ref 0.0–1.2)
Total Protein: 7 g/dL (ref 6.5–8.1)

## 2024-03-24 LAB — D-DIMER, QUANTITATIVE: D-Dimer, Quant: <0.27 ug{FEU}/mL (ref 0.00–0.50)

## 2024-03-24 LAB — BASIC METABOLIC PANEL WITH GFR
Anion gap: 8 (ref 5–15)
BUN: 16 mg/dL (ref 6–20)
CO2: 25 mmol/L (ref 22–32)
Calcium: 8.9 mg/dL (ref 8.9–10.3)
Chloride: 106 mmol/L (ref 98–111)
Creatinine, Ser: 0.82 mg/dL (ref 0.44–1.00)
GFR, Estimated: >60 mL/min (ref >60)
Glucose, Bld: 93 mg/dL (ref 70–99)
Potassium: 4 mmol/L (ref 3.5–5.1)
Sodium: 139 mmol/L (ref 135–145)

## 2024-03-24 LAB — CBC
HCT: 40.3 % (ref 36.0–46.0)
Hemoglobin: 13.8 g/dL (ref 12.0–15.0)
MCH: 31.6 pg (ref 26.0–34.0)
MCHC: 34.2 g/dL (ref 30.0–36.0)
MCV: 92.2 fL (ref 80.0–100.0)
Platelets: 285 10*3/uL (ref 150–400)
RBC: 4.37 MIL/uL (ref 3.87–5.11)
RDW: 11.6 % (ref 11.5–15.5)
WBC: 9.7 10*3/uL (ref 4.0–10.5)
nRBC: 0 % (ref 0.0–0.2)

## 2024-03-24 LAB — TROPONIN I (HIGH SENSITIVITY)
Troponin I (High Sensitivity): 3 ng/L (ref <18)
Troponin I (High Sensitivity): 3 ng/L (ref <18)

## 2024-03-24 LAB — LIPASE, BLOOD: Lipase: 270 U/L — ABNORMAL HIGH (ref 11–51)

## 2024-03-24 MED ORDER — PROGESTERONE 200 MG PO CAPS
200.0000 mg | ORAL_CAPSULE | Freq: Every day | ORAL | Status: DC
  Administered 2024-03-25: 200 mg via ORAL
  Filled 2024-03-24 (×3): qty 1

## 2024-03-24 MED ORDER — LACTATED RINGERS IV BOLUS
1000.0000 mL | Freq: Once | INTRAVENOUS | Status: AC
  Administered 2024-03-24: 1000 mL via INTRAVENOUS

## 2024-03-24 MED ORDER — MORPHINE SULFATE (PF) 4 MG/ML IV SOLN
4.0000 mg | INTRAVENOUS | Status: DC | PRN
  Administered 2024-03-24 – 2024-03-26 (×6): 4 mg via INTRAVENOUS
  Filled 2024-03-24 (×6): qty 1

## 2024-03-24 MED ORDER — LACTATED RINGERS IV SOLN: INTRAVENOUS | Status: AC

## 2024-03-24 MED ORDER — NORTRIPTYLINE HCL 10 MG PO CAPS
10.0000 mg | ORAL_CAPSULE | Freq: Every day | ORAL | Status: DC
  Administered 2024-03-24 – 2024-03-25 (×2): 10 mg via ORAL
  Filled 2024-03-24 (×4): qty 1

## 2024-03-24 MED ORDER — IOHEXOL 300 MG/ML  SOLN
100.0000 mL | Freq: Once | INTRAMUSCULAR | Status: AC | PRN
  Administered 2024-03-24: 100 mL via INTRAVENOUS

## 2024-03-24 MED ORDER — FLUOXETINE HCL 20 MG PO CAPS
20.0000 mg | ORAL_CAPSULE | Freq: Every day | ORAL | Status: DC
  Administered 2024-03-24 – 2024-03-25 (×2): 20 mg via ORAL
  Filled 2024-03-24 (×2): qty 1

## 2024-03-24 MED ORDER — ONDANSETRON HCL 4 MG/2ML IJ SOLN
4.0000 mg | Freq: Once | INTRAMUSCULAR | Status: AC
  Administered 2024-03-24: 4 mg via INTRAVENOUS
  Filled 2024-03-24: qty 2

## 2024-03-24 MED ORDER — THYROID 30 MG PO TABS
60.0000 mg | ORAL_TABLET | Freq: Every day | ORAL | Status: DC
  Administered 2024-03-25: 60 mg via ORAL
  Filled 2024-03-24: qty 2

## 2024-03-24 MED ORDER — SODIUM CHLORIDE 0.9 % IV SOLN
1.0000 g | Freq: Once | INTRAVENOUS | Status: AC
  Administered 2024-03-24: 1 g via INTRAVENOUS
  Filled 2024-03-24: qty 10

## 2024-03-24 MED ORDER — ONDANSETRON HCL 4 MG PO TABS: 4.0000 mg | ORAL_TABLET | Freq: Four times a day (QID) | ORAL | Status: DC | PRN

## 2024-03-24 MED ORDER — BUSPIRONE HCL 5 MG PO TABS
10.0000 mg | ORAL_TABLET | Freq: Two times a day (BID) | ORAL | Status: DC
  Administered 2024-03-24 – 2024-03-26 (×4): 10 mg via ORAL
  Filled 2024-03-24 (×4): qty 2

## 2024-03-24 MED ORDER — POLYETHYLENE GLYCOL 3350 17 G PO PACK: 17.0000 g | PACK | Freq: Every day | ORAL | Status: DC | PRN

## 2024-03-24 MED ORDER — ONDANSETRON HCL 4 MG/2ML IJ SOLN: 4.0000 mg | Freq: Four times a day (QID) | INTRAMUSCULAR | Status: DC | PRN

## 2024-03-24 MED ORDER — MORPHINE SULFATE (PF) 4 MG/ML IV SOLN
2.0000 mg | Freq: Once | INTRAVENOUS | Status: AC
  Administered 2024-03-24: 2 mg via INTRAVENOUS
  Filled 2024-03-24: qty 1

## 2024-03-24 MED ORDER — LAMOTRIGINE 100 MG PO TABS
100.0000 mg | ORAL_TABLET | Freq: Every day | ORAL | Status: DC
  Administered 2024-03-24 – 2024-03-25 (×2): 100 mg via ORAL
  Filled 2024-03-24 (×2): qty 1

## 2024-03-24 MED ORDER — METRONIDAZOLE 500 MG/100ML IV SOLN
500.0000 mg | Freq: Once | INTRAVENOUS | Status: AC
  Administered 2024-03-24: 500 mg via INTRAVENOUS
  Filled 2024-03-24: qty 100

## 2024-03-24 MED ORDER — THYROID 30 MG PO TABS
15.0000 mg | ORAL_TABLET | Freq: Every day | ORAL | Status: DC
  Administered 2024-03-25: 15 mg via ORAL

## 2024-03-24 MED ORDER — MORPHINE SULFATE (PF) 4 MG/ML IV SOLN
4.0000 mg | Freq: Once | INTRAVENOUS | Status: AC
  Administered 2024-03-24: 4 mg via INTRAVENOUS
  Filled 2024-03-24: qty 1

## 2024-03-24 NOTE — Plan of Care (Signed)
   Problem: Activity: Goal: Risk for activity intolerance will decrease Outcome: Progressing   Problem: Coping: Goal: Level of anxiety will decrease Outcome: Progressing   Problem: Safety: Goal: Ability to remain free from injury will improve Outcome: Progressing

## 2024-03-24 NOTE — ED Provider Notes (Signed)
 [place] EMERGENCY DEPARTMENT AT [facility] Provider Note   CSN: [id number] Arrival date & time: 03/24/24  1219     History  Chief Complaint  Patient presents with   Chest Pain    Mary [name] is a 58 y.o. female.  Patient is a 58 year old female who presents to the emergency department the chief complaint of right-sided chest pain and right upper quadrant abdominal pain which began this morning shortly after eating.  Patient notes that she did have associated nausea without vomiting.  She notes that she did have a bowel movement shortly after onset of pain but it did not improve her symptoms.  Patient denies any history of similar symptoms in the past.  She denies any personal history of cardiac or pulmonary disease but notes that she does have a family history of CAD.  She denies any recent falls or blunt abdominal wall or chest trauma.  There is been no associated dizziness, lightheadedness, syncope.   Chest Pain Associated symptoms: abdominal pain and nausea        Home Medications Prior to Admission medications   Medication Sig Start Date End Date Taking? Authorizing Provider  busPIRone  (BUSPAR ) 10 MG tablet Take 1 tablet (10 mg total) by mouth 2 (two) times daily. 01/08/24   Mozingo, Regina Nattalie, NP  FLUoxetine  (PROZAC ) 20 MG capsule Take 1 capsule by mouth once daily 01/27/24   Mozingo, Regina Nattalie, NP  lamoTRIgine  (LAMICTAL ) 100 MG tablet Take 1 tablet by mouth once daily 01/27/24   Mozingo, Regina Nattalie, NP  LORazepam  (ATIVAN ) 0.5 MG tablet Take 1 tablet (0.5 mg total) by mouth 2 (two) times daily as needed. 01/08/24   Mozingo, Regina Nattalie, NP  metoprolol  tartrate (LOPRESSOR ) 25 MG tablet Take 0.5 tablets (12.5 mg total) by mouth 2 (two) times daily. 10/02/23   Laurann Pollock, MD  naproxen  (NAPROSYN ) 500 MG tablet Take 1 tablet (500 mg total) by mouth 2 (two) times daily with a meal. 08/13/23   Early Glisson, MD  nortriptyline  (PAMELOR ) 10 MG  capsule Take 1 capsule by mouth at bedtime 01/29/24   Cook, Jayce G, DO  testosterone  cypionate (DEPOTESTOSTERONE CYPIONATE) 200 MG/ML injection Inject 200 mg into the muscle once a week. 09/27/23   [provider]  thyroid  (ARMOUR) 60 MG tablet Take 60 mg by mouth daily before breakfast.    [provider]  valACYclovir  (VALTREX ) 500 MG tablet Take 500 mg by mouth 2 (two) times daily. 03/08/21   [provider]      Allergies    Amoxicillin  and Sulfa  antibiotics    Review of Systems   Review of Systems  Cardiovascular:  Positive for chest pain.  Gastrointestinal:  Positive for abdominal pain and nausea.  All other systems reviewed and are negative.   Physical Exam Updated Vital Signs BP (!) 116/92 (BP Location: Right Arm)   Pulse 93   Temp 98.4 F (36.9 C)   Resp 19   SpO2 98%  Physical Exam Vitals and nursing note reviewed.  Constitutional:      Appearance: Normal appearance.  HENT:     Head: Normocephalic and atraumatic.     Nose: Nose normal.     Mouth/Throat:     Mouth: Mucous membranes are moist.  Eyes:     Extraocular Movements: Extraocular movements intact.     Conjunctiva/sclera: Conjunctivae normal.     Pupils: Pupils are equal, round, and reactive to light.  Cardiovascular:     Rate  and Rhythm: Normal rate and regular rhythm.     Pulses: Normal pulses.     Heart sounds: Normal heart sounds. Heart sounds not distant. No murmur heard. Pulmonary:     Effort: Pulmonary effort is normal. No tachypnea or respiratory distress.     Breath sounds: Normal breath sounds. No decreased breath sounds, wheezing, rhonchi or rales.  Chest:     Chest wall: No tenderness.  Abdominal:     General: Abdomen is flat. Bowel sounds are normal.     Palpations: Abdomen is soft. There is no mass.     Tenderness: There is no guarding.     Comments: Tenderness palpation to right upper quadrant and epigastric region  Musculoskeletal:        General: Normal range  of motion.     Cervical back: Normal range of motion and neck supple.     Right lower leg: No edema.     Left lower leg: No edema.  Skin:    General: Skin is warm and dry.     Findings: No rash.  Neurological:     General: No focal deficit present.     Mental Status: She is alert and oriented to person, place, and time. Mental status is at baseline.     Cranial Nerves: No cranial nerve deficit.     Motor: No weakness.  Psychiatric:        Mood and Affect: Mood normal.        Behavior: Behavior normal.        Thought Content: Thought content normal.        Judgment: Judgment normal.     ED Results / Procedures / Treatments   Labs (all labs ordered are listed, but only abnormal results are displayed) Labs Reviewed  BASIC METABOLIC PANEL WITH GFR  CBC  HEPATIC FUNCTION PANEL  D-DIMER, QUANTITATIVE  LIPASE, BLOOD  TROPONIN I (HIGH SENSITIVITY)    EKG None  Radiology DG Chest 2 View Result Date: 03/24/2024 CLINICAL DATA:  Chest pain EXAM: CHEST - 2 VIEW COMPARISON:  08/21/2023 FINDINGS: The lungs are clear without focal pneumonia, edema, pneumothorax or pleural effusion. The cardiopericardial silhouette is within normal limits for size. No acute bony abnormality. Telemetry leads overlie the chest. IMPRESSION: No active cardiopulmonary disease. Electronically Signed   By: Donnal Fusi M.D.   On: 03/24/2024 13:37    Procedures Procedures    Medications Ordered in ED Medications  morphine  (PF) 4 MG/ML injection 2 mg (has no administration in time range)  ondansetron  (ZOFRAN ) injection 4 mg (has no administration in time range)    ED Course/ Medical Decision Making/ A&P                                 Medical Decision Making Amount and/or Complexity of Data Reviewed Labs: ordered. Radiology: ordered.  Risk Prescription drug management. Decision regarding hospitalization.   This patient presents to the ED for concern of right upper quad abdominal pain,  right-sided chest pain, this involves an extensive number of treatment options, and is a complaint that carries with it a high risk of complications and morbidity.  The differential diagnosis includes ACS, pneumonia, pneumothorax, hemothorax, aortic aneurysm or dissection, acute cholecystitis, small bowel obstruction, pancreatitis   Co morbidities that complicate the patient evaluation  None   Additional history obtained:  Additional history obtained from husband External records from outside source obtained and reviewed including  medical records   Lab Tests:  I Ordered, and personally interpreted labs.  The pertinent results include: No leukocytosis, no anemia, normal electrolytes, normal kidney function, negative D-dimer, negative serial troponins, normal liver enzymes, elevated lipase   Imaging Studies ordered:  I ordered imaging studies including CT scan of the abdomen and pelvis I independently visualized and interpreted imaging which showed no acute surgical process I agree with the radiologist interpretation   Cardiac Monitoring: / EKG:  The patient was maintained on a cardiac monitor.  I personally viewed and interpreted the cardiac monitored which showed an underlying rhythm of: Normal sinus rhythm, no ST changes, nonspecific T wave changes, no STEMI   Consultations Obtained:  I requested consultation with the general surgery, Dr. Larrie Po,  and discussed lab and imaging findings as well as pertinent plan - they recommend: Admission, ultrasound in the morning   Problem List / ED Course / Critical interventions / Medication management  Patient does remain stable at this time.  She continues to have ongoing nausea but notes that pain has improved.  Discussed with patient that blood work is consistent with acute pancreatitis though this still may have an underlying component of acute cholecystitis.  Did discuss findings with Dr. Larrie Po with general surgery who did  recommend admission and ultrasound in the morning.  Will start antibiotics at this time presents with to cover.  She has been started on IV fluids as well as pain medication.  Did discuss patient case with Dr. Odella Bending with the hospitalist service who has excepted for admission at this time. I ordered medication including Rocephin, Flagyl, morphine , Zofran , IV fluids for acute pancreatitis, possible cholecystitis Reevaluation of the patient after these medicines showed that the patient improved I have reviewed the patients home medicines and have made adjustments as needed   Social Determinants of Health:  None   Test / Admission - Considered:  Admission        Final Clinical Impression(s) / ED Diagnoses Final diagnoses:  None    Rx / DC Orders ED Discharge Orders     None         Roselynn Connors, PA-C 03/24/24 1732    Deatra Face, MD 03/26/24 [phone]

## 2024-03-24 NOTE — ED Triage Notes (Signed)
 Pt states this morning she began to have a pressure in her back that came around to her chest/epigastic area. Pt states she got sob, nauseated, cotton mouth. Still having some discomfort but not as bad.

## 2024-03-24 NOTE — H&P (Signed)
 History and Physical:    Mary [name]   NWG:[id number] DOB: May 03, 1966 DOA: 03/24/2024  Referring MD/provider: Veryl Gottron  PCP: Cook, Jayce G, DO   Patient coming from: Home  Chief Complaint: Abdominal pain  History of Present Illness:   Mary [name] is an 58 y.o. female with a past medical history of Hashimoto's disease, fibromyalgia, migraine headaches who presents with 10/10 abdominal pain in the right upper quadrant radiating to her back which began earlier today shortly after eating breakfast.  Patient also endorsed severe nausea but no frank vomiting and denies any black or bloody stools.  She moved her bowels earlier today and described the bowel movement as normal.  She has never had any prior similar pain.  She does take some herbal supplements and thyroid  medications, but does not use drugs or drink alcohol.  ED Course:  The patient was well-appearing and had normal vital signs: Blood pressure 120/68, pulse 75, temperature 98.4 F (36.9 C), resp. rate 13, SpO2 98%.  A basic metabolic panel and a CBC were both normal.  Troponin was negative x 2.  Liver function studies were completely normal.  D-dimer was not elevated.  Lipase, however was noted to be 270.  Chest x-ray showed no active cardiopulmonary disease and a CT scan of the abdomen and pelvis was normal with no radiopaque stones or wall thickening of the gallbladder appreciated, no intrahepatic or extrahepatic biliary ductal dilatation.  She was given 1 g of Rocephin, 500 mg of Flagyl, 2 mg of morphine  followed by 4 mg of morphine , 4 mg of Zofran  and a liter of lactated Ringer's in the ED.  Morphine  helped alleviate her pain but by the time I saw her, the pain was returning.  ROS:   Review of Systems  Constitutional: Negative.   HENT: Negative.    Eyes: Negative.   Respiratory: Negative.    Cardiovascular: Negative.   Gastrointestinal:  Positive for abdominal pain, constipation and nausea. Negative for  blood in stool and melena.  Genitourinary: Negative.   Musculoskeletal: Negative.   Skin: Negative.   Neurological: Negative.   Endo/Heme/Allergies: Negative.   Psychiatric/Behavioral: Negative.      Past Medical History:   Past Medical History:  Diagnosis Date   Fibromyalgia    Migraine    Thyroid  disease     Past Surgical History:   Past Surgical History:  Procedure Laterality Date   TONSILLECTOMY     TUBAL LIGATION     uterine ablation  2004   irregular heavy periods.    Social History:   Social History   Socioeconomic History   Marital status: Married    Spouse name: Not on file   Number of children: Not on file   Years of education: Not on file   Highest education level: 12th grade  Occupational History   Not on file  Tobacco Use   Smoking status: Former    Types: Cigarettes   Smokeless tobacco: Never   Tobacco comments:    quit feb 2016  Vaping Use   Vaping status: Never Used  Substance and Sexual Activity   Alcohol use: No    Alcohol/week: 0.0 standard drinks of alcohol   Drug use: No   Sexual activity: Yes    Birth control/protection: Surgical  Other Topics Concern   Not on file  Social History Narrative   Married since 1998.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   Social Drivers of Corporate investment banker  Strain: Medium Risk (03/06/2023)   Overall Financial Resource Strain (CARDIA)    Difficulty of Paying Living Expenses: Somewhat hard  Food Insecurity: No Food Insecurity (03/06/2023)   Hunger Vital Sign    Worried About Running Out of Food in the Last Year: Never true    Ran Out of Food in the Last Year: Never true  Transportation Needs: No Transportation Needs (03/06/2023)   PRAPARE - Administrator, Civil Service (Medical): No    Lack of Transportation (Non-Medical): No  Physical Activity: Insufficiently Active (03/06/2023)   Exercise Vital Sign    Days of Exercise per Week:  2 days    Minutes of Exercise per Session: 20 min  Stress: Stress Concern Present (03/06/2023)   [employer] of Occupational Health - Occupational Stress Questionnaire    Feeling of Stress : To some extent  Social Connections: Socially Integrated (03/06/2023)   Social Connection and Isolation Panel [NHANES]    Frequency of Communication with Friends and Family: More than three times a week    Frequency of Social Gatherings with Friends and Family: Twice a week    Attends Religious Services: More than 4 times per year    Active Member of [employer] or Organizations: Yes    Attends Engineer, structural: More than 4 times per year    Marital Status: Married  Catering manager Violence: Unknown (01/28/2022)   Received from [employer], [facility]   HITS    Physically Hurt: Not on file    Insult or Talk Down To: Not on file    Threaten Physical Harm: Not on file    Scream or Curse: Not on file    Allergies   Amoxicillin  and Sulfa  antibiotics  Family history:   Family History  Problem Relation Age of Onset   Heart disease Mother    Depression Mother    Heart disease Father    Depression Father    Depression Maternal Uncle    Colon cancer Other        great grandmother   Stomach cancer Neg Hx    Esophageal cancer Neg Hx    Pancreatic cancer Neg Hx     Current Medications:   Prior to Admission medications   Medication Sig Start Date End Date Taking? Authorizing Provider  amoxicillin  (AMOXIL ) 875 MG tablet Take 875 mg by mouth 2 (two) times daily. 03/14/24   [provider]  busPIRone  (BUSPAR ) 10 MG tablet Take 1 tablet (10 mg total) by mouth 2 (two) times daily. 01/08/24   Mozingo, Regina Nattalie, NP  estradiol  (ESTRACE ) 0.1 MG/GM vaginal cream Place 1 Applicatorful vaginally 2 (two) times a week.    [provider]  FLUoxetine  (PROZAC ) 20 MG capsule Take 1 capsule by mouth once daily 01/27/24   Mozingo, Regina Nattalie, NP  lamoTRIgine  (LAMICTAL )  100 MG tablet Take 1 tablet by mouth once daily 01/27/24   Mozingo, Regina Nattalie, NP  LORazepam  (ATIVAN ) 0.5 MG tablet Take 1 tablet (0.5 mg total) by mouth 2 (two) times daily as needed. 01/08/24   Mozingo, Regina Nattalie, NP  metoprolol  tartrate (LOPRESSOR ) 25 MG tablet Take 0.5 tablets (12.5 mg total) by mouth 2 (two) times daily. 10/02/23   Laurann Pollock, MD  naproxen  (NAPROSYN ) 500 MG tablet Take 1 tablet (500 mg total) by mouth 2 (two) times daily with a meal. 08/13/23   Early Glisson, MD  nortriptyline  (PAMELOR ) 10 MG capsule Take 1 capsule by mouth at bedtime 01/29/24  Cook, Jayce G, DO  NP THYROID  15 MG tablet Take 15 mg by mouth daily.    [provider]  progesterone  (PROMETRIUM ) 200 MG capsule Take 200 mg by mouth at bedtime. 01/09/24   [provider]  testosterone  cypionate (DEPOTESTOSTERONE CYPIONATE) 200 MG/ML injection Inject 200 mg into the muscle once a week. 09/27/23   [provider]  thyroid  (ARMOUR) 60 MG tablet Take 60 mg by mouth daily before breakfast.    [provider]  valACYclovir  (VALTREX ) 500 MG tablet Take 500 mg by mouth 2 (two) times daily. 03/08/21   [provider]    Physical Exam:   Vitals:   03/24/24 1530 03/24/24 1600 03/24/24 1630 03/24/24 1751  BP: (!) 118/57 120/68 118/71 138/87  Pulse: 75 75 69 77  Resp: 14 13 13 18   Temp:    97.9 F (36.6 C)  TempSrc:    Oral  SpO2: 97% 98% 99% 98%     Physical Exam: Blood pressure 138/87, pulse 77, temperature 97.9 F (36.6 C), temperature source Oral, resp. rate 18, SpO2 98%. Gen: No acute distress. Head: Normocephalic, atraumatic. Eyes: Pupils equal, round and reactive to light. Extraocular movements intact.  Sclerae nonicteric. No lid lag. Mouth: Oropharynx reveals moist mucous membranes. Dentition is good. Neck: Supple, no thyromegaly, no lymphadenopathy, no jugular venous distention. Chest: Lungs are clear to auscultation with good air movement. No  rales, rhonchi or wheezes.  CV: Heart sounds are regular with an S1, S2. No murmurs, rubs, clicks, or gallops.  Abdomen: Soft, nondistended with diminished bowel sounds. No hepatosplenomegaly or palpable masses.  Mild tenderness. Extremities: Extremities are without clubbing, or cyanosis. No edema. Pedal pulses 2+.  Skin: Warm and dry. No rashes, lesions or wounds. Neuro: Alert and oriented times 3; grossly nonfocal.  Psych: Insight is good and judgment is appropriate. Mood and affect normal.   Data Review:    Labs: Basic Metabolic Panel: Recent Labs  Lab 03/24/24 1307  NA 139  K 4.0  CL 106  CO2 25  GLUCOSE 93  BUN 16  CREATININE 0.82  CALCIUM  8.9   Liver Function Tests: Recent Labs  Lab 03/24/24 1307  AST 33  ALT 38  ALKPHOS 73  BILITOT 0.9  PROT 7.0  ALBUMIN 3.8   Recent Labs  Lab 03/24/24 1307  LIPASE 270*    CBC: Recent Labs  Lab 03/24/24 1307  WBC 9.7  HGB 13.8  HCT 40.3  MCV 92.2  PLT 285     Radiographic Studies: CT ABDOMEN PELVIS W CONTRAST Result Date: 03/24/2024 CLINICAL DATA:  Epigastric pain EXAM: CT ABDOMEN AND PELVIS WITH CONTRAST TECHNIQUE: Multidetector CT imaging of the abdomen and pelvis was performed using the standard protocol following bolus administration of intravenous contrast. RADIATION DOSE REDUCTION: This exam was performed according to the departmental dose-optimization program which includes automated exposure control, adjustment of the mA and/or kV according to patient size and/or use of iterative reconstruction technique. CONTRAST:  OMNIPAQUE IOHEXOL 300 MG/ML  SOLN COMPARISON:  December 20, 2016 FINDINGS: Lower chest: No focal airspace consolidation or pleural effusion. Hepatobiliary: No mass.No radiopaque stones or wall thickening of the gallbladder. No intrahepatic or extrahepatic biliary ductal dilation. The portal veins are patent. Pancreas: No mass or main ductal dilation. No peripancreatic inflammation or fluid  collection. Spleen: Normal size. No mass. Adrenals/Urinary Tract: No adrenal masses. Subcentimeter hypodensity noted in the right kidney, too small to definitively characterize, but likely a small cyst. No nephrolithiasis or hydronephrosis.  The urinary bladder is distended without focal abnormality. Stomach/Bowel: The stomach is decompressed without focal abnormality. No small bowel wall thickening or inflammation. No small bowel obstruction.Normal appendix. Vascular/Lymphatic: No aortic aneurysm. No intraabdominal or pelvic lymphadenopathy. Reproductive: Age-related atrophy of the uterus and ovaries. No concerning adnexal mass.No free pelvic fluid. Other: No pneumoperitoneum, ascites, or mesenteric inflammation. Musculoskeletal: No acute fracture or destructive lesion. IMPRESSION: No acute intraabdominal or pelvic abnormality. Electronically Signed   By: Rance Burrows M.D.   On: 03/24/2024 15:45   DG Chest 2 View Result Date: 03/24/2024 CLINICAL DATA:  Chest pain EXAM: CHEST - 2 VIEW COMPARISON:  08/21/2023 FINDINGS: The lungs are clear without focal pneumonia, edema, pneumothorax or pleural effusion. The cardiopericardial silhouette is within normal limits for size. No acute bony abnormality. Telemetry leads overlie the chest. IMPRESSION: No active cardiopulmonary disease. Electronically Signed   By: Donnal Fusi M.D.   On: 03/24/2024 13:37    EKG: Independently reviewed.  Normal sinus rhythm with nonspecific T wave abnormalities.   Assessment/Plan:   Principal Problem:   Pancreatitis, acute Unclear trigger.  The patient denies alcohol use and other than iron, has not had any new medications.  She is, however, on a multitude of various supplements any of which could be responsible for causing acute pancreatitis potentially.  Will hold all of these.  Will admit for observation and bowel rest.  I do not think there is any utility to continuing antibiotics at this point so will not place her on any  empiric antibiotics.  There is no convincing evidence of any kind of infection.  Her white count is normal.  The EDP spoke with Dr. Larrie Po who is planning to see her in the morning.  Will get an abdominal ultrasound to rule out gallstones.  In the meantime, we will place her on bowel rest.  Active Problems:   Fibromyalgia affecting multiple sites No current complaints.    Hypothyroidism Continue thyroid  Armour 60 mg daily.    Bipolar 2 disorder (HCC) Continue Lamictal .  HIV screening The patient falls between the ages of 13-64 and should be screened for HIV, therefore HIV testing ordered.  There is no height or weight on file to calculate BMI.  Other information:   DVT prophylaxis: SCDs ordered.  The patient is in observation status. Code Status: Full code. Family Communication: Mother at the bedside. Disposition Plan: Home when stable. Consults called: EDP consulted Dr. Larrie Po. Admission status: Observation.  The medical decision making on this patient was of high complexity and the patient is at high risk for clinical deterioration, therefore this is a level 3 visit.    Craige Dixon Claudell Wohler [facility] Pager [phone]   How to contact the [facility] Attending or Consulting provider 7A - 7P or covering provider during after hours 7P -7A, for this patient?   Check the care team in [facility] and look for a) attending/consulting [facility] provider listed and b) the TRH team listed Log into [email or web address] and use [place]'s universal password to access. If you do not have the password, please contact the hospital operator. Locate the TR[facility] provider you are looking for under [facility] and page to a number that you can be directly reached. If you still have difficulty reaching the provider, please page the [facility] (Director on Call) for the Hospitalists listed on amion for assistance.  03/24/2024, 5:52 PM

## 2024-03-24 NOTE — ED Notes (Addendum)
 Pt states the pain started suddenly after eating cereal this AM and described at tight. The pain was located around the R side of the back, RUQ, and R lower chest area that was accompanied with nausea. She was unable to take a deep breath because it would worsen the pain. She did have 1 normal BM after the pain started that did no relieve the pain. She denies any eructations. The pain lasted for about 35-35 and then gradually eased off.

## 2024-03-25 ENCOUNTER — Observation Stay (HOSPITAL_COMMUNITY)

## 2024-03-25 DIAGNOSIS — F3181 Bipolar II disorder: Secondary | ICD-10-CM

## 2024-03-25 DIAGNOSIS — E039 Hypothyroidism, unspecified: Secondary | ICD-10-CM

## 2024-03-25 DIAGNOSIS — K858 Other acute pancreatitis without necrosis or infection: Secondary | ICD-10-CM | POA: Diagnosis not present

## 2024-03-25 DIAGNOSIS — M797 Fibromyalgia: Secondary | ICD-10-CM

## 2024-03-25 DIAGNOSIS — K859 Acute pancreatitis without necrosis or infection, unspecified: Secondary | ICD-10-CM | POA: Diagnosis not present

## 2024-03-25 LAB — COMPREHENSIVE METABOLIC PANEL WITH GFR
ALT: 62 U/L — ABNORMAL HIGH (ref 0–44)
AST: 38 U/L (ref 15–41)
Albumin: 3.2 g/dL — ABNORMAL LOW (ref 3.5–5.0)
Alkaline Phosphatase: 67 U/L (ref 38–126)
Anion gap: 9 (ref 5–15)
BUN: 11 mg/dL (ref 6–20)
CO2: 25 mmol/L (ref 22–32)
Calcium: 8.1 mg/dL — ABNORMAL LOW (ref 8.9–10.3)
Chloride: 106 mmol/L (ref 98–111)
Creatinine, Ser: 0.8 mg/dL (ref 0.44–1.00)
GFR, Estimated: >60 mL/min (ref >60)
Glucose, Bld: 85 mg/dL (ref 70–99)
Potassium: 3.8 mmol/L (ref 3.5–5.1)
Sodium: 140 mmol/L (ref 135–145)
Total Bilirubin: 0.7 mg/dL (ref 0.0–1.2)
Total Protein: 6 g/dL — ABNORMAL LOW (ref 6.5–8.1)

## 2024-03-25 LAB — GLUCOSE, CAPILLARY
Glucose-Capillary: 110 mg/dL — ABNORMAL HIGH (ref 70–99)
Glucose-Capillary: 84 mg/dL (ref 70–99)

## 2024-03-25 LAB — CBC
HCT: 37.2 % (ref 36.0–46.0)
Hemoglobin: 12.5 g/dL (ref 12.0–15.0)
MCH: 31.3 pg (ref 26.0–34.0)
MCHC: 33.6 g/dL (ref 30.0–36.0)
MCV: 93.2 fL (ref 80.0–100.0)
Platelets: 265 10*3/uL (ref 150–400)
RBC: 3.99 MIL/uL (ref 3.87–5.11)
RDW: 11.6 % (ref 11.5–15.5)
WBC: 6.3 10*3/uL (ref 4.0–10.5)
nRBC: 0 % (ref 0.0–0.2)

## 2024-03-25 LAB — LIPASE, BLOOD: Lipase: 45 U/L (ref 11–51)

## 2024-03-25 LAB — HIV ANTIBODY (ROUTINE TESTING W REFLEX): HIV Screen 4th Generation wRfx: NONREACTIVE

## 2024-03-25 MED ORDER — ACETAMINOPHEN 325 MG PO TABS
650.0000 mg | ORAL_TABLET | Freq: Four times a day (QID) | ORAL | Status: DC | PRN
  Administered 2024-03-25: 650 mg via ORAL
  Filled 2024-03-25: qty 2

## 2024-03-25 MED ORDER — LORAZEPAM 0.5 MG PO TABS
0.5000 mg | ORAL_TABLET | Freq: Two times a day (BID) | ORAL | Status: DC | PRN
  Administered 2024-03-25: 0.5 mg via ORAL
  Filled 2024-03-25: qty 1

## 2024-03-25 MED ORDER — ORAL CARE MOUTH RINSE: 15.0000 mL | OROMUCOSAL | Status: DC | PRN

## 2024-03-25 NOTE — Progress Notes (Signed)
 Progress Note   Patient: Mary [name]:[id number] DOB: [birth year] DOA: 03/24/2024     0 DOS: the patient was seen and examined on 03/25/2024   Brief hospital admission narrative: As per H&P written by Dr. Odella Bending on 03/24/2024 Mary [name] is an 58 y.o. female with a past medical history of Hashimoto's disease, fibromyalgia, migraine headaches who presents with 10/10 abdominal pain in the right upper quadrant radiating to her back which began earlier today shortly after eating breakfast.  Patient also endorsed severe nausea but no frank vomiting and denies any black or bloody stools.  She moved her bowels earlier today and described the bowel movement as normal.  She has never had any prior similar pain.  She does take some herbal supplements and thyroid  medications, but does not use drugs or drink alcohol.   ED Course:  The patient was well-appearing and had normal vital signs: Blood pressure 120/68, pulse 75, temperature 98.4 F (36.9 C), resp. rate 13, SpO2 98%.  A basic metabolic panel and a CBC were both normal.  Troponin was negative x 2.  Liver function studies were completely normal.  D-dimer was not elevated.  Lipase, however was noted to be 270.  Chest x-ray showed no active cardiopulmonary disease and a CT scan of the abdomen and pelvis was normal with no radiopaque stones or wall thickening of the gallbladder appreciated, no intrahepatic or extrahepatic biliary ductal dilatation.  She was given 1 g of Rocephin, 500 mg of Flagyl, 2 mg of morphine  followed by 4 mg of morphine , 4 mg of Zofran  and a liter of lactated Ringer's in the ED.  Morphine  helped alleviate her pain but by the time I saw her, the pain was returning.    Assessment and plan 1-acute pancreatitis - Lipase in the 260 range at time of admission; patient reporting right upper quadrant mid abdominal discomfort and associated nausea - Workup positive for cholelithiasis without presence of cholecystitis - LFTs and  WBCs within normal limits - Repeat lipase on today's examination back to normal range. - Case discussed with general surgery who will see patient in consultation on 03/26/2024 and has recommended full liquid diet and n.p.o. after midnight.  2-fibromyalgia - No significant complaints currently - Continue outpatient follow-up with PCP.  3-hypothyroidism - Continue thyroid  Armour  4-bipolar disorder - Continue BuSpar , Lamictal , Prozac  and Pamelor  - Mood is stable.  5-class I obesity -Body mass index is 29.29 kg/m.  - Low-calorie diet and portion control discussed with patient.  6-hypertension/SVT - Continue metoprolol  12.5 mg twice a day - Vital signs currently stable.  Subjective:  Afebrile, no chest pain, no vomiting.  Reported still some abdominal soreness and expressed feeling slightly nauseous.  Physical Exam: Vitals:   03/24/24 2026 03/25/24 0600 03/25/24 1230 03/25/24 1245  BP:  93/63 105/66 118/69  Pulse:  70 73 95  Resp: 20 20    Temp:  97.7 F (36.5 C) 98.4 F (36.9 C) 98.6 F (37 C)  TempSrc:  Oral Oral Oral  SpO2: 99% 99% 97% 99%  Weight: 75 kg     Height: 5\' 3"  (1.6 m)      General exam: Alert, awake, oriented x 3; in no major distress. Respiratory system: Clear to auscultation. Respiratory effort normal.  Good saturation on room air. Cardiovascular system:RRR. No murmurs, rubs, gallops. Gastrointestinal system: Abdomen is nondistended, soft and no guarding.  Bowel sounds. Central nervous system: No focal neurological deficits. Extremities: No cyanosis or clubbing. Skin: No petechiae.  Psychiatry: Judgement and insight appear normal. Mood & affect appropriate.    Data Reviewed: CBC: WBCs 6.3, hemoglobin 12.5 and platelet count 265K CMET: Sodium 140, potassium 3.8, chloride 106, bicarb 25, BUN 11, creatinine 0.80, normal LFTs except for ALT 62 and GFR >60  Family Communication: Husband and daughter at bedside.  Disposition: Status is: Observation The  patient remains OBS appropriate and will d/c before 2 midnights.  Anticipating discharge back home once medically stable.  Time spent: 50 minutes  Author: Justina Oman, MD 03/25/2024 5:05 PM  For on call review [email or web address].

## 2024-03-25 NOTE — Progress Notes (Signed)
   03/25/24 1419  TOC Brief Assessment  Insurance and Status Reviewed  Patient has primary care physician Yes  Home environment has been reviewed From home  Prior level of function: Independent  Prior/Current Home Services No current home services  Social Drivers of Health Review SDOH reviewed no interventions necessary  Readmission risk has been reviewed Yes  Transition of care needs no transition of care needs at this time   Transition of Care Department [facility]) has reviewed patient and no other TOC needs have been identified at this time. We will continue to monitor patient advancement through interdisciplinary progression rounds. If new patient needs arise, please place a TOC consult.

## 2024-03-25 NOTE — Plan of Care (Signed)
  Problem: Activity: Goal: Risk for activity intolerance will decrease Outcome: Progressing   Problem: Coping: Goal: Level of anxiety will decrease Outcome: Progressing   Problem: Skin Integrity: Goal: Risk for impaired skin integrity will decrease Outcome: Progressing   

## 2024-03-26 ENCOUNTER — Observation Stay (HOSPITAL_COMMUNITY): Admitting: Anesthesiology

## 2024-03-26 ENCOUNTER — Encounter (HOSPITAL_COMMUNITY): Payer: Self-pay | Admitting: Internal Medicine

## 2024-03-26 ENCOUNTER — Encounter (HOSPITAL_COMMUNITY)
Admission: EM | Disposition: A | Discharge status: Home / Self Care | Payer: Self-pay | Source: Home / Self Care | Attending: Emergency Medicine

## 2024-03-26 DIAGNOSIS — K851 Biliary acute pancreatitis without necrosis or infection: Secondary | ICD-10-CM | POA: Diagnosis not present

## 2024-03-26 DIAGNOSIS — K8000 Calculus of gallbladder with acute cholecystitis without obstruction: Secondary | ICD-10-CM

## 2024-03-26 DIAGNOSIS — K810 Acute cholecystitis: Secondary | ICD-10-CM | POA: Diagnosis not present

## 2024-03-26 DIAGNOSIS — F3181 Bipolar II disorder: Secondary | ICD-10-CM | POA: Diagnosis not present

## 2024-03-26 DIAGNOSIS — K8020 Calculus of gallbladder without cholecystitis without obstruction: Secondary | ICD-10-CM | POA: Diagnosis not present

## 2024-03-26 DIAGNOSIS — M797 Fibromyalgia: Secondary | ICD-10-CM | POA: Diagnosis not present

## 2024-03-26 SURGERY — CHOLECYSTECTOMY, ROBOT-ASSISTED, LAPAROSCOPIC
Anesthesia: General | Site: Abdomen

## 2024-03-26 MED ORDER — ONDANSETRON HCL 4 MG/2ML IJ SOLN
INTRAMUSCULAR | Status: AC
  Filled 2024-03-26: qty 2

## 2024-03-26 MED ORDER — DEXAMETHASONE SODIUM PHOSPHATE 10 MG/ML IJ SOLN
INTRAMUSCULAR | Status: AC
  Filled 2024-03-26: qty 1

## 2024-03-26 MED ORDER — OXYCODONE HCL 5 MG PO TABS: 5.0000 mg | ORAL_TABLET | Freq: Four times a day (QID) | ORAL | 0 refills | Status: DC | PRN

## 2024-03-26 MED ORDER — CHLORHEXIDINE GLUCONATE 0.12 % MT SOLN
15.0000 mL | Freq: Once | OROMUCOSAL | Status: AC
  Administered 2024-03-26: 15 mL via OROMUCOSAL

## 2024-03-26 MED ORDER — CEFOTETAN DISODIUM 2 G IJ SOLR
2.0000 g | INTRAMUSCULAR | Status: AC
  Administered 2024-03-26: 2 g via INTRAVENOUS
  Filled 2024-03-26: qty 2

## 2024-03-26 MED ORDER — LIDOCAINE 2% (20 MG/ML) 5 ML SYRINGE
INTRAMUSCULAR | Status: AC
  Filled 2024-03-26: qty 5

## 2024-03-26 MED ORDER — KETOROLAC TROMETHAMINE 30 MG/ML IJ SOLN
INTRAMUSCULAR | Status: AC
  Filled 2024-03-26: qty 1

## 2024-03-26 MED ORDER — ONDANSETRON HCL 4 MG/2ML IJ SOLN
INTRAMUSCULAR | Status: DC | PRN
  Administered 2024-03-26: 4 mg via INTRAVENOUS

## 2024-03-26 MED ORDER — ONDANSETRON HCL 4 MG/2ML IJ SOLN: 4.0000 mg | Freq: Once | INTRAMUSCULAR | Status: DC | PRN

## 2024-03-26 MED ORDER — DEXMEDETOMIDINE HCL IN NACL 80 MCG/20ML IV SOLN
INTRAVENOUS | Status: AC
  Filled 2024-03-26: qty 20

## 2024-03-26 MED ORDER — DEXMEDETOMIDINE HCL IN NACL 80 MCG/20ML IV SOLN
INTRAVENOUS | Status: DC | PRN
  Administered 2024-03-26: 8 ug via INTRAVENOUS

## 2024-03-26 MED ORDER — CHLORHEXIDINE GLUCONATE CLOTH 2 % EX PADS
6.0000 | MEDICATED_PAD | Freq: Once | CUTANEOUS | Status: AC
  Administered 2024-03-26: 6 via TOPICAL

## 2024-03-26 MED ORDER — OXYCODONE HCL 5 MG/5ML PO SOLN: 5.0000 mg | Freq: Once | ORAL | Status: DC | PRN

## 2024-03-26 MED ORDER — FENTANYL CITRATE (PF) 100 MCG/2ML IJ SOLN
INTRAMUSCULAR | Status: DC | PRN
  Administered 2024-03-26: 100 ug via INTRAVENOUS

## 2024-03-26 MED ORDER — STERILE WATER FOR IRRIGATION IR SOLN
Status: DC | PRN
  Administered 2024-03-26: 500 mL

## 2024-03-26 MED ORDER — CHLORHEXIDINE GLUCONATE CLOTH 2 % EX PADS: 6.0000 | MEDICATED_PAD | Freq: Once | CUTANEOUS | Status: DC

## 2024-03-26 MED ORDER — SODIUM CHLORIDE 0.9 % IV SOLN
INTRAVENOUS | Status: AC
  Filled 2024-03-26: qty 2

## 2024-03-26 MED ORDER — SCOPOLAMINE 1 MG/3DAYS TD PT72
MEDICATED_PATCH | TRANSDERMAL | Status: AC
  Administered 2024-03-26: 1.5 mg via TRANSDERMAL
  Filled 2024-03-26: qty 1

## 2024-03-26 MED ORDER — BUPIVACAINE HCL (PF) 0.5 % IJ SOLN
INTRAMUSCULAR | Status: DC | PRN
Start: 2024-03-26 — End: 2024-03-26
  Administered 2024-03-26: 30 mL

## 2024-03-26 MED ORDER — SUGAMMADEX SODIUM 200 MG/2ML IV SOLN
INTRAVENOUS | Status: DC | PRN
Start: 2024-03-26 — End: 2024-03-26
  Administered 2024-03-26: 200 mg via INTRAVENOUS

## 2024-03-26 MED ORDER — OXYCODONE HCL 5 MG PO TABS: 5.0000 mg | ORAL_TABLET | Freq: Once | ORAL | Status: DC | PRN

## 2024-03-26 MED ORDER — ACETAMINOPHEN 500 MG PO TABS: 1000.0000 mg | ORAL_TABLET | Freq: Four times a day (QID) | ORAL | 0 refills | Status: AC

## 2024-03-26 MED ORDER — LIDOCAINE 2% (20 MG/ML) 5 ML SYRINGE
INTRAMUSCULAR | Status: DC | PRN
  Administered 2024-03-26: 60 mg via INTRAVENOUS

## 2024-03-26 MED ORDER — LACTATED RINGERS IV SOLN
INTRAVENOUS | Status: DC | PRN
Start: 2024-03-26 — End: 2024-03-26

## 2024-03-26 MED ORDER — ORAL CARE MOUTH RINSE
15.0000 mL | Freq: Once | OROMUCOSAL | Status: AC
Start: 2024-03-26 — End: 2024-03-26

## 2024-03-26 MED ORDER — INDOCYANINE GREEN 25 MG IV SOLR: 2.5000 mg | Freq: Once | INTRAVENOUS | Status: AC

## 2024-03-26 MED ORDER — ACETAMINOPHEN 500 MG PO TABS: 1000.0000 mg | ORAL_TABLET | Freq: Four times a day (QID) | ORAL | Status: DC

## 2024-03-26 MED ORDER — BUPIVACAINE HCL (PF) 0.5 % IJ SOLN
INTRAMUSCULAR | Status: AC
  Filled 2024-03-26: qty 30

## 2024-03-26 MED ORDER — FENTANYL CITRATE PF 50 MCG/ML IJ SOSY
PREFILLED_SYRINGE | INTRAMUSCULAR | Status: AC
  Filled 2024-03-26: qty 1

## 2024-03-26 MED ORDER — DEXAMETHASONE SODIUM PHOSPHATE 10 MG/ML IJ SOLN
INTRAMUSCULAR | Status: DC | PRN
  Administered 2024-03-26: 5 mg via INTRAVENOUS

## 2024-03-26 MED ORDER — PROPOFOL 10 MG/ML IV BOLUS
INTRAVENOUS | Status: DC | PRN
  Administered 2024-03-26: 150 mg via INTRAVENOUS
  Administered 2024-03-26: 20 mg via INTRAVENOUS

## 2024-03-26 MED ORDER — VALACYCLOVIR HCL 500 MG PO TABS: 500.0000 mg | ORAL_TABLET | Freq: Two times a day (BID) | ORAL | Status: AC | PRN

## 2024-03-26 MED ORDER — ROCURONIUM BROMIDE 10 MG/ML (PF) SYRINGE
PREFILLED_SYRINGE | INTRAVENOUS | Status: AC
  Filled 2024-03-26: qty 10

## 2024-03-26 MED ORDER — SCOPOLAMINE 1 MG/3DAYS TD PT72: 1.0000 | MEDICATED_PATCH | TRANSDERMAL | Status: DC

## 2024-03-26 MED ORDER — SENNOSIDES-DOCUSATE SODIUM 8.6-50 MG PO TABS
1.0000 | ORAL_TABLET | Freq: Two times a day (BID) | ORAL | Status: DC
  Administered 2024-03-26: 1 via ORAL
  Filled 2024-03-26: qty 1

## 2024-03-26 MED ORDER — KETOROLAC TROMETHAMINE 30 MG/ML IJ SOLN
INTRAMUSCULAR | Status: DC | PRN
  Administered 2024-03-26: 30 mg via INTRAVENOUS

## 2024-03-26 MED ORDER — LACTATED RINGERS IV SOLN: INTRAVENOUS | Status: DC

## 2024-03-26 MED ORDER — DOCUSATE SODIUM 100 MG PO CAPS
100.0000 mg | ORAL_CAPSULE | Freq: Two times a day (BID) | ORAL | 2 refills | Status: DC
Start: 2024-03-26 — End: 2024-09-03

## 2024-03-26 MED ORDER — OXYCODONE HCL 5 MG PO TABS: 5.0000 mg | ORAL_TABLET | ORAL | Status: DC | PRN

## 2024-03-26 MED ORDER — FENTANYL CITRATE PF 50 MCG/ML IJ SOSY
25.0000 ug | PREFILLED_SYRINGE | INTRAMUSCULAR | Status: DC | PRN
  Administered 2024-03-26 (×3): 50 ug via INTRAVENOUS
  Filled 2024-03-26 (×2): qty 1

## 2024-03-26 MED ORDER — ONDANSETRON HCL 4 MG PO TABS: 4.0000 mg | ORAL_TABLET | Freq: Every day | ORAL | 1 refills | Status: DC | PRN

## 2024-03-26 MED ORDER — INDOCYANINE GREEN 25 MG IV SOLR
INTRAVENOUS | Status: AC
  Administered 2024-03-26: 2.5 mg via INTRAVENOUS
  Filled 2024-03-26: qty 10

## 2024-03-26 MED ORDER — ROCURONIUM BROMIDE 10 MG/ML (PF) SYRINGE
PREFILLED_SYRINGE | INTRAVENOUS | Status: DC | PRN
  Administered 2024-03-26: 50 mg via INTRAVENOUS

## 2024-03-26 SURGICAL SUPPLY — 37 items
BLADE SURG 15 STRL LF DISP TIS (BLADE) ×2 IMPLANT
CAUTERY HOOK MNPLR 1.6 DVNC XI (INSTRUMENTS) ×2 IMPLANT
CHLORAPREP W/TINT 26 (MISCELLANEOUS) ×2 IMPLANT
CLIP LIGATING HEM O LOK PURPLE (MISCELLANEOUS) ×2 IMPLANT
DEFOGGER SCOPE WARM SEASHARP (MISCELLANEOUS) IMPLANT
DERMABOND ADVANCED .7 DNX12 (GAUZE/BANDAGES/DRESSINGS) ×2 IMPLANT
DRAPE ARM DVNC X/XI (DISPOSABLE) ×8 IMPLANT
DRAPE COLUMN DVNC XI (DISPOSABLE) ×2 IMPLANT
ELECTRODE REM PT RTRN 9FT ADLT (ELECTROSURGICAL) ×2 IMPLANT
FORCEPS BPLR R/ABLATION 8 DVNC (INSTRUMENTS) ×2 IMPLANT
FORCEPS PROGRASP DVNC XI (FORCEP) ×2 IMPLANT
GLOVE BIOGEL M 7.0 STRL (GLOVE) IMPLANT
GLOVE BIOGEL PI IND STRL 6.5 (GLOVE) ×4 IMPLANT
GLOVE BIOGEL PI IND STRL 7.0 (GLOVE) ×6 IMPLANT
GLOVE SURG SS PI 6.5 STRL IVOR (GLOVE) ×4 IMPLANT
GOWN STRL REUS W/TWL LRG LVL3 (GOWN DISPOSABLE) ×6 IMPLANT
GRASPER SUT TROCAR 14GX15 (MISCELLANEOUS) ×2 IMPLANT
IRRIGATOR SUCT 8 DISP DVNC XI (IRRIGATION / IRRIGATOR) IMPLANT
KIT TURNOVER KIT A (KITS) ×2 IMPLANT
MANIFOLD NEPTUNE II (INSTRUMENTS) ×2 IMPLANT
NDL HYPO 21X1.5 SAFETY (NEEDLE) ×2 IMPLANT
NDL INSUFFLATION 14GA 120MM (NEEDLE) ×2 IMPLANT
NEEDLE HYPO 21X1.5 SAFETY (NEEDLE) ×1 IMPLANT
NEEDLE INSUFFLATION 14GA 120MM (NEEDLE) ×1 IMPLANT
OBTURATOR OPTICALSTD 8 DVNC (TROCAR) ×2 IMPLANT
PACK LAP CHOLE LZT030E (CUSTOM PROCEDURE TRAY) ×2 IMPLANT
PAD ARMBOARD POSITIONER FOAM (MISCELLANEOUS) ×2 IMPLANT
PENCIL HANDSWITCHING (ELECTRODE) ×2 IMPLANT
POSITIONER HEAD 8X9X4 ADT (SOFTGOODS) ×2 IMPLANT
SEAL UNIV 5-12 XI (MISCELLANEOUS) ×8 IMPLANT
SET BASIN LINEN APH (SET/KITS/TRAYS/PACK) ×2 IMPLANT
SET TUBE SMOKE EVAC HIGH FLOW (TUBING) ×2 IMPLANT
SUT MNCRL AB 4-0 PS2 18 (SUTURE) ×4 IMPLANT
SUT VICRYL 0 UR6 27IN ABS (SUTURE) ×2 IMPLANT
SYR 30ML LL (SYRINGE) ×2 IMPLANT
SYSTEM RETRIEVL 5MM INZII UNIV (BASKET) ×2 IMPLANT
WATER STERILE IRR 500ML POUR (IV SOLUTION) ×2 IMPLANT

## 2024-03-26 NOTE — Progress Notes (Signed)
 Patient has discharge orders, patient able to dangle on the side of bed and get out of bed to the restroom with ease. Gait steady. Discharge teaching given to patient, no further questions at this time.

## 2024-03-26 NOTE — Anesthesia Preprocedure Evaluation (Signed)
 Anesthesia Evaluation  Patient identified by MRN, date of birth, ID band Patient awake    Reviewed: Allergy & Precautions, H&P , NPO status , Patient's Chart, lab work & pertinent test results, reviewed documented beta blocker date and time   Airway Mallampati: II  TM Distance: >3 FB Neck ROM: full    Dental no notable dental hx.    Pulmonary neg pulmonary ROS, former smoker   Pulmonary exam normal breath sounds clear to auscultation       Cardiovascular Exercise Tolerance: Good hypertension, negative cardio ROS  Rhythm:regular Rate:Normal     Neuro/Psych  Headaches PSYCHIATRIC DISORDERS Anxiety  Bipolar Disorder    Neuromuscular disease    GI/Hepatic negative GI ROS, Neg liver ROS,,,  Endo/Other  Hypothyroidism    Renal/GU negative Renal ROS  negative genitourinary   Musculoskeletal   Abdominal   Peds  Hematology negative hematology ROS (+)   Anesthesia Other Findings   Reproductive/Obstetrics negative OB ROS                             Anesthesia Physical Anesthesia Plan  ASA: 2  Anesthesia Plan: General and General ETT   Post-op Pain Management:    Induction:   PONV Risk Score and Plan: Ondansetron   Airway Management Planned:   Additional Equipment:   Intra-op Plan:   Post-operative Plan:   Informed Consent: I have reviewed the patients History and Physical, chart, labs and discussed the procedure including the risks, benefits and alternatives for the proposed anesthesia with the patient or authorized representative who has indicated his/her understanding and acceptance.     Dental Advisory Given  Plan Discussed with: CRNA  Anesthesia Plan Comments:        Anesthesia Quick Evaluation

## 2024-03-26 NOTE — Op Note (Signed)
 [facility] Operative Note  03/26/24  Preoperative Diagnosis: Gallstone pancreatitis   Postoperative Diagnosis: Gallstone pancreatitis, acute cholecystitis   Procedure(s) Performed: Robotic Assisted Laparoscopic Cholecystectomy   Surgeon: Lidia Reels, DO   Assistants: No qualified resident was available    Anesthesia: General endotracheal   Anesthesiologist: Coretha Dew, MD    Specimens: Gallbladder   Estimated Blood Loss: Minimal   Blood Replacement: None    Complications: None   Wound Class: Contaminated   Operative Indications: The patient was admitted to the hospital was pancreatitis and was noted to have cholelithiasis on US .  She had improvement in her abdominal pain, but decision was made to proceed with surgery while inpatient.  We discussed the risk of the procedure including but not limited to bleeding, infection, injury to the common bile duct, bile leak, need for further procedures, chance of subtotal cholecystectomy.   Findings:  Acutely inflamed gallbladder with edema Critical view of safety noted All clips intact at the end of the case Adequate hemostasis   Procedure: Firefly was given in the preoperative area. The patient was taken to the operating room and placed supine. General endotracheal anesthesia was induced. Intravenous antibiotics were administered per protocol.  An orogastric tube positioned to decompress the stomach. The abdomen was prepared and draped in the usual sterile fashion. A time-out was completed verifying correct patient, procedure, site, positioning, and implant(s) and/or special equipment prior to beginning this procedure.  Veress needle was placed at the infraumbilical area and insufflation was started after confirming a positive saline drop test and no immediate increase in abdominal pressure.  After reaching 15 mm, the Veress needle was removed and a 8 mm port was placed via optiview technique  infraumbilical, measuring 20 mm away from the suspected position of the gallbladder.  The abdomen was inspected and no abnormalities or injuries were found.  Under direct vision, ports were placed in the following locations in a semi curvilinear position around the target of the gallbladder: Two 8 mm ports on the patient's right each having 8cm clearance to the adjacent ports and one 8 mm port placed on the patient's left 8 cm from the umbilical port. Once ports were placed, the table was placed in the reverse Trendelenburg position with the right side up. The Xi platform was brought into the operative field and docked to the ports successfully.  An endoscope was placed through the umbilical port, prograsp through the most lateral right port, fenestrated bipolar to the port just right of the umbilicus, and then a hook cautery in the left port.   The dome of the gallbladder was grasped with prograsp and retracted over the dome of the liver. Adhesions between the gallbladder and omentum, duodenum and transverse colon were lysed via hook cautery. The infundibulum was grasped with the fenestrated grasper and retracted toward the right lower quadrant. This maneuver exposed Calot's triangle. Firefly was used throughout the dissection to ensure safe visualization of the cystic duct.  The peritoneum overlying the gallbladder infundibulum was then dissected and the cystic duct and cystic artery identified.  Critical view of safety with the liver bed clearly visible behind the duct and artery with no additional structures noted.  The cystic duct and cystic artery were doubly clipped and divided close to the gallbladder.    The gallbladder was then dissected from its peritoneal and liver bed attachments by electrocautery. Hemostasis was checked prior to removing the hook cautery.  The Tracie Fried was undocked and moved out of  the field.  A 5mm Endo Catch bag was then placed through the umbilical port and the gallbladder was  removed.  The gallbladder was passed off the table as a specimen. There was no evidence of bleeding from the gallbladder fossa or cystic artery or leakage of the bile from the cystic duct stump. The umbilical port site closed with a 0 vicryl with a PMI needle.  The abdomen was desufflated and secondary trocars were removed under direct vision. No bleeding was noted. Incisions were localized with marcaine.  All skin incisions were closed with subcuticular sutures of 4-0 monocryl and dermabond.   Final inspection revealed acceptable hemostasis. All counts were correct at the end of the case. The patient was awakened from anesthesia and extubated without complication. The OG tube was removed.  The patient went to the PACU in stable condition.   Lidia Reels, DO [facility] [address]low [place], [place] [address] [phone] (office)

## 2024-03-26 NOTE — Discharge Summary (Signed)
 Physician Discharge Summary   Patient: Mary [name] MRN: [id number] DOB: 09-28-1966  Admit date:     03/24/2024  Discharge date: 03/26/24  Discharge Physician: Justina Oman   PCP: Cook, Jayce G, DO   Recommendations at discharge:  Repeat basic metabolic panel to follow ultralights and renal function. Reassess blood pressure and heart rate to determine the need of restarting beta-blocker therapy.   Discharge Diagnoses: Principal Problem:   Pancreatitis, acute Active Problems:   Fibromyalgia affecting multiple sites   Hypothyroidism   Bipolar 2 disorder (HCC)   Acute pancreatitis   Calculus of gallbladder without cholecystitis without obstruction  Brief hospital admission narrative: As per H&P written by Dr. Odella Bending on 03/24/2024 Mary [name] is an 58 y.o. female with a past medical history of Hashimoto's disease, fibromyalgia, migraine headaches who presents with 10/10 abdominal pain in the right upper quadrant radiating to her back which began earlier today shortly after eating breakfast.  Patient also endorsed severe nausea but no frank vomiting and denies any black or bloody stools.  She moved her bowels earlier today and described the bowel movement as normal.  She has never had any prior similar pain.  She does take some herbal supplements and thyroid  medications, but does not use drugs or drink alcohol.   ED Course:  The patient was well-appearing and had normal vital signs: Blood pressure 120/68, pulse 75, temperature 98.4 F (36.9 C), resp. rate 13, SpO2 98%.  A basic metabolic panel and a CBC were both normal.  Troponin was negative x 2.  Liver function studies were completely normal.  D-dimer was not elevated.  Lipase, however was noted to be 270.  Chest x-ray showed no active cardiopulmonary disease and a CT scan of the abdomen and pelvis was normal with no radiopaque stones or wall thickening of the gallbladder appreciated, no intrahepatic or extrahepatic biliary  ductal dilatation.  She was given 1 g of Rocephin, 500 mg of Flagyl, 2 mg of morphine  followed by 4 mg of morphine , 4 mg of Zofran  and a liter of lactated Ringer's in the ED.  Morphine  helped alleviate her pain but by the time I saw her, the pain was returning.  Assessment and Plan: 1-acute gallstone pancreatitis - Lipase in the 260 range at time of admission; patient reporting right upper quadrant mid abdominal discomfort and associated nausea - LFTs and WBCs within normal limits - Repeat lipase prior to discharge within normal limits. - Case discussed with general surgery and after following right upper quadrant ultrasound with cholelithiasis decision made to pursued laparoscopic cholecystectomy. -Patient tolerated procedure well and was tolerating diet and not experiencing nausea or vomiting at time of discharge - Outpatient follow-up with PCP and general surgery recommended.   2-fibromyalgia - No significant complaints currently - Continue outpatient follow-up with PCP.   3-hypothyroidism - Continue thyroid  Armour   4-bipolar disorder - Continue BuSpar , Lamictal , Prozac  and Pamelor  - Mood is stable.   5-class I obesity -Body mass index is 29.29 kg/m.  - Low-calorie diet and portion control discussed with patient.   6-hypertension/SVT - Vital signs overall stable - Reassess patient blood pressure and heart rate to determine the need of metoprolol  resumption (patient expressed no longer taking it).  Consultants: General Surgery Procedures performed: See below for x-ray report; laparoscopic cholecystectomy (03/26/2024). Disposition: Home Diet recommendation: Heart healthy/low-fat diet.  DISCHARGE MEDICATION: Allergies as of 03/26/2024       Reactions   Amoxicillin  Hives   Sulfa  Antibiotics Hives, Rash,  Other (See Comments)   Lip swelling        Medication List     STOP taking these medications    ALOE PO   ascorbic acid 500 MG tablet Commonly known as: VITAMIN C    BEET ROOT PO   COPPER PO   MEDI-LYTE PO   naproxen  500 MG tablet Commonly known as: Naprosyn        TAKE these medications    acetaminophen  500 MG tablet Commonly known as: TYLENOL  Take 2 tablets (1,000 mg total) by mouth every 6 (six) hours for 7 days.   amoxicillin  875 MG tablet Commonly known as: AMOXIL  Take 875 mg by mouth 2 (two) times daily.   b complex vitamins capsule Take 1 capsule by mouth daily.   busPIRone  10 MG tablet Commonly known as: BUSPAR  Take 1 tablet (10 mg total) by mouth 2 (two) times daily.   docusate sodium 100 MG capsule Commonly known as: Colace Take 1 capsule (100 mg total) by mouth 2 (two) times daily.   estradiol  0.1 MG/GM vaginal cream Commonly known as: ESTRACE  Place 1 Applicatorful vaginally daily as needed (irritation, pain).   ferrous sulfate 325 (65 FE) MG tablet Take 325 mg by mouth daily with breakfast.   FIBER COMPLETE PO Take 1 capsule by mouth daily. Omnifiber   FLUoxetine  20 MG capsule Commonly known as: PROZAC  Take 1 capsule by mouth once daily   ibuprofen  200 MG tablet Commonly known as: ADVIL  Take 400 mg by mouth every 6 (six) hours as needed for mild pain (pain score 1-3).   lamoTRIgine  100 MG tablet Commonly known as: LAMICTAL  Take 1 tablet by mouth once daily   LORazepam  0.5 MG tablet Commonly known as: ATIVAN  Take 1 tablet (0.5 mg total) by mouth 2 (two) times daily as needed. What changed: reasons to take this   nortriptyline  10 MG capsule Commonly known as: PAMELOR  Take 1 capsule by mouth at bedtime   NP Thyroid  15 MG tablet Generic drug: thyroid  Take 15 mg by mouth daily. 15 mg and 60 mg   NP Thyroid  60 MG tablet Generic drug: thyroid  Take 60 mg by mouth daily before breakfast. 15 mg and 60 mg   ondansetron  4 MG tablet Commonly known as: Zofran  Take 1 tablet (4 mg total) by mouth daily as needed for nausea or vomiting.   OVER THE COUNTER MEDICATION Apply 1 Application topically daily as  needed (pain). Magnesium rub   oxyCODONE  5 MG immediate release tablet Commonly known as: Roxicodone  Take 1 tablet (5 mg total) by mouth every 6 (six) hours as needed.   progesterone  200 MG capsule Commonly known as: PROMETRIUM  Take 200 mg by mouth at bedtime.   QC TUMERIC COMPLEX PO Take 1 capsule by mouth daily.   testosterone  cypionate 200 MG/ML injection Commonly known as: DEPOTESTOSTERONE CYPIONATE Inject 200 mg into the muscle once a week.   valACYclovir  500 MG tablet Commonly known as: VALTREX  Take 1 tablet (500 mg total) by mouth every 12 (twelve) hours as needed. What changed:  when to take this reasons to take this   VITAMIN D PO Take 7,500 Units by mouth daily.        Follow-up Information     Pappayliou, Arie Begin, DO. Call.   Specialty: General Surgery Why: I will call you for a phone follow up in 2-3 weeks Contact information: [address] [place] [facility] [address] [phone]         Cook, Jayce G, DO. Schedule an  appointment as soon as possible for a visit in 2 week(s).   Specialty: Family Medicine Contact information: [address] [place] [address] [phone]                Discharge Exam: Cleavon Curls Weights   03/24/24 2026 03/26/24 0834  Weight: 75 kg 75 kg   General exam: Alert, awake, oriented x 3; in no major distress.  Patient denies nausea or vomiting.  Tolerated diet. Respiratory system: Clear to auscultation. Respiratory effort normal.  Good saturation on room air. Cardiovascular system:RRR. No murmurs, rubs, gallops. Gastrointestinal system: Abdomen is soft, no guarding, expected mild discomfort around perilaparoscopic wounds.  Positive bowel sounds. Central nervous system: No focal neurological deficits. Extremities: No cyanosis or clubbing. Skin: No petechiae. Psychiatry: Judgement and insight appear normal. Mood & affect appropriate.   Condition at discharge: Stable and improved.  The results of  significant diagnostics from this hospitalization (including imaging, microbiology, ancillary and laboratory) are listed below for reference.   Imaging Studies: US  Abdomen Limited RUQ (LIVER/GB) Result Date: 03/25/2024 CLINICAL DATA:  Acute pancreatitis EXAM: ULTRASOUND ABDOMEN LIMITED RIGHT UPPER QUADRANT COMPARISON:  CT abdomen and pelvis 03/24/2024 FINDINGS: Gallbladder: Gallstones are present measuring up to 1 cm. There is no gallbladder wall thickening or pericholecystic fluid. Sonographic Abigail Abler sign is negative. Common bile duct: Diameter: 5 mm Liver: No focal lesion identified. Within normal limits in parenchymal echogenicity. Portal vein is patent on color Doppler imaging with normal direction of blood flow towards the liver. Other: None. IMPRESSION: Cholelithiasis without sonographic evidence of acute cholecystitis. Electronically Signed   By: Tyron Gallon M.D.   On: 03/25/2024 15:55   CT ABDOMEN PELVIS W CONTRAST Result Date: 03/24/2024 CLINICAL DATA:  Epigastric pain EXAM: CT ABDOMEN AND PELVIS WITH CONTRAST TECHNIQUE: Multidetector CT imaging of the abdomen and pelvis was performed using the standard protocol following bolus administration of intravenous contrast. RADIATION DOSE REDUCTION: This exam was performed according to the departmental dose-optimization program which includes automated exposure control, adjustment of the mA and/or kV according to patient size and/or use of iterative reconstruction technique. CONTRAST:  OMNIPAQUE IOHEXOL 300 MG/ML  SOLN COMPARISON:  December 20, 2016 FINDINGS: Lower chest: No focal airspace consolidation or pleural effusion. Hepatobiliary: No mass.No radiopaque stones or wall thickening of the gallbladder. No intrahepatic or extrahepatic biliary ductal dilation. The portal veins are patent. Pancreas: No mass or main ductal dilation. No peripancreatic inflammation or fluid collection. Spleen: Normal size. No mass. Adrenals/Urinary Tract: No adrenal  masses. Subcentimeter hypodensity noted in the right kidney, too small to definitively characterize, but likely a small cyst. No nephrolithiasis or hydronephrosis. The urinary bladder is distended without focal abnormality. Stomach/Bowel: The stomach is decompressed without focal abnormality. No small bowel wall thickening or inflammation. No small bowel obstruction.Normal appendix. Vascular/Lymphatic: No aortic aneurysm. No intraabdominal or pelvic lymphadenopathy. Reproductive: Age-related atrophy of the uterus and ovaries. No concerning adnexal mass.No free pelvic fluid. Other: No pneumoperitoneum, ascites, or mesenteric inflammation. Musculoskeletal: No acute fracture or destructive lesion. IMPRESSION: No acute intraabdominal or pelvic abnormality. Electronically Signed   By: Rance Burrows M.D.   On: 03/24/2024 15:45   DG Chest 2 View Result Date: 03/24/2024 CLINICAL DATA:  Chest pain EXAM: CHEST - 2 VIEW COMPARISON:  08/21/2023 FINDINGS: The lungs are clear without focal pneumonia, edema, pneumothorax or pleural effusion. The cardiopericardial silhouette is within normal limits for size. No acute bony abnormality. Telemetry leads overlie the chest. IMPRESSION: No active cardiopulmonary disease. Electronically Signed  By: Donnal Fusi M.D.   On: 03/24/2024 13:37    Microbiology: Results for orders placed or performed in visit on 10/28/20  Strep A DNA probe     Status: None   Collection Time: 10/28/20 12:00 AM   Specimen: Throat   Throat  Result Value Ref Range Status   Strep Gp A Direct, DNA Probe Negative Negative Final    Comment: Due to discontinuation of kit production by the manufacturer, this test will be made non-orderable at a future date when current supply is depleted. [facility] offers test [id number] Beta-Hemolytic Streptococcus Culture, Group A Only and test [id number] Streptococcus Group A Antigen, IA with Reflex to Culture.     Labs: CBC: Recent Labs  Lab 03/24/24 1307  03/25/24 0233  WBC 9.7 6.3  HGB 13.8 12.5  HCT 40.3 37.2  MCV 92.2 93.2  PLT 285 265   Basic Metabolic Panel: Recent Labs  Lab 03/24/24 1307 03/25/24 0233  NA 139 140  K 4.0 3.8  CL 106 106  CO2 25 25  GLUCOSE 93 85  BUN 16 11  CREATININE 0.82 0.80  CALCIUM  8.9 8.1*   Liver Function Tests: Recent Labs  Lab 03/24/24 1307 03/25/24 0233  AST 33 38  ALT 38 62*  ALKPHOS 73 67  BILITOT 0.9 0.7  PROT 7.0 6.0*  ALBUMIN 3.8 3.2*   CBG: Recent Labs  Lab 03/25/24 2047 03/25/24 2352  GLUCAP 110* 84    Discharge time spent: greater than 30 minutes.  Signed: Justina Oman, MD [facility] 03/26/2024

## 2024-03-26 NOTE — Consult Note (Signed)
 [facility] Consult  Reason for Consult: Gallstone pancreatitis Referring Physician: Dr. Michaelene Admire  Chief Complaint   Chest Pain     HPI: Mary [name] is a 58 y.o. female who was admitted to the hospital with presumed pancreatitis.  She started having a 1 day history of severe epigastric abdominal pain that radiated into her back and bilateral upper abdominal regions.  She has never had a pain like this in the past.  About 6 months ago she did have a similar pain more on the right side and into her chest, and she was evaluated by cardiology without any cardiac pathology noted.  She had some associated nausea without vomiting with this current episode of pain.  She was admitted to the hospital with concern for pancreatitis.  Her pain has improved since being in the hospital, and she was able to tolerate some soup yesterday without nausea or vomiting.  Her past medical history significant for thyroid  disease and hyperlipidemia.  She denies use of blood thinning medications.  Her surgical history is significant for a tubal ligation and uterine ablation.  At the time of admission, she was found to be hemodynamically stable.  She had no leukocytosis but a mildly elevated lipase at 270.  She underwent a CT of the abdomen and pelvis which demonstrated no acute abnormalities.  She was admitted with plan for ultrasound the next day.  Ultrasound demonstrated cholelithiasis without evidence of cholecystitis.  Given she was admitted for pancreatitis, she was thought to have gallstone pancreatitis at this point.  This morning the patient is feeling well.  Her abdominal pain is significantly improved.  She denies nausea and vomiting.  Past Medical History:  Diagnosis Date   Fibromyalgia    Migraine    Thyroid  disease     Past Surgical History:  Procedure Laterality Date   TONSILLECTOMY     TUBAL LIGATION     uterine ablation  2004   irregular heavy periods.    Family History   Problem Relation Age of Onset   Heart disease Mother    Depression Mother    Heart disease Father    Depression Father    Depression Maternal Uncle    Colon cancer Other        great grandmother   Stomach cancer Neg Hx    Esophageal cancer Neg Hx    Pancreatic cancer Neg Hx     Social History   Tobacco Use   Smoking status: Former    Types: Cigarettes   Smokeless tobacco: Never   Tobacco comments:    quit feb 2016  Vaping Use   Vaping status: Never Used  Substance Use Topics   Alcohol use: No    Alcohol/week: 0.0 standard drinks of alcohol   Drug use: No    Medications: I have reviewed the patient's current medications.  Allergies  Allergen Reactions   Amoxicillin  Hives   Sulfa  Antibiotics Hives, Rash and Other (See Comments)    Lip swelling     ROS:  Pertinent items are noted in HPI.  Blood pressure 122/76, pulse 72, temperature 98.3 F (36.8 C), temperature source Oral, resp. rate 14, height 5\' 3"  (1.6 m), weight 75 kg, SpO2 98%. Physical Exam Vitals reviewed.  Constitutional:      Appearance: She is well-developed.  HENT:     Head: Normocephalic and atraumatic.  Eyes:     Extraocular Movements: Extraocular movements intact.     Pupils: Pupils are equal, round, and reactive  to light.  Cardiovascular:     Rate and Rhythm: Normal rate and regular rhythm.  Pulmonary:     Effort: Pulmonary effort is normal.     Breath sounds: Normal breath sounds.  Abdominal:     Comments: Abdomen soft, nondistended, no percussion tenderness, nontender to palpation; no rigidity, guarding, rebound tenderness; negative Murphy sign  Musculoskeletal:        General: Normal range of motion.     Cervical back: Normal range of motion.  Skin:    General: Skin is warm and dry.  Neurological:     General: No focal deficit present.     Mental Status: She is alert and oriented to person, place, and time.  Psychiatric:        Mood and Affect: Mood normal.        Behavior:  Behavior normal.     Results: Results for orders placed or performed during the hospital encounter of 03/24/24 (from the past 48 hours)  Basic metabolic panel     Status: None   Collection Time: 03/24/24  1:07 PM  Result Value Ref Range   Sodium 139 135 - 145 mmol/L   Potassium 4.0 3.5 - 5.1 mmol/L   Chloride 106 98 - 111 mmol/L   CO2 25 22 - 32 mmol/L   Glucose, Bld 93 70 - 99 mg/dL    Comment: Glucose reference range applies only to samples taken after fasting for at least 8 hours.   BUN 16 6 - 20 mg/dL   Creatinine, Ser 1.61 0.44 - 1.00 mg/dL   Calcium  8.9 8.9 - 10.3 mg/dL   GFR, Estimated >09 >60 mL/min    Comment: (NOTE) Calculated using the CKD-EPI Creatinine Equation (2021)    Anion gap 8 5 - 15    Comment: Performed at [facility], [address] [place], [place] [address]  CBC     Status: None   Collection Time: 03/24/24  1:07 PM  Result Value Ref Range   WBC 9.7 4.0 - 10.5 K/uL   RBC 4.37 3.87 - 5.11 MIL/uL   Hemoglobin 13.8 12.0 - 15.0 g/dL   HCT 81.1 91.4 - 78.2 %   MCV 92.2 80.0 - 100.0 fL   MCH 31.6 26.0 - 34.0 pg   MCHC 34.2 30.0 - 36.0 g/dL   RDW 95.6 21.3 - 08.6 %   Platelets 285 150 - 400 K/uL   nRBC 0.0 0.0 - 0.2 %    Comment: Performed at [facility], [address], [place], [place] [address]  Troponin I (High Sensitivity)     Status: None   Collection Time: 03/24/24  1:07 PM  Result Value Ref Range   Troponin I (High Sensitivity) 3 <18 ng/L    Comment: (NOTE) Elevated high sensitivity troponin I (hsTnI) values and significant  changes across serial measurements may suggest ACS but many other  chronic and acute conditions are known to elevate hsTnI results.  Refer to the "Links" section for chest pain algorithms and additional  guidance. Performed at [facility], [address] [place], [place] [address]   Hepatic function panel     Status: None   Collection Time: 03/24/24  1:07 PM  Result Value Ref Range   Total Protein 7.0 6.5 - 8.1 g/dL    Albumin 3.8 3.5 - 5.0 g/dL   AST 33 15 - 41 U/L   ALT 38 0 - 44 U/L   Alkaline Phosphatase 73 38 - 126 U/L   Total  Bilirubin 0.9 0.0 - 1.2 mg/dL   Bilirubin, Direct 0.2 0.0 - 0.2 mg/dL   Indirect Bilirubin 0.7 0.3 - 0.9 mg/dL    Comment: Performed at [facility], [address], [place], [place] [address]  D-dimer, quantitative     Status: None   Collection Time: 03/24/24  1:07 PM  Result Value Ref Range   D-Dimer, Quant <0.27 0.00 - 0.50 ug/mL-FEU    Comment: (NOTE) At the manufacturer cut-off value of 0.5 g/mL FEU, this assay has a negative predictive value of 95-100%.This assay is intended for use in conjunction with a clinical pretest probability (PTP) assessment model to exclude pulmonary embolism (PE) and deep venous thrombosis (DVT) in outpatients suspected of PE or DVT. Results should be correlated with clinical presentation. Performed at [facility], [address] [place], [place] [address]   Lipase, blood     Status: Abnormal   Collection Time: 03/24/24  1:07 PM  Result Value Ref Range   Lipase 270 (H) 11 - 51 U/L    Comment: Performed at [facility], [address] [place], [place] [address]  Troponin I (High Sensitivity)     Status: None   Collection Time: 03/24/24  4:14 PM  Result Value Ref Range   Troponin I (High Sensitivity) 3 <18 ng/L    Comment: (NOTE) Elevated high sensitivity troponin I (hsTnI) values and significant  changes across serial measurements may suggest ACS but many other  chronic and acute conditions are known to elevate hsTnI results.  Refer to the "Links" section for chest pain algorithms and additional  guidance. Performed at [facility], [address], [place], [place] [address]   Comprehensive metabolic panel     Status: Abnormal   Collection Time: 03/25/24  2:33 AM  Result Value Ref Range   Sodium 140 135 - 145 mmol/L   Potassium 3.8 3.5 - 5.1 mmol/L   Chloride 106 98 - 111 mmol/L   CO2 25 22 - 32 mmol/L   Glucose, Bld 85 70 -  99 mg/dL    Comment: Glucose reference range applies only to samples taken after fasting for at least 8 hours.   BUN 11 6 - 20 mg/dL   Creatinine, Ser 2.95 0.44 - 1.00 mg/dL   Calcium  8.1 (L) 8.9 - 10.3 mg/dL   Total Protein 6.0 (L) 6.5 - 8.1 g/dL   Albumin 3.2 (L) 3.5 - 5.0 g/dL   AST 38 15 - 41 U/L   ALT 62 (H) 0 - 44 U/L   Alkaline Phosphatase 67 38 - 126 U/L   Total Bilirubin 0.7 0.0 - 1.2 mg/dL   GFR, Estimated >62 >13 mL/min    Comment: (NOTE) Calculated using the CKD-EPI Creatinine Equation (2021)    Anion gap 9 5 - 15    Comment: Performed at [facility], [address], [place], [place] [address]  CBC     Status: None   Collection Time: 03/25/24  2:33 AM  Result Value Ref Range   WBC 6.3 4.0 - 10.5 K/uL   RBC 3.99 3.87 - 5.11 MIL/uL   Hemoglobin 12.5 12.0 - 15.0 g/dL   HCT 84.6 96.2 - 95.2 %   MCV 93.2 80.0 - 100.0 fL   MCH 31.3 26.0 - 34.0 pg   MCHC 33.6 30.0 - 36.0 g/dL   RDW 84.1 32.4 - 40.1 %   Platelets 265 150 - 400 K/uL   nRBC 0.0 0.0 - 0.2 %    Comment: Performed at [facility], [address], [place], [place] [address]  Lipase, blood     Status: None   Collection Time: 03/25/24  2:33 AM  Result Value Ref Range   Lipase 45 11 - 51 U/L    Comment: Performed at [facility], [address], [place], [place] [address]  HIV Antibody (routine testing w rflx)     Status: None   Collection Time: 03/25/24  2:33 AM  Result Value Ref Range   HIV Screen 4th Generation wRfx Non Reactive Non Reactive    Comment: Performed at [facility], [address], [place], [place] [address]  Glucose, capillary     Status: Abnormal   Collection Time: 03/25/24  8:47 PM  Result Value Ref Range   Glucose-Capillary 110 (H) 70 - 99 mg/dL    Comment: Glucose reference range applies only to samples taken after fasting for at least 8 hours.   Comment 1 Notify RN    Comment 2 Document in Chart   Glucose, capillary     Status: None   Collection Time: 03/25/24 11:52 PM  Result  Value Ref Range   Glucose-Capillary 84 70 - 99 mg/dL    Comment: Glucose reference range applies only to samples taken after fasting for at least 8 hours.    US  Abdomen Limited RUQ (LIVER/GB) Result Date: 03/25/2024 CLINICAL DATA:  Acute pancreatitis EXAM: ULTRASOUND ABDOMEN LIMITED RIGHT UPPER QUADRANT COMPARISON:  CT abdomen and pelvis 03/24/2024 FINDINGS: Gallbladder: Gallstones are present measuring up to 1 cm. There is no gallbladder wall thickening or pericholecystic fluid. Sonographic Abigail Abler sign is negative. Common bile duct: Diameter: 5 mm Liver: No focal lesion identified. Within normal limits in parenchymal echogenicity. Portal vein is patent on color Doppler imaging with normal direction of blood flow towards the liver. Other: None. IMPRESSION: Cholelithiasis without sonographic evidence of acute cholecystitis. Electronically Signed   By: Tyron Gallon M.D.   On: 03/25/2024 15:55   CT ABDOMEN PELVIS W CONTRAST Result Date: 03/24/2024 CLINICAL DATA:  Epigastric pain EXAM: CT ABDOMEN AND PELVIS WITH CONTRAST TECHNIQUE: Multidetector CT imaging of the abdomen and pelvis was performed using the standard protocol following bolus administration of intravenous contrast. RADIATION DOSE REDUCTION: This exam was performed according to the departmental dose-optimization program which includes automated exposure control, adjustment of the mA and/or kV according to patient size and/or use of iterative reconstruction technique. CONTRAST:  OMNIPAQUE IOHEXOL 300 MG/ML  SOLN COMPARISON:  December 20, 2016 FINDINGS: Lower chest: No focal airspace consolidation or pleural effusion. Hepatobiliary: No mass.No radiopaque stones or wall thickening of the gallbladder. No intrahepatic or extrahepatic biliary ductal dilation. The portal veins are patent. Pancreas: No mass or main ductal dilation. No peripancreatic inflammation or fluid collection. Spleen: Normal size. No mass. Adrenals/Urinary Tract: No adrenal  masses. Subcentimeter hypodensity noted in the right kidney, too small to definitively characterize, but likely a small cyst. No nephrolithiasis or hydronephrosis. The urinary bladder is distended without focal abnormality. Stomach/Bowel: The stomach is decompressed without focal abnormality. No small bowel wall thickening or inflammation. No small bowel obstruction.Normal appendix. Vascular/Lymphatic: No aortic aneurysm. No intraabdominal or pelvic lymphadenopathy. Reproductive: Age-related atrophy of the uterus and ovaries. No concerning adnexal mass.No free pelvic fluid. Other: No pneumoperitoneum, ascites, or mesenteric inflammation. Musculoskeletal: No acute fracture or destructive lesion. IMPRESSION: No acute intraabdominal or pelvic abnormality. Electronically Signed   By: Rance Burrows M.D.   On: 03/24/2024 15:45   DG Chest 2 View Result Date: 03/24/2024 CLINICAL DATA:  Chest pain EXAM: CHEST - 2 VIEW COMPARISON:  08/21/2023 FINDINGS: The lungs are clear without focal pneumonia, edema, pneumothorax or pleural effusion. The cardiopericardial silhouette is within normal limits for size. No acute bony abnormality. Telemetry leads overlie the chest. IMPRESSION: No active cardiopulmonary disease. Electronically Signed   By: Donnal Fusi M.D.   On: 03/24/2024 13:37     Assessment & Plan:  Mary [name] is a 58 y.o. female who was admitted with pancreatitis.  Abdominal ultrasound demonstrated cholelithiasis without evidence of cholecystitis, concerning for possible gallstone pancreatitis.  -We discussed the pathophysiology of gallbladder disease and pancreatitis.  We discussed that while her LFTs were normal, one of the most common causes of pancreatitis is cholelithiasis.  The recommendation when there is concern for gallstone pancreatitis is removal of the gallbladder.   -Discussed that given the patient's stability we could proceed with surgery inpatient versus have her follow-up outpatient  for cholecystectomy.  Patient expresses that she would like cholecystectomy while inpatient. -I counseled the patient about the indication, risks and benefits of robotic assisted laparoscopic cholecystectomy.  She understands there is a very small chance for bleeding, infection, injury to normal structures (including common bile duct), conversion to open surgery, persistent symptoms, evolution of postcholecystectomy diarrhea, need for secondary interventions, anesthesia reaction, cardiopulmonary issues and other risks not specifically detailed here. I described the expected recovery, the plan for follow-up and the restrictions during the recovery phase.  All questions were answered. -NPO preoperatively -Cefotan ordered prophylactically -IVF per primary team -PRN pain control and antiemetics -Plan for surgery today -Further recommendations to follow surgery.  If patient is tolerating a diet without nausea and vomiting, she will be stable for discharge from surgical standpoint -Appreciate hospitalist recommendations  All questions were answered to the satisfaction of the patient and family.  Note: Portions of this report may have been transcribed using voice recognition software. Every effort has been made to ensure accuracy; however, inadvertent computerized transcription errors may still be present.   -- Lidia Reels, DO [facility] [address] [place], [place] [address] [phone] (office)

## 2024-03-26 NOTE — Plan of Care (Signed)

## 2024-03-26 NOTE — Anesthesia Procedure Notes (Signed)
 Procedure Name: Intubation Date/Time: 03/26/2024 9:26 AM  Performed by: Verline Glow, CRNAPre-anesthesia Checklist: Patient identified, Patient being monitored, Timeout performed, Emergency Drugs available and Suction available Patient Re-evaluated:Patient Re-evaluated prior to induction Oxygen Delivery Method: Circle system utilized Preoxygenation: Pre-oxygenation with 100% oxygen Induction Type: IV induction Ventilation: Mask ventilation without difficulty Laryngoscope Size: Mac and 3 Grade View: Grade I Tube type: Oral Tube size: 7.0 mm Number of attempts: 1 Airway Equipment and Method: Stylet Placement Confirmation: ETT inserted through vocal cords under direct vision, positive ETCO2 and breath sounds checked- equal and bilateral Secured at: 21 cm Tube secured with: Tape Dental Injury: Teeth and Oropharynx as per pre-operative assessment

## 2024-03-26 NOTE — Progress Notes (Signed)
 [facility]  Spoke with the patient's husband in the consultation room.  I explained that she tolerated the procedure without difficulty.  She has dissolvable stitches under the skin with overlying skin glue.  This will flake off in 10 to 14 days.  I sent in a prescription for narcotic pain medication that they should take as needed for pain.  I also want her taking scheduled Tylenol .  If they take the narcotic pain medication, they should take a stool softener as well.  The patient will follow-up with me in 2-3 weeks for phone follow-up.  If she tolerates a diet and pain is controlled today, she will be stable for discharge home.  All questions were answered to his expressed satisfaction.  Plan: -Patient will return to room on the floor -Regular diet -PRN pain control and antiemetics -No need for further antibiotics -If patient tolerates diet without nausea and vomiting and pain is controlled with oral medications, she will be stable for discharge home from a general surgery standpoint -Appreciate hospitalist recommendations  Lidia Reels, DO [facility] [address] [place], [place] [address] [phone] (office)

## 2024-03-26 NOTE — Discharge Instructions (Signed)
Ambulatory Surgery Discharge Instructions  General Anesthesia or Sedation Do not drive or operate heavy machinery for 24 hours.  Do not consume alcohol, tranquilizers, sleeping medications, or any non-prescribed medications for 24 hours. Do not make important decisions or sign any important papers in the next 24 hours. You should have someone with you tonight at home.  Activity  You are advised to go directly home from the hospital.  Restrict your activities and rest for a day.  Resume light activity tomorrow. No heavy lifting over 10 lbs or strenuous exercise.  Fluids and Diet Regular diet  Medications  If you have not had a bowel movement in 24 hours, take 2 tablespoons over the counter Milk of mag.             You May resume your blood thinners tomorrow (Aspirin, coumadin, or other).  You are being discharged with prescriptions for Opioid/Narcotic Medications: There are some specific considerations for these medications that you should know. Opioid Meds have risks & benefits. Addiction to these meds is always a concern with prolonged use Take medication only as directed Do not drive while taking narcotic pain medication Do not crush tablets or capsules Do not use a different container than medication was dispensed in Lock the container of medication in a cool, dry place out of reach of children and pets. Opioid medication can cause addiction Do not share with anyone else (this is a felony) Do not store medications for future use. Dispose of them properly.     Disposal:  Find a [facility] household drug take back site near you.  If you can't get to a drug take back site, use the recipe below as a last resort to dispose of expired, unused or unwanted drugs. Disposal  (Do not dispose chemotherapy drugs this way, talk to your prescribing doctor instead.) Step 1: Mix drugs (do not crush) with dirt, kitty litter, or used coffee grounds and add a small amount of water to dissolve any  solid medications. Step 2: Seal drugs in plastic bag. Step 3: Place plastic bag in trash. Step 4: Take prescription container and scratch out personal information, then recycle or throw away.  Operative Site  You have a liquid bandage over your incisions, this will begin to flake off in about a week. Ok to shower tomorrow. Keep wound clean and dry. No baths or swimming. No lifting more than 10 pounds.  Contact Information: If you have questions or concerns, please call our office, [phone], Monday- Thursday 8AM-5PM and Friday 8AM-12Noon.  If it is after hours or on the weekend, please call [facility] Main Number, [phone], and ask to speak to the surgeon on call for Dr. Brittnee Gaetano at [place].   SPECIFIC COMPLICATIONS TO WATCH FOR: Inability to urinate Fever over 101? F by mouth Nausea and vomiting lasting longer than 24 hours. Pain not relieved by medication ordered Swelling around the operative site Increased redness, warmth, hardness, around operative area Numbness, tingling, or cold fingers or toes Blood -soaked dressing, (small amounts of oozing may be normal) Increasing and progressive drainage from surgical area or exam site  

## 2024-03-26 NOTE — Transfer of Care (Signed)
 Immediate Anesthesia Transfer of Care Note  Patient: Mary [name]  Procedure(s) Performed: CHOLECYSTECTOMY, ROBOT-ASSISTED, LAPAROSCOPIC (Abdomen)  Patient Location: PACU  Anesthesia Type:General  Level of Consciousness: awake and patient cooperative  Airway & Oxygen Therapy: Patient Spontanous Breathing and Patient connected to nasal cannula oxygen  Post-op Assessment: Report given to RN and Post -op Vital signs reviewed and stable  Post vital signs: Reviewed and stable  Last Vitals:  Vitals Value Taken Time  BP 121/70 03/26/24 1057  Temp 98.4 03/26/2024  1057  Pulse 86 03/26/24 1056  Resp 18 03/26/24 1056  SpO2 86 % 03/26/24 1056  Vitals shown include unfiled device data.  Last Pain:  Vitals:   03/26/24 0834  TempSrc: Oral  PainSc: 3       Patients Stated Pain Goal: 4 (03/26/24 0834)  Complications: No notable events documented.

## 2024-03-27 ENCOUNTER — Ambulatory Visit: Payer: Self-pay

## 2024-03-27 ENCOUNTER — Telehealth: Payer: Self-pay

## 2024-03-27 ENCOUNTER — Telehealth: Admitting: Family Medicine

## 2024-03-27 DIAGNOSIS — M62838 Other muscle spasm: Secondary | ICD-10-CM

## 2024-03-27 LAB — SURGICAL PATHOLOGY

## 2024-03-27 MED ORDER — BACLOFEN 10 MG PO TABS: 5.0000 mg | ORAL_TABLET | Freq: Three times a day (TID) | ORAL | 0 refills | Status: DC | PRN

## 2024-03-27 NOTE — Transitions of Care (Post Inpatient/ED Visit) (Signed)
 03/27/2024  Name: Mary [name] MRN: [id number] DOB: 09/09/1966  Today's TOC FU Call Status: Today's TOC FU Call Status:: Successful TOC FU Call Completed TOC FU Call Complete Date: 03/27/24 Patient's Name and Date of Birth confirmed.  Transition Care Management Follow-up Telephone Call Date of Discharge: 03/26/24 Discharge Facility: [facility] Type of Discharge: Inpatient Admission Primary Inpatient Discharge Diagnosis:: pancreatitis How have you been since you were released from the hospital?: Better Any questions or concerns?: No  Items Reviewed: Did you receive and understand the discharge instructions provided?: Yes Medications obtained,verified, and reconciled?: Yes (Medications Reviewed) Any new allergies since your discharge?: No Dietary orders reviewed?: Yes Do you have support at home?: Yes People in Home [RPT]: parent(s)  Medications Reviewed Today: Medications Reviewed Today     Reviewed by Darrall Ellison, LPN (Licensed Practical Nurse) on 03/27/24 at 1150  Med List Status: <None>   Medication Order Taking? Sig Documenting Provider Last Dose Status Informant  acetaminophen  (TYLENOL ) 500 MG tablet [id number]  Take 2 tablets (1,000 mg total) by mouth every 6 (six) hours for 7 days. Pappayliou, Arie Begin, DO  Active   amoxicillin  (AMOXIL ) 875 MG tablet [id number] No Take 875 mg by mouth 2 (two) times daily. [provider] 03/17/2024 Active Self  b complex vitamins capsule [id number] No Take 1 capsule by mouth daily. [provider] 03/24/2024 Morning Active Self  busPIRone  (BUSPAR ) 10 MG tablet [id number] No Take 1 tablet (10 mg total) by mouth 2 (two) times daily. Mozingo, Regina Nattalie, NP 03/23/2024 Bedtime Active Self  docusate sodium (COLACE) 100 MG capsule [id number]  Take 1 capsule (100 mg total) by mouth 2 (two) times daily. Pappayliou, Arie Begin, DO  Active   estradiol  (ESTRACE ) 0.1 MG/GM vaginal cream [id number] No Place 1  Applicatorful vaginally daily as needed (irritation, pain). [provider] Past Week Active Self  ferrous sulfate 325 (65 FE) MG tablet [id number] No Take 325 mg by mouth daily with breakfast. [provider] Past Week Active Self  FIBER COMPLETE PO [id number] No Take 1 capsule by mouth daily. Omnifiber [provider] Past Week Active Self  FLUoxetine  (PROZAC ) 20 MG capsule [id number] No Take 1 capsule by mouth once daily Mozingo, Regina Nattalie, NP 03/23/2024 Bedtime Active Self  ibuprofen  (ADVIL ) 200 MG tablet [id number] No Take 400 mg by mouth every 6 (six) hours as needed for mild pain (pain score 1-3). [provider] 03/24/2024 Morning Active Self  lamoTRIgine  (LAMICTAL ) 100 MG tablet [id number] No Take 1 tablet by mouth once daily Mozingo, Regina Nattalie, NP 03/23/2024 Bedtime Active Self  LORazepam  (ATIVAN ) 0.5 MG tablet [id number] No Take 1 tablet (0.5 mg total) by mouth 2 (two) times daily as needed.  Patient taking differently: Take 0.5 mg by mouth 2 (two) times daily as needed for anxiety or sleep.   Mozingo, Regina Nattalie, NP Past Month Active Self  nortriptyline  (PAMELOR ) 10 MG capsule [id number] No Take 1 capsule by mouth at bedtime Cook, Jayce G, DO 03/23/2024 Bedtime Active Self  NP THYROID  15 MG tablet [id number] No Take 15 mg by mouth daily. 15 mg and 60 mg [provider] 03/24/2024 Morning Active Self  ondansetron  (ZOFRAN ) 4 MG tablet [id number]  Take 1 tablet (4 mg total) by mouth daily as needed for nausea or vomiting. Pappayliou, Arie Begin DO  Active   OVER THE COUNTER MEDICATION [id number] No Apply 1 Application topically daily as needed (pain). Magnesium rub [provider] 03/23/2024 Morning Active Self  oxyCODONE  (  ROXICODONE ) 5 MG immediate release tablet [id number]  Take 1 tablet (5 mg total) by mouth every 6 (six) hours as needed. Pappayliou, Arie Begin, DO  Active   progesterone  (PROMETRIUM ) 200 MG capsule [id number] No Take 200  mg by mouth at bedtime. [provider] 03/23/2024 Bedtime Active Self  testosterone  cypionate (DEPOTESTOSTERONE CYPIONATE) 200 MG/ML injection [id number] No Inject 200 mg into the muscle once a week. [provider] 03/23/2024 Morning Active Self  thyroid  (NP THYROID ) 60 MG tablet [id number] No Take 60 mg by mouth daily before breakfast. 15 mg and 60 mg [provider] 03/24/2024 Morning Active Self  Turmeric (QC TUMERIC COMPLEX PO) [id number] No Take 1 capsule by mouth daily. [provider] 03/24/2024 Noon Active Self  valACYclovir  (VALTREX ) 500 MG tablet [id number]  Take 1 tablet (500 mg total) by mouth every 12 (twelve) hours as needed. Justina Oman, MD  Active   VITAMIN D PO [id number] No Take 7,500 Units by mouth daily. [provider] 03/24/2024 Morning Active Self            Home Care and Equipment/Supplies: Were Home Health Services Ordered?: NA Any new equipment or medical supplies ordered?: NA  Functional Questionnaire: Do you need assistance with bathing/showering or dressing?: No Do you need assistance with meal preparation?: No Do you need assistance with eating?: No Do you have difficulty maintaining continence: No Do you need assistance with getting out of bed/getting out of a chair/moving?: No Do you have difficulty managing or taking your medications?: No  Follow up appointments reviewed: PCP Follow-up appointment confirmed?: Yes Date of PCP follow-up appointment?: 03/29/24 Follow-up Provider: [facility] Follow-up appointment confirmed?: NA Do you need transportation to your follow-up appointment?: No Do you understand care options if your condition(s) worsen?: Yes-patient verbalized understanding    SIGNATURE Darrall Ellison, LPN [facility] Nurse Health Advisor Direct Dial [phone]

## 2024-03-27 NOTE — Progress Notes (Signed)
 Virtual Visit via Video Note  I connected with Mary [name] on 03/27/24 at  2:50 PM EDT by a video enabled telemedicine application and verified that I am speaking with the correct person using two identifiers.  Location: Patient: Home Provider: Office   I discussed the limitations of evaluation and management by telemedicine and the availability of in person appointments. The patient expressed understanding and agreed to proceed.  History of Present Illness:  58 year old female who was recently admitted to the hospital for gallstone pancreatitis presents for evaluation of neck pain.  Started yesterday.  She reports neck pain and localizes the pain to the trapezius.  Pain is predominately to the right side.  She has been resting and using her medication.  Area feels tight.  No radicular symptoms.   Observations/Objective: General: Well-appearing female in no acute distress. Respiratory: Speaking in full sentences.  No respiratory distress.  Assessment and Plan: Musculoskeletal pain/trapezius muscle spasm - Treating with baclofen .  Advised heat.  Supportive care.  Follow Up Instructions:    I discussed the assessment and treatment plan with the patient. The patient was provided an opportunity to ask questions and all were answered. The patient agreed with the plan and demonstrated an understanding of the instructions.   The patient was advised to call back or seek an in-person evaluation if the symptoms worsen or if the condition fails to improve as anticipated.  I provided 5 minutes of non-face-to-face time during this encounter.   Marcel Sorter G Leotta Weingarten, DO

## 2024-03-27 NOTE — Telephone Encounter (Signed)
 FYI Only or Action Required?: Action required by provider  Patient was last seen in primary care on 08/30/2023 by Derenda Flax, NP. Called Nurse Triage reporting Neck Pain. Symptoms began yesterday. Interventions attempted: OxycodonePrescription medications:   and Rest, hydration, or home remedies. Symptoms are: Severe pain right side of neck and shoulder.unchanged.  Triage Disposition: See HCP Within 4 Hours (Or PCP Triage) Requests virtual visit to surgery yesterday. Patient/caregiver understands and will follow disposition?: Yes           Copied from CRM [id number]. Topic: Clinical - Red Word Triage >> Mar 27, 2024 12:50 PM DeAngela L wrote: Red Word that prompted transfer to Nurse Triage: patient have really bad pain on the right side of her neck in the muscle of her shoulder and it hurts to breath the patient just gall bladder surgery yesterday  Pt num [phone] (M) Reason for Disposition  [1] SEVERE neck pain (e.g., excruciating, unable to do any normal activities) AND [2] not improved after 2 hours of pain medicine  Answer Assessment - Initial Assessment Questions 1. ONSET: "When did the pain begin?"      Yesterday 2. LOCATION: "Where does it hurt?"      Right side, muscle 3. PATTERN "Does the pain come and go, or has it been constant since it started?"      Comes and goes 4. SEVERITY: "How bad is the pain?"  (Scale 1-10; or mild, moderate, severe)   - NO PAIN (0): no pain or only slight stiffness    - MILD (1-3): doesn't interfere with normal activities    - MODERATE (4-7): interferes with normal activities or awakens from sleep    - SEVERE (8-10):  excruciating pain, unable to do any normal activities      severe 5. RADIATION: "Does the pain go anywhere else, shoot into your arms?"     Shoulder 6. CORD SYMPTOMS: "Any weakness or numbness of the arms or legs?"     no 7. CAUSE: "What do you think is causing the neck pain?"     Had surgery yesterday 8. NECK  OVERUSE: "Any recent activities that involved turning or twisting the neck?"     no 9. OTHER SYMPTOMS: "Do you have any other symptoms?" (e.g., headache, fever, chest pain, difficulty breathing, neck swelling)     no 10. PREGNANCY: "Is there any chance you are pregnant?" "When was your last menstrual period?"       no  Protocols used: Neck Pain or Stiffness-A-AH

## 2024-03-28 NOTE — Anesthesia Postprocedure Evaluation (Signed)
 Anesthesia Post Note  Patient: Mary [name]  Procedure(s) Performed: CHOLECYSTECTOMY, ROBOT-ASSISTED, LAPAROSCOPIC (Abdomen)  Patient location during evaluation: Phase II Anesthesia Type: General Level of consciousness: awake Pain management: pain level controlled Vital Signs Assessment: post-procedure vital signs reviewed and stable Respiratory status: spontaneous breathing and respiratory function stable Cardiovascular status: blood pressure returned to baseline and stable Postop Assessment: no headache and no apparent nausea or vomiting Anesthetic complications: no Comments: Late entry   No notable events documented.   Last Vitals:  Vitals:   03/26/24 1215 03/26/24 1249  BP: 103/63 107/65  Pulse: 73 79  Resp: 12 16  Temp:  36.8 C  SpO2: 91% 94%    Last Pain:  Vitals:   03/26/24 1316  TempSrc:   PainSc: Asleep                 Coretha Dew

## 2024-03-29 ENCOUNTER — Encounter: Payer: Self-pay | Admitting: Family Medicine

## 2024-03-29 ENCOUNTER — Ambulatory Visit: Admitting: Family Medicine

## 2024-03-29 VITALS — BP 114/82 | HR 89 | Temp 97.9°F | Ht 63.0 in | Wt 167.0 lb

## 2024-03-29 DIAGNOSIS — M797 Fibromyalgia: Secondary | ICD-10-CM | POA: Diagnosis not present

## 2024-03-29 DIAGNOSIS — K859 Acute pancreatitis without necrosis or infection, unspecified: Secondary | ICD-10-CM | POA: Diagnosis not present

## 2024-03-29 MED ORDER — NORTRIPTYLINE HCL 10 MG PO CAPS: 10.0000 mg | ORAL_CAPSULE | Freq: Every day | ORAL | 3 refills | Status: DC

## 2024-03-29 MED ORDER — BACLOFEN 10 MG PO TABS: 5.0000 mg | ORAL_TABLET | Freq: Three times a day (TID) | ORAL | 3 refills | Status: DC | PRN

## 2024-03-29 NOTE — Patient Instructions (Signed)
 Medications sent in.  Follow up in 6 months.

## 2024-04-01 NOTE — Assessment & Plan Note (Signed)
Resolved. Doing well.

## 2024-04-01 NOTE — Assessment & Plan Note (Signed)
 Stable.  Baclofen  and nortriptyline  refilled.

## 2024-04-01 NOTE — Progress Notes (Signed)
 Subjective:  Patient ID: Mary [name], female    DOB: November 08, 1965  Age: 58 y.o. MRN: [id number]  CC:   Chief Complaint  Patient presents with   Hospitalization Follow-up    Gallbladder removed tuesday - pancreatitis    HPI:  58 year old female presents for hospital follow-up.  Patient presented with abdominal pain.  Found to have gallstone pancreatitis.  Had cholecystectomy.  Patient is overall doing well at this time.  She still has some abdominal soreness from surgery.  Eating well.  Incisions healing well.  Patient with recent trapezius muscle spasm.  Patient states that baclofen  has helped significantly.  She would like a refill.  Patient still needs a refill on nortriptyline .  Patient Active Problem List   Diagnosis Date Noted   Acute pancreatitis 03/24/2024   Irregular heart beat 08/31/2023   Asymptomatic PVCs 08/31/2023   Genital herpes 05/13/2020   Social anxiety disorder 02/26/2020   Bipolar 2 disorder (HCC) 01/09/2020   Anxiety 01/09/2020   Mixed hyperlipidemia 02/14/2019   Hypothyroidism 09/26/2018   Fibromyalgia affecting multiple sites 05/28/2014    Social Hx   Social History   Socioeconomic History   Marital status: Married    Spouse name: Not on file   Number of children: Not on file   Years of education: Not on file   Highest education level: 12th grade  Occupational History   Not on file  Tobacco Use   Smoking status: Former    Types: Cigarettes   Smokeless tobacco: Never   Tobacco comments:    quit feb 2016  Vaping Use   Vaping status: Never Used  Substance and Sexual Activity   Alcohol use: No    Alcohol/week: 0.0 standard drinks of alcohol   Drug use: No   Sexual activity: Yes    Birth control/protection: Surgical  Other Topics Concern   Not on file  Social History Narrative   Married since 1998.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   Social Drivers of Health   Financial Resource  Strain: Medium Risk (03/06/2023)   Overall Financial Resource Strain (CARDIA)    Difficulty of Paying Living Expenses: Somewhat hard  Food Insecurity: No Food Insecurity (03/24/2024)   Hunger Vital Sign    Worried About Running Out of Food in the Last Year: Never true    Ran Out of Food in the Last Year: Never true  Transportation Needs: No Transportation Needs (03/24/2024)   PRAPARE - Administrator, Civil Service (Medical): No    Lack of Transportation (Non-Medical): No  Physical Activity: Insufficiently Active (03/06/2023)   Exercise Vital Sign    Days of Exercise per Week: 2 days    Minutes of Exercise per Session: 20 min  Stress: Stress Concern Present (03/06/2023)   [employer] of Occupational Health - Occupational Stress Questionnaire    Feeling of Stress : To some extent  Social Connections: Socially Integrated (03/24/2024)   Social Connection and Isolation Panel [NHANES]    Frequency of Communication with Friends and Family: More than three times a week    Frequency of Social Gatherings with Friends and Family: Twice a week    Attends Religious Services: More than 4 times per year    Active Member of [employer] or Organizations: Yes    Attends Engineer, structural: More than 4 times per year    Marital Status: Married    Review of Systems Per HPI  Objective:  BP 114/82   Pulse  89   Temp 97.9 F (36.6 C)   Ht 5\' 3"  (1.6 m)   Wt 167 lb (75.8 kg)   SpO2 98%   BMI 29.58 kg/m      03/29/2024    9:05 AM 03/26/2024   12:49 PM 03/26/2024   12:15 PM  BP/Weight  Systolic BP 114 107 103  Diastolic BP 82 65 63  Wt. (Lbs) 167    BMI 29.58 kg/m2      Physical Exam Constitutional:      General: She is not in acute distress.    Appearance: Normal appearance.  HENT:     Head: Normocephalic and atraumatic.  Cardiovascular:     Rate and Rhythm: Normal rate and regular rhythm.  Pulmonary:     Effort: Pulmonary effort is normal.     Breath sounds: Normal  breath sounds.  Abdominal:     General: There is no distension.     Palpations: Abdomen is soft.     Comments: Incision sites healing well.  Neurological:     Mental Status: She is alert.  Psychiatric:        Mood and Affect: Mood normal.        Behavior: Behavior normal.     Lab Results  Component Value Date   WBC 6.3 03/25/2024   HGB 12.5 03/25/2024   HCT 37.2 03/25/2024   PLT 265 03/25/2024   GLUCOSE 85 03/25/2024   CHOL 208 (H) 01/10/2023   TRIG 157 (H) 01/10/2023   HDL 47 01/10/2023   LDLCALC 133 (H) 01/10/2023   ALT 62 (H) 03/25/2024   AST 38 03/25/2024   NA 140 03/25/2024   K 3.8 03/25/2024   CL 106 03/25/2024   CREATININE 0.80 03/25/2024   BUN 11 03/25/2024   CO2 25 03/25/2024   TSH 0.071 (L) 01/10/2023   HGBA1C 5.4 01/27/2021     Assessment & Plan:  Acute pancreatitis without infection or necrosis, unspecified pancreatitis type Assessment & Plan: Resolved.  Doing well.   Fibromyalgia affecting multiple sites Assessment & Plan: Stable.  Baclofen  and nortriptyline  refilled.  Orders: -     Nortriptyline  HCl; Take 1 capsule (10 mg total) by mouth at bedtime.  Dispense: 90 capsule; Refill: 3 -     Baclofen ; Take 0.5-1 tablets (5-10 mg total) by mouth 3 (three) times daily as needed for muscle spasms.  Dispense: 90 each; Refill: 3    Follow-up:  6 months  Normajean Nash Debrah Fan DO [facility]

## 2024-04-03 ENCOUNTER — Telehealth: Payer: Self-pay | Admitting: *Deleted

## 2024-04-03 NOTE — Telephone Encounter (Signed)
 Surgical Date: 03/26/2024 Procedure: XI ROBOTIC ASSISTED LAPAROSCOPIC CHOLECYSTECTOMY  Received call from patient [phone].  Patient reports that she has known chronic constipation. States that she has only had x1 BM since surgery. Reports that BM was passed this AM. States that BM noted hard and she had to push and strain to pass stool. States that she has been taking Colace and Senna.   Of note, patient has also reported increased nausea and not eating more than crackers and Sprite.   Advised to increase water  intake. Advised to use Miralax  2-3 x daily until BM are softer, then decrease as needed. Advised to add fiber to diet or supplement with Benefiber/ Metamucil.   Advised if constipation continues to discuss with PCP or GI.

## 2024-04-17 ENCOUNTER — Ambulatory Visit (INDEPENDENT_AMBULATORY_CARE_PROVIDER_SITE_OTHER): Admitting: Surgery

## 2024-04-17 DIAGNOSIS — Z09 Encounter for follow-up examination after completed treatment for conditions other than malignant neoplasm: Secondary | ICD-10-CM

## 2024-04-17 NOTE — Progress Notes (Signed)
 [facility]  I am calling the patient for post operative evaluation. This is not a billable encounter as it is under the global charges for the surgery.  The patient had a robotic assisted laparoscopic cholecystectomy on 6/3. The patient reports that she is doing well. She is tolerating a diet, having good pain control, and having regular Bms.  The incisions are healing well and the glue has all come off. The patient has no concerns.   Pathology: A. GALLBLADDER:  - Mild chronic cholecystitis.  - Cholelithiasis.  - No malignancy identified.   Will see the patient PRN.   Dorothyann Brittle, DO [facility] [address] [place], [place] [address] [phone] (office)

## 2024-05-07 ENCOUNTER — Ambulatory Visit: Payer: Self-pay

## 2024-05-07 NOTE — Telephone Encounter (Signed)
 FYI Only or Action Required?: FYI only for provider.  Patient was last seen in primary care on 03/29/2024 by Cook, Jayce G, DO.  Called Nurse Triage reporting Eye Pain.  Symptoms began today.  Interventions attempted: Nothing.  Symptoms are: stable.  Triage Disposition: Home Care  Patient/caregiver understands and will follow disposition?: Yes         Copied from CRM [id number]. Topic: Clinical - Red Word Triage >> May 07, 2024  3:17 PM Elle L wrote: Red Word that prompted transfer to Nurse Triage: The patient's husband has pink eye and she wants to know the possibility of her getting it. She is getting a stinging painful feeling in her left eye. Reason for Disposition  [1] MILD eye pain AND [2] present < 24 hours  Answer Assessment - Initial Assessment Questions 1. ONSET: When did the pain start? (e.g., minutes, hours, days)     30 minutes ago   2. TIMING: Does the pain come and go, or has it been constant since it started? (e.g., constant, intermittent, fleeting)     Come  3. SEVERITY: How bad is the pain?  (Scale 1-10; mild, moderate or severe)     mild 4. LOCATION: Where does it hurt?  (e.g., eyelid, eye, cheekbone)     Left eye 5. CAUSE: What do you think is causing the pain?     pinkeye 6. VISION: Do you have blurred vision or changes in your vision?      no 7. EYE DISCHARGE: Is there any discharge (pus) from the eye(s)?  If Yes, ask: What color is it?      Eye discharge  8. FEVER: Do you have a fever? If Yes, ask: What is it, how was it measured, and when did it start?      no 9. OTHER SYMPTOMS: Do you have any other symptoms? (e.g., headache, nasal discharge, facial rash)     no  Protocols used: Eye Pain and Other Symptoms-A-AH

## 2024-05-10 ENCOUNTER — Other Ambulatory Visit: Payer: Self-pay | Admitting: Family Medicine

## 2024-05-10 DIAGNOSIS — M797 Fibromyalgia: Secondary | ICD-10-CM

## 2024-06-13 ENCOUNTER — Ambulatory Visit: Admitting: Psychiatry

## 2024-06-17 ENCOUNTER — Ambulatory Visit (INDEPENDENT_AMBULATORY_CARE_PROVIDER_SITE_OTHER): Admitting: Psychiatry

## 2024-06-17 DIAGNOSIS — F331 Major depressive disorder, recurrent, moderate: Secondary | ICD-10-CM | POA: Diagnosis not present
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
## 2024-06-27 ENCOUNTER — Ambulatory Visit (INDEPENDENT_AMBULATORY_CARE_PROVIDER_SITE_OTHER): Admitting: Psychiatry

## 2024-06-27 DIAGNOSIS — F411 Generalized anxiety disorder: Secondary | ICD-10-CM | POA: Diagnosis not present

## 2024-06-27 NOTE — Progress Notes (Signed)
      [facility] Counselor/Therapist Progress Note  Patient ID: Mary [name], MRN: [id number],    Date: 06/27/2024  Time Spent: 53 minutes   Treatment Type: Individual Therapy  Reported Symptoms: anxiety, some depression, no self-harm, marital issues, easy to feel rejected by others, heightened emotions, tearfulness, stressed    Mental Status Exam:  Appearance:   Casual and Well Groomed     Behavior:  Appropriate, Sharing, and Motivated  Motor:  Normal  Speech/Language:   Clear and Coherent  Affect:  Depressed and anxious  Mood:  anxious and depressed  Thought process:  goal directed  Thought content:    WNL  Sensory/Perceptual disturbances:    WNL  Orientation:  oriented to person, place, time/date, situation, day of week, month of year, year, and stated date of Sept. 4, 2025  Attention:  Good  Concentration:  Good  Memory:  WNL  Fund of knowledge:   Good  Insight:    Good  Judgment:   Good  Impulse Control:  Good   Risk Assessment: Danger to Self:  No Self-injurious Behavior: No Danger to Others: No Duty to Warn:no Physical Aggression / Violence:No  Access to Firearms a concern: No  Gang Involvement:No   Subjective:   Patient today showing more anxiety versus depression however does struggle with some of both.  Talked further about concerns she has within the family.  Talked with patient at length about safety issues, her fearfulness at times, anxiety, depression, frustration, hurt, and some changes in shifts in her marital relationship.  Patient shared concerns and talked with her about her need for more specialized therapy that is directed more at some situations with which she is being confronted, but for patient privacy needs I am not documenting this in her note today as that information remains confidential.  I have given patient the contact information on a local agency that provides the type of therapy that is more needed by this patient at this point and  patient is in agreement.    Interventions: Cognitive Behavioral Therapy, Solution-Oriented/Positive Psychology, and Ego-Supportive 1.  Elevate mood and show evidence of usual energy, activities, and socialization level. 2.  Verbally express understanding of the relationship between depressed mood and repression of feelings such as anger, hurt, and sadness. 3.  Identify cognitive self talk that tends to support depression and work on changing the self talk to be more supportive and encouraging for patient.  Diagnosis:   ICD-10-CM   1. Generalized anxiety disorder  F41.1      Plan: As noted above, patient is being referred out for particular type of therapy offered at another office, based on changing needs of patient.  We discussed this at length today and patient is in agreement, and was provided with the contact information of the other practice.  Remains motivated.  Does realize she is in a very stressful situation and needs some particular skills and making good, healthy decisions and sticking with them so that eventually she can move in a more positive and healthier direction.  (Not all details included in this note due to patient privacy needs.)  Goal review and progress/challenges noted with patient.  Next appointment within 2 to 3 weeks.   Barnie Bunde, LCSW

## 2024-07-08 ENCOUNTER — Ambulatory Visit (INDEPENDENT_AMBULATORY_CARE_PROVIDER_SITE_OTHER): Admitting: Adult Health

## 2024-07-08 ENCOUNTER — Encounter: Payer: Self-pay | Admitting: Adult Health

## 2024-07-08 DIAGNOSIS — F4010 Social phobia, unspecified: Secondary | ICD-10-CM

## 2024-07-08 DIAGNOSIS — F331 Major depressive disorder, recurrent, moderate: Secondary | ICD-10-CM | POA: Diagnosis not present

## 2024-07-08 DIAGNOSIS — F411 Generalized anxiety disorder: Secondary | ICD-10-CM | POA: Diagnosis not present

## 2024-07-08 MED ORDER — FLUOXETINE HCL 20 MG PO CAPS: 20.0000 mg | ORAL_CAPSULE | Freq: Every day | ORAL | 1 refills | Status: AC

## 2024-07-08 MED ORDER — LAMOTRIGINE 100 MG PO TABS: 100.0000 mg | ORAL_TABLET | Freq: Every day | ORAL | 1 refills | Status: AC

## 2024-07-08 MED ORDER — LORAZEPAM 0.5 MG PO TABS: 0.5000 mg | ORAL_TABLET | Freq: Two times a day (BID) | ORAL | 2 refills | Status: DC | PRN

## 2024-07-08 NOTE — Addendum Note (Signed)
 Addended by: LAWERANCE ANGELINE SAILOR on: 07/08/2024 04:43 PM   Modules accepted: Level of Service

## 2024-07-08 NOTE — Progress Notes (Signed)
 Mary [name] [id number] [birth year] 58 y.o.  Subjective:   Patient ID:  Mary [name] is a 58 y.o. (DOB 29-May-1966) female.  Chief Complaint: No chief complaint on file.   HPI ALIZIA GREIF presents to the office today for follow-up of MDD, GAD and SAD.  Describes mood today as ok. Pleasant. Denies tearfulness. Mood symptoms - denies depression. Report stable interest and motivation. Reports anxiety and irritability - it comes and goes. Denies recent panic attacks. Reports some social anxiety. Denies feelings of angry. Reports she is able to control what she says better. Reports being less reactive to external situations. Mood has improved. Stating I feel like I'm doing better. Feels like current medication regimen works well. Taking medications as prescribed. Energy levels stable. Active, exercising - walking on the treadmill daily.   Enjoys some usual interests and activities. Married x 24 years. Lives with husband. Has 3 grown children. Mother local. Spending time with family. Appetite adequate. Weight stable.  Sleeping well most nights. Averages 8 hours. Difficulties with focus and concentration stable. Completing tasks. Managing aspects of household. Works full-time - [employer]. Denies SI or HI.  Denies AH or VH. Denies self harm. Denies substance use.  Previous medication trials: Wellbutrin  XL, SSRI's, Cymbalta    GAD-7    Flowsheet Row Office Visit from 08/30/2023 in [facility] Office Visit from 08/14/2023 in [facility] Office Visit from 04/12/2023 in [facility] Office Visit from 03/06/2023 in [facility] Office Visit from 01/10/2023 in [facility]  Total GAD-7 Score 3 0 4 10 2    PHQ2-9    Flowsheet Row Office Visit from 08/30/2023 in [facility] Office Visit from 08/14/2023 in [facility] Office Visit from 04/12/2023 in [facility] Office Visit from 03/06/2023 in [facility] Office Visit from 01/10/2023 in [facility]  PHQ-2 Total Score 0 0 0 4 0  PHQ-9 Total Score 2 1 2 12 1    Flowsheet Row ED to Hosp-Admission (Discharged) from 03/24/2024 in [facility] ED from 08/13/2023 in [facility] at [facility] ED from 02/18/2023 in [facility] at [facility]  C-SSRS RISK CATEGORY No Risk No Risk No Risk     Review of Systems:  Review of Systems  Musculoskeletal:  Negative for gait problem.  Neurological:  Negative for tremors.  Psychiatric/Behavioral:         Please refer to HPI    Medications: I have reviewed the patient's current medications.  Current Outpatient Medications  Medication Sig Dispense Refill   b complex vitamins capsule Take 1 capsule by mouth daily.     baclofen  (LIORESAL ) 10 MG tablet Take 0.5-1 tablets (5-10 mg total) by mouth 3 (three) times daily as needed for muscle spasms. 90 each 3   docusate sodium  (COLACE) 100 MG capsule Take 1 capsule (100 mg total) by mouth 2 (two) times daily. 60 capsule 2   estradiol  (ESTRACE ) 0.1 MG/GM vaginal cream Place 1 Applicatorful vaginally daily as needed (irritation, pain).     ferrous sulfate 325 (65 FE) MG tablet Take 325 mg by mouth daily with breakfast.     FIBER COMPLETE PO Take 1 capsule by mouth daily. Omnifiber     FLUoxetine  (PROZAC ) 20 MG capsule Take 1 capsule (20 mg total) by mouth daily.  90 capsule 1   ibuprofen  (ADVIL ) 200 MG tablet Take 400 mg by mouth every 6 (six) hours as needed for mild pain (pain score 1-3).     lamoTRIgine  (LAMICTAL ) 100 MG tablet Take 1 tablet (100 mg total) by mouth daily. 90 tablet 1   LORazepam  (ATIVAN ) 0.5 MG tablet Take 1 tablet (0.5 mg total) by mouth 2 (two) times daily as needed. 60 tablet 2    nortriptyline  (PAMELOR ) 10 MG capsule Take 1 capsule by mouth at bedtime 90 capsule 3   NP THYROID  15 MG tablet Take 15 mg by mouth daily. 15 mg and 60 mg     ondansetron  (ZOFRAN ) 4 MG tablet Take 1 tablet (4 mg total) by mouth daily as needed for nausea or vomiting. 30 tablet 1   OVER THE COUNTER MEDICATION Apply 1 Application topically daily as needed (pain). Magnesium rub     oxyCODONE  (ROXICODONE ) 5 MG immediate release tablet Take 1 tablet (5 mg total) by mouth every 6 (six) hours as needed. 8 tablet 0   progesterone  (PROMETRIUM ) 200 MG capsule Take 200 mg by mouth at bedtime.     testosterone  cypionate (DEPOTESTOSTERONE CYPIONATE) 200 MG/ML injection Inject 200 mg into the muscle once a week.     Turmeric (QC TUMERIC COMPLEX PO) Take 1 capsule by mouth daily.     valACYclovir  (VALTREX ) 500 MG tablet Take 1 tablet (500 mg total) by mouth every 12 (twelve) hours as needed.     VITAMIN D PO Take 7,500 Units by mouth daily.     No current facility-administered medications for this visit.    Medication Side Effects: None  Allergies:  Allergies  Allergen Reactions   Amoxicillin  Hives   Sulfa  Antibiotics Hives, Rash and Other (See Comments)    Lip swelling    Past Medical History:  Diagnosis Date   Fibromyalgia    Irregular heart beat 08/31/2023   Migraine    Thyroid  disease     Past Medical History, Surgical history, Social history, and Family history were reviewed and updated as appropriate.   Please see review of systems for further details on the patient's review from today.   Objective:   Physical Exam:  There were no vitals taken for this visit.  Physical Exam Constitutional:      General: She is not in acute distress. Musculoskeletal:        General: No deformity.  Neurological:     Mental Status: She is alert and oriented to person, place, and time.     Coordination: Coordination normal.  Psychiatric:        Attention and Perception: Attention and perception  normal. She does not perceive auditory or visual hallucinations.        Mood and Affect: Mood normal. Mood is not anxious or depressed. Affect is not labile, blunt, angry or inappropriate.        Speech: Speech normal.        Behavior: Behavior normal.        Thought Content: Thought content normal. Thought content is not paranoid or delusional. Thought content does not include homicidal or suicidal ideation. Thought content does not include homicidal or suicidal plan.        Cognition and Memory: Cognition and memory normal.        Judgment: Judgment normal.     Comments: Insight intact     Lab Review:     Component Value Date/Time   NA 140 03/25/2024 0233   NA  141 01/10/2023 1652   K 3.8 03/25/2024 0233   CL 106 03/25/2024 0233   CO2 25 03/25/2024 0233   GLUCOSE 85 03/25/2024 0233   BUN 11 03/25/2024 0233   BUN 17 01/10/2023 1652   CREATININE 0.80 03/25/2024 0233   CREATININE 0.68 01/27/2021 1750   CALCIUM  8.1 (L) 03/25/2024 0233   PROT 6.0 (L) 03/25/2024 0233   PROT 7.0 01/10/2023 1652   ALBUMIN 3.2 (L) 03/25/2024 0233   ALBUMIN 4.6 01/10/2023 1652   AST 38 03/25/2024 0233   ALT 62 (H) 03/25/2024 0233   ALKPHOS 67 03/25/2024 0233   BILITOT 0.7 03/25/2024 0233   BILITOT <0.2 01/10/2023 1652   GFRNONAA >60 03/25/2024 0233   GFRNONAA 100 01/27/2021 1750   GFRAA 116 01/27/2021 1750       Component Value Date/Time   WBC 6.3 03/25/2024 0233   RBC 3.99 03/25/2024 0233   HGB 12.5 03/25/2024 0233   HGB 14.4 01/10/2023 1652   HCT 37.2 03/25/2024 0233   HCT 41.9 01/10/2023 1652   PLT 265 03/25/2024 0233   PLT 350 01/10/2023 1652   MCV 93.2 03/25/2024 0233   MCV 94 01/10/2023 1652   MCH 31.3 03/25/2024 0233   MCHC 33.6 03/25/2024 0233   RDW 11.6 03/25/2024 0233   RDW 11.6 (L) 01/10/2023 1652   LYMPHSABS 1.3 01/16/2023 1055   LYMPHSABS 2.1 04/19/2018 0830   MONOABS 0.6 01/16/2023 1055   EOSABS 0.1 01/16/2023 1055   EOSABS 0.1 04/19/2018 0830   BASOSABS 0.0 01/16/2023  1055   BASOSABS 0.0 04/19/2018 0830    No results found for: POCLITH, LITHIUM   No results found for: PHENYTOIN, PHENOBARB, VALPROATE, CBMZ   .res Assessment: Plan:    Plan:  PDMP reviewed:  Lamictal  100mg  daily Prozac  20mg  daily Lorazepam  0.5mg  BID  D/C Buspar  10mg  BID   RTC 6 months  Patient advised to contact office with any questions, adverse effects, or acute worsening in signs and symptoms.  Discussed potential metabolic side effects associated with atypical antipsychotics, as well as potential risk for movement side effects. Advised pt to contact office if movement side effects occur.    Counseled patient regarding potential benefits, risks, and side effects of Lamictal  to include potential risk of Stevens-Johnson syndrome. Advised patient to stop taking Lamictal  and contact office immediately if rash develops and to seek urgent medical attention if rash is severe and/or spreading quickly.   Diagnoses and all orders for this visit:  Major depressive disorder, recurrent episode, moderate (HCC) -     lamoTRIgine  (LAMICTAL ) 100 MG tablet; Take 1 tablet (100 mg total) by mouth daily. -     FLUoxetine  (PROZAC ) 20 MG capsule; Take 1 capsule (20 mg total) by mouth daily.  Social anxiety disorder -     lamoTRIgine  (LAMICTAL ) 100 MG tablet; Take 1 tablet (100 mg total) by mouth daily. -     LORazepam  (ATIVAN ) 0.5 MG tablet; Take 1 tablet (0.5 mg total) by mouth 2 (two) times daily as needed. -     FLUoxetine  (PROZAC ) 20 MG capsule; Take 1 capsule (20 mg total) by mouth daily.     Please see After Visit Summary for patient specific instructions.  Future Appointments  Date Time Provider Department Center  08/30/2024  8:20 AM Cook, Jayce G, DO [facility]    No orders of the defined types were placed in this encounter.   -------------------------------

## 2024-08-06 ENCOUNTER — Other Ambulatory Visit: Payer: Self-pay | Admitting: Adult Health

## 2024-08-06 DIAGNOSIS — F331 Major depressive disorder, recurrent, moderate: Secondary | ICD-10-CM

## 2024-08-06 DIAGNOSIS — F4010 Social phobia, unspecified: Secondary | ICD-10-CM

## 2024-08-30 ENCOUNTER — Encounter: Admitting: Family Medicine

## 2024-09-03 ENCOUNTER — Ambulatory Visit (INDEPENDENT_AMBULATORY_CARE_PROVIDER_SITE_OTHER): Payer: Self-pay | Admitting: Family Medicine

## 2024-09-03 VITALS — BP 116/77 | HR 90 | Temp 98.1°F | Ht 63.0 in | Wt 160.0 lb

## 2024-09-03 DIAGNOSIS — E782 Mixed hyperlipidemia: Secondary | ICD-10-CM | POA: Diagnosis not present

## 2024-09-03 DIAGNOSIS — F411 Generalized anxiety disorder: Secondary | ICD-10-CM | POA: Insufficient documentation

## 2024-09-03 DIAGNOSIS — J988 Other specified respiratory disorders: Secondary | ICD-10-CM | POA: Insufficient documentation

## 2024-09-03 DIAGNOSIS — Z0000 Encounter for general adult medical examination without abnormal findings: Secondary | ICD-10-CM | POA: Insufficient documentation

## 2024-09-03 DIAGNOSIS — Z0001 Encounter for general adult medical examination with abnormal findings: Secondary | ICD-10-CM | POA: Diagnosis not present

## 2024-09-03 DIAGNOSIS — Z130 Encounter for screening for diseases of the blood and blood-forming organs and certain disorders involving the immune mechanism: Secondary | ICD-10-CM

## 2024-09-03 DIAGNOSIS — Z13228 Encounter for screening for other metabolic disorders: Secondary | ICD-10-CM

## 2024-09-03 DIAGNOSIS — F331 Major depressive disorder, recurrent, moderate: Secondary | ICD-10-CM | POA: Insufficient documentation

## 2024-09-03 DIAGNOSIS — M797 Fibromyalgia: Secondary | ICD-10-CM | POA: Diagnosis not present

## 2024-09-03 MED ORDER — DOXYCYCLINE HYCLATE 100 MG PO CAPS: 100.0000 mg | ORAL_CAPSULE | Freq: Two times a day (BID) | ORAL | 0 refills | Status: DC

## 2024-09-03 NOTE — Assessment & Plan Note (Signed)
 Patient reports that she does not feel that she has fibromyalgia.  She states that her pain is predominantly of the upper back.  She was diagnosed many years ago.  She states that she wants to be retested.  We discussed that fibromyalgia is a clinical syndrome.  I showed her widespread pain index and symptom severity scale.  Advised her that there is really no way to remove this from her medical history in regards to her life insurance policy.

## 2024-09-03 NOTE — Assessment & Plan Note (Signed)
Treating with doxycycline. 

## 2024-09-03 NOTE — Progress Notes (Signed)
 Subjective:  Patient ID: Mary [name], female    DOB: 10-Feb-1966  Age: 58 y.o. MRN: [id number]  CC:   Chief Complaint  Patient presents with   Annual Exam    Gyn is [facility] - states upto date on pap/ mammogram   Nasal Congestion    And phlegm no cough- request abx rx  Also discuss fibromyalgia dx- not accurate    HPI:  58 year old female presents for an annual exam.  Patient is due for immunizations.  She declines.  She states that her mammogram and Pap smear are up-to-date.  Need records of Pap smear.  She follows with OB[facility]GYN at [facility].  Needs labs.  Patient is concerned about the diagnosis of fibromyalgia.  She states that this diagnosis increased her life insurance premium.  She would like to discuss this today.  Additionally, patient reports that she has been sick over the past week.  She states that she is having significant cough and congestion.  Discolored sputum.  No fever.  No shortness of breath.  Patient Active Problem List   Diagnosis Date Noted   Major depressive disorder, recurrent episode, moderate (HCC) 09/03/2024   GAD (generalized anxiety disorder) 09/03/2024   Annual physical exam 09/03/2024   Respiratory infection 09/03/2024   Asymptomatic PVCs 08/31/2023   Genital herpes 05/13/2020   Social anxiety disorder 02/26/2020   Mixed hyperlipidemia 02/14/2019   Hypothyroidism 09/26/2018   Fibromyalgia affecting multiple sites 05/28/2014    Social Hx   Social History   Socioeconomic History   Marital status: Married    Spouse name: Not on file   Number of children: Not on file   Years of education: Not on file   Highest education level: Some college, no degree  Occupational History   Not on file  Tobacco Use   Smoking status: Former    Types: Cigarettes   Smokeless tobacco: Never   Tobacco comments:    quit feb 2016  Vaping Use   Vaping status: Never Used  Substance and Sexual Activity   Alcohol use: No     Alcohol/week: 0.0 standard drinks of alcohol   Drug use: No   Sexual activity: Yes    Birth control/protection: Surgical  Other Topics Concern   Not on file  Social History Narrative   Married since 1998.Lives with husband.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   Social Drivers of Corporate Investment Banker Strain: Low Risk  (09/03/2024)   Overall Financial Resource Strain (CARDIA)    Difficulty of Paying Living Expenses: Not hard at all  Food Insecurity: No Food Insecurity (09/03/2024)   Hunger Vital Sign    Worried About Running Out of Food in the Last Year: Never true    Ran Out of Food in the Last Year: Never true  Transportation Needs: No Transportation Needs (09/03/2024)   PRAPARE - Administrator, Civil Service (Medical): No    Lack of Transportation (Non-Medical): No  Physical Activity: Sufficiently Active (09/03/2024)   Exercise Vital Sign    Days of Exercise per Week: 3 days    Minutes of Exercise per Session: 60 min  Stress: Stress Concern Present (09/03/2024)   [employer] of Occupational Health - Occupational Stress Questionnaire    Feeling of Stress: To some extent  Social Connections: Moderately Isolated (09/03/2024)   Social Connection and Isolation Panel    Frequency of Communication with Friends and Family: More than three times a week  Frequency of Social Gatherings with Friends and Family: Twice a week    Attends Religious Services: Never    Diplomatic Services Operational Officer: No    Attends Engineer, Structural: Not on file    Marital Status: Married    Review of Systems Per HPI  Objective:  BP 116/77   Pulse 90   Temp 98.1 F (36.7 C)   Ht 5' 3 (1.6 m)   Wt 160 lb (72.6 kg)   SpO2 98%   BMI 28.34 kg/m      09/03/2024    9:19 AM 03/29/2024    9:05 AM 03/26/2024   12:49 PM  BP/Weight  Systolic BP 116 114 107  Diastolic BP 77 82 65  Wt. (Lbs) 160 167   BMI 28.34 kg/m2 29.58 kg/m2      Physical Exam Vitals and nursing note reviewed.  Constitutional:      General: She is not in acute distress.    Appearance: Normal appearance.  HENT:     Head: Normocephalic and atraumatic.  Eyes:     General:        Right eye: No discharge.        Left eye: No discharge.     Conjunctiva/sclera: Conjunctivae normal.  Cardiovascular:     Rate and Rhythm: Normal rate and regular rhythm.  Pulmonary:     Effort: Pulmonary effort is normal.     Breath sounds: Normal breath sounds. No wheezing, rhonchi or rales.  Neurological:     Mental Status: She is alert.  Psychiatric:        Mood and Affect: Mood normal.        Behavior: Behavior normal.     Lab Results  Component Value Date   WBC 6.3 03/25/2024   HGB 12.5 03/25/2024   HCT 37.2 03/25/2024   PLT 265 03/25/2024   GLUCOSE 85 03/25/2024   CHOL 208 (H) 01/10/2023   TRIG 157 (H) 01/10/2023   HDL 47 01/10/2023   LDLCALC 133 (H) 01/10/2023   ALT 62 (H) 03/25/2024   AST 38 03/25/2024   NA 140 03/25/2024   K 3.8 03/25/2024   CL 106 03/25/2024   CREATININE 0.80 03/25/2024   BUN 11 03/25/2024   CO2 25 03/25/2024   TSH 0.071 (L) 01/10/2023   HGBA1C 5.4 01/27/2021     Assessment & Plan:  Annual physical exam Assessment & Plan: Health maintenance updated in the chart.  Declines immunizations.  Need record of Pap smear.  Labs today.   Screening for deficiency anemia -     CBC  Screening for metabolic disorder -     CMP14+EGFR  Mixed hyperlipidemia -     Lipid panel  Respiratory infection Assessment & Plan: Treating with doxycycline .  Orders: -     Doxycycline  Hyclate; Take 1 capsule (100 mg total) by mouth 2 (two) times daily.  Dispense: 14 capsule; Refill: 0  Fibromyalgia affecting multiple sites Assessment & Plan: Patient reports that she does not feel that she has fibromyalgia.  She states that her pain is predominantly of the upper back.  She was diagnosed many years ago.  She states that she wants  to be retested.  We discussed that fibromyalgia is a clinical syndrome.  I showed her widespread pain index and symptom severity scale.  Advised her that there is really no way to remove this from her medical history in regards to her life insurance policy.     Follow-up: Annually  Jacqulyn Ahle DO [facility]

## 2024-09-03 NOTE — Assessment & Plan Note (Signed)
 Health maintenance updated in the chart.  Declines immunizations.  Need record of Pap smear.  Labs today.

## 2024-09-03 NOTE — Patient Instructions (Signed)
Labs today.  Follow up annually.  Take care  Dr. Raylen Tangonan  

## 2024-09-05 ENCOUNTER — Telehealth: Payer: Self-pay | Admitting: Adult Health

## 2024-09-05 NOTE — Telephone Encounter (Signed)
 Pt called to see if she can taper off of the Lamictal . She doesn't feel like she can focus, very tired and seems to be worse. Thinks it is the lamictal . Wants to discuss this.

## 2024-09-05 NOTE — Telephone Encounter (Signed)
 LVM to [facility]. She has been on Lamictal  long-term, no recent dose increase.

## 2024-09-05 NOTE — Telephone Encounter (Signed)
 LVM to [facility]

## 2024-09-10 NOTE — Telephone Encounter (Signed)
 Pt has a new counselor and she is recommending she come off some of her medications. She has an appointment with her tomorrow and will discuss with her and let us  know.

## 2024-09-18 LAB — CBC
Hematocrit: 44.4 % (ref 34.0–46.6)
Hemoglobin: 14.5 g/dL (ref 11.1–15.9)
MCH: 32.4 pg (ref 26.6–33.0)
MCHC: 32.7 g/dL (ref 31.5–35.7)
MCV: 99 fL — ABNORMAL HIGH (ref 79–97)
Platelets: 331 x10E3/uL (ref 150–450)
RBC: 4.47 x10E6/uL (ref 3.77–5.28)
RDW: 11.5 % — ABNORMAL LOW (ref 11.7–15.4)
WBC: 6 x10E3/uL (ref 3.4–10.8)

## 2024-09-18 LAB — LIPID PANEL
Chol/HDL Ratio: 5.1 ratio — ABNORMAL HIGH (ref 0.0–4.4)
Cholesterol, Total: 224 mg/dL — ABNORMAL HIGH (ref 100–199)
HDL: 44 mg/dL (ref >39)
LDL Chol Calc (NIH): 161 mg/dL — ABNORMAL HIGH (ref 0–99)
Triglycerides: 103 mg/dL (ref 0–149)
VLDL Cholesterol Cal: 19 mg/dL (ref 5–40)

## 2024-09-18 LAB — CMP14+EGFR
ALT: 17 IU/L (ref 0–32)
AST: 15 IU/L (ref 0–40)
Albumin: 4.3 g/dL (ref 3.8–4.9)
Alkaline Phosphatase: 87 IU/L (ref 49–135)
BUN/Creatinine Ratio: 18 (ref 9–23)
BUN: 17 mg/dL (ref 6–24)
Bilirubin Total: 0.4 mg/dL (ref 0.0–1.2)
CO2: 22 mmol/L (ref 20–29)
Calcium: 9 mg/dL (ref 8.7–10.2)
Chloride: 102 mmol/L (ref 96–106)
Creatinine, Ser: 0.93 mg/dL (ref 0.57–1.00)
Globulin, Total: 2.8 g/dL (ref 1.5–4.5)
Glucose: 84 mg/dL (ref 70–99)
Potassium: 4.6 mmol/L (ref 3.5–5.2)
Sodium: 138 mmol/L (ref 134–144)
Total Protein: 7.1 g/dL (ref 6.0–8.5)
eGFR: 72 mL/min/1.73 (ref >59)

## 2024-09-21 ENCOUNTER — Ambulatory Visit: Payer: Self-pay | Admitting: Family Medicine

## 2024-10-09 ENCOUNTER — Encounter: Payer: Self-pay | Admitting: Family Medicine

## 2024-10-11 ENCOUNTER — Ambulatory Visit: Admitting: Nurse Practitioner

## 2024-10-11 ENCOUNTER — Encounter: Payer: Self-pay | Admitting: Nurse Practitioner

## 2024-10-11 ENCOUNTER — Ambulatory Visit: Payer: Self-pay

## 2024-10-11 VITALS — BP 115/79 | HR 101 | Temp 98.4°F | Ht 63.0 in | Wt 158.8 lb

## 2024-10-11 DIAGNOSIS — F411 Generalized anxiety disorder: Secondary | ICD-10-CM | POA: Diagnosis not present

## 2024-10-11 DIAGNOSIS — F4010 Social phobia, unspecified: Secondary | ICD-10-CM

## 2024-10-11 DIAGNOSIS — M797 Fibromyalgia: Secondary | ICD-10-CM

## 2024-10-11 DIAGNOSIS — E063 Autoimmune thyroiditis: Secondary | ICD-10-CM

## 2024-10-11 DIAGNOSIS — Z0279 Encounter for issue of other medical certificate: Secondary | ICD-10-CM

## 2024-10-11 NOTE — Telephone Encounter (Signed)
 FYI Only or Action Required?: Action required by provider: request for appointment.  Patient was last seen in primary care on 09/03/2024 by [name], Mary G, DO.  Called Nurse Triage reporting Panic Attack.  Symptoms began yesterday.  Interventions attempted: Nothing.  Symptoms are: unchanged. Had a panic attack at work yesterday, had to call 911. HR elevated. Sees a therapist for PTSD. Denies any thoughts of self-harm.   Triage Disposition: See Physician Within 24 Hours  Patient/caregiver understands and will follow disposition?: Yes     Copied from CRM #[id number]. Topic: Clinical - Red Word Triage >> Oct 11, 2024  7:43 AM [name] wrote: Red Word that prompted transfer to Nurse Triage:  Something happened at work to make her start having panic attacks real bad;  she almost pasted out and call 911; High heart rate. Reason for Disposition  Patient sounds very upset or troubled to the triager  Answer Assessment - Initial Assessment Questions 1. CONCERN: Did anything happen that prompted you to call today?      panic 2. ANXIETY SYMPTOMS: Can you describe how you (your loved one; patient) have been feeling? (e.[name]., tense, restless, panicky, anxious, keyed up, overwhelmed, sense of impending doom).      overwhelmed 3. ONSET: How long have you been feeling this way? (e.[name]., hours, days, weeks)     yesterday 4. SEVERITY: How would you rate the level of anxiety? (e.[name]., 0 - 10; or mild, moderate, severe).     Severe - called 911 5. FUNCTIONAL IMPAIRMENT: How have these feelings affected your ability to do daily activities? Have you had more difficulty than usual doing your normal daily activities? (e.[name]., getting better, same, worse; self-care, school, work, interactions)     Yes - stress at work 6. HISTORY: Have you felt this way before? Have you ever been diagnosed with an anxiety problem in the past? (e.[name]., generalized anxiety disorder, panic attacks, PTSD). If Yes, ask: How  was this problem treated? (e.[name]., medicines, counseling, etc.)     no 7. RISK OF HARM - SUICIDAL IDEATION: Do you ever have thoughts of hurting or killing yourself? If Yes, ask:  Do you have these feelings now? Do you have a plan on how you would do this?     no 8. TREATMENT:  What has been done so far to treat this anxiety? (e.[name]., medicines, relaxation strategies). What has helped?     no 9. THERAPIST: Do you have a counselor or therapist? If Yes, ask: What is their name?     yes 10. POTENTIAL TRIGGERS: Do you drink caffeinated beverages (e.[name]., coffee, colas, teas), and how much daily? Do you drink alcohol or use any drugs? Have you started any new medicines recently?       no 11. PATIENT SUPPORT: Who is with you now? Who do you live with? Do you have family or friends who you can talk to?        yes 12. OTHER SYMPTOMS: Do you have any other symptoms? (e.[name]., feeling depressed, trouble concentrating, trouble sleeping, trouble breathing, palpitations or fast heartbeat, chest pain, sweating, nausea, or diarrhea)       Rapid heart rate 13. PREGNANCY: Is there any chance you are pregnant? When was your last menstrual period?       no  Protocols used: Anxiety and Panic Attack-A-AH

## 2024-10-14 ENCOUNTER — Encounter: Payer: Self-pay | Admitting: Nurse Practitioner

## 2024-10-14 MED ORDER — BACLOFEN 10 MG PO TABS: 5.0000 mg | ORAL_TABLET | Freq: Three times a day (TID) | ORAL | 3 refills | Status: AC | PRN

## 2024-10-14 NOTE — Progress Notes (Signed)
 "  Subjective:    Patient ID: Mary [name], female    DOB: Jun 05, 1966, 58 y.o.   MRN: [id number]  HPI Discussed the use of AI scribe software for clinical note transcription with the patient, who gave verbal consent to proceed.  History of Present Illness Mary [name] is a 58 year old female who presents with issues related to work accommodations and [employer].  In early June, she experienced excruciating pain between her shoulder blades and was admitted to the ER. She was told by ER staff that she had gallbladder issues and acute pancreatitis after testing. She underwent a robotic cholecystectomy two days later. After surgery, she was informed that she had lingering pancreatitis but no necrosis.  She has major depressive episodes and heightened anxiety, exacerbated by work-related stress. She attributes some of her anxiety to a writer at work who is less accommodating of her medical conditions, including fibromyalgia and Hashimoto's thyroiditis. A recent incident involved a panic attack after a meeting with her manager, requiring EMS intervention due to an elevated heart rate.  She is currently on thyroid  medication, which she reports is finally effective. She recently had blood work done for her thyroid  condition. Regular follow up with endocrinology. She also takes baclofen  for fibromyalgia pain, prescribed by Dr. Bluford.  She experiences panic attacks, described as 'horrible', often requiring her to miss work. She has panic attacks about three to four times a month, typically lasting a full day. She is seeing a licensed visual merchandiser for counseling and has upcoming appointments with her endocrinologist and psychiatrist.  She works as an armed forces technical officer in billing and faces challenges with her employer regarding FMLA and work accommodations.   Review of Systems  Constitutional:  Positive for fatigue.  Respiratory:  Positive for chest tightness and shortness of  breath. Negative for cough.        Chest symptoms only with panic attacks or extreme anxiety.   Cardiovascular:  Positive for chest pain and palpitations.  Psychiatric/Behavioral:  Positive for decreased concentration, dysphoric mood and sleep disturbance. Negative for suicidal ideas. The patient is nervous/anxious.       10/11/2024    3:57 PM  Depression screen PHQ 2/9  Decreased Interest 3  Down, Depressed, Hopeless 3  PHQ - 2 Score 6  Altered sleeping 3  Tired, decreased energy 3  Change in appetite 3  Feeling bad or failure about yourself  3  Trouble concentrating 3  Moving slowly or fidgety/restless 0  Suicidal thoughts 0  PHQ-9 Score 21  Difficult doing work/chores Extremely dIfficult      10/11/2024    3:57 PM 10/11/2024    1:45 PM 08/30/2023   10:56 AM 08/14/2023    4:08 PM  GAD 7 : Generalized Anxiety Score  Nervous, Anxious, on Edge 3 3 1  0  Control/stop worrying 3 3 0 0  Worry too much - different things 3 3 0 0  Trouble relaxing 3 3 1  0  Restless 0 0 0 0  Easily annoyed or irritable 1 1 1  0  Afraid - awful might happen 3 3 0 0  Total GAD 7 Score 16 16 3  0  Anxiety Difficulty Extremely difficult Not difficult at all      Social History[1]      Objective:   Physical Exam NAD.  Alert, oriented.  Very anxious affect.  Tearful at times.  Making good eye contact.  Speech clear.  Normal cognition.  Normal thought and  behavior.  Dressed appropriately for the weather.  Lungs clear.  Heart regular rate rhythm. Today's Vitals   10/11/24 1333  BP: 115/79  Pulse: (!) 101  Temp: 98.4 F (36.9 C)  SpO2: 96%  Weight: 158 lb 12.8 oz (72 kg)  Height: 5' 3 (1.6 m)  PainSc: 0-No pain   Body mass index is 28.13 kg/m.        Assessment & Plan:  1. Fibromyalgia affecting multiple sites (Primary) Chronic pain exacerbated by stress. Prefers non-pharmacological management. - Refilled baclofen  prescription. - Discussed workplace accommodations for stress-related  pain.  - baclofen  (LIORESAL ) 10 MG tablet; Take 0.5-1 tablets (5-10 mg total) by mouth 3 (three) times daily as needed for muscle spasms.  Dispense: 90 each; Refill: 3  2. GAD (generalized anxiety disorder) Exacerbation due to workplace stress. Required emergency intervention for panic attack. - Continue weekly psychotherapy and follow up with psychiatrist. - Discuss workplace accommodations with HR.  3. Social anxiety disorder Exacerbation due to workplace stress. Required emergency intervention for panic attack. - Continue weekly psychotherapy and follow up with psychiatrist. - Discuss workplace accommodations with HR.  4. Hypothyroidism due to Hashimoto thyroiditis Managed with thyroid  medication. Recent blood work completed. - Continue current thyroid  medication. - Attend endocrinology appointment January 5th, 2026.   Return if symptoms worsen or fail to improve.  I personally spent a total of 30 minutes in the care of the patient today including getting/reviewing separately obtained history, performing a medically appropriate exam/evaluation, counseling and educating, referring and communicating with other health care professionals, documenting clinical information in the EHR, communicating results, and completing paperwork for her employer.         [1]  Social History Tobacco Use   Smoking status: Former    Types: Cigarettes   Smokeless tobacco: Never   Tobacco comments:    quit feb 2016  Vaping Use   Vaping status: Never Used  Substance Use Topics   Alcohol use: No    Alcohol/week: 0.0 standard drinks of alcohol   Drug use: No   "

## 2024-10-15 NOTE — Telephone Encounter (Signed)
 Copied from CRM #[id number]. Topic: General - Other >> Oct 10, 2024  1:06 PM Santiya F wrote: Reason for CRM: Patient is calling in because she wants to know if Dr. Bluford will fill out FMLA papers for her. Patient says due to her medical conditions she has to sometimes miss work and her employer is giving her a hard time about it. Patient wants to know do her conditions (fibromyalgia and another condition) qualify her for FMLA. Patient is requesting a call back.

## 2024-10-28 NOTE — Telephone Encounter (Signed)
 Copied from CRM #[id number]. Topic: General - Other >> Oct 28, 2024  3:01 PM Tiffany B wrote: Reason for CRM: Patient unsure if an appointment Is needed to have her ADA forms updated. Caller states employeer is requesting additional information, patient will drop off forms. Please advise patient if an appointment will be needed. Patient last seen 10/11/2024 and the ADA forms were filled out by NP, [facility].

## 2024-10-29 ENCOUNTER — Encounter: Payer: Self-pay | Admitting: Family Medicine

## 2024-10-30 ENCOUNTER — Encounter: Payer: Self-pay | Admitting: Family Medicine

## 2024-10-30 ENCOUNTER — Ambulatory Visit: Admitting: Family Medicine

## 2024-10-30 VITALS — BP 131/81 | HR 85 | Temp 97.0°F | Ht 63.0 in | Wt 161.0 lb

## 2024-10-30 DIAGNOSIS — F411 Generalized anxiety disorder: Secondary | ICD-10-CM | POA: Diagnosis not present

## 2024-10-30 DIAGNOSIS — M797 Fibromyalgia: Secondary | ICD-10-CM | POA: Diagnosis not present

## 2024-10-30 MED ORDER — CLONAZEPAM 0.5 MG PO TABS: 0.5000 mg | ORAL_TABLET | Freq: Two times a day (BID) | ORAL | 1 refills | Status: AC | PRN

## 2024-10-30 NOTE — Assessment & Plan Note (Signed)
 Discontinue lorazepam . Trial of clonazepam .

## 2024-10-30 NOTE — Assessment & Plan Note (Addendum)
 Recent exacerbation due to stress and issues regarding her workplace.  Will fill out paperwork today.  We had a lengthy discussion today regarding her functional status and her ability to work.  There is a time where she has increasing pain

## 2024-10-30 NOTE — Patient Instructions (Signed)
 I will fill out the form as soon as I can.  Medication as needed.  Take care  Dr. Bluford

## 2024-10-30 NOTE — Progress Notes (Signed)
 "  Subjective:  Patient ID: Mary [name], female    DOB: 1966-04-26  Age: 59 y.o. MRN: [id number]  CC:   Chief Complaint  Patient presents with   paperwork    ADA paperwork follow up - job requesting more information be filled out. Pt wants to talk about mental health status, states she is having a hard time focusing, cannot eat or sleep. Do keep the Ativan  with her but prefers not to take it because it make she sleepy, any other option that would not make her sleepy?    HPI:  59 year old female with depression, anxiety, fibromyalgia presents for evaluation of the above.  Patient recently seen on 12/19.  She is having difficulty with work and her employer due to her underlying medical conditions.  Forms have been filled out for accommodations/absences at work.  An additional form needs to be filled out.  This is an ADA medical clarification form regarding flares.  Patient reports that she has daily pain and has intermittent periods of time where she finds it difficult to work due to pain.  Additionally, she has significant anxiety and depression which also plays a role in her symptoms and need for accommodations at work.  We will discuss paperwork today and her needs regarding this.  Patient reports that she recently had severe anxiety at work and an ambulance had to be called.  She states that she has been prescribed Ativan  but states that this medication makes her excessively tired/sleepy.  She would like to discuss whether debilitation would benefit her.  Patient Active Problem List   Diagnosis Date Noted   Major depressive disorder, recurrent episode, moderate (HCC) 09/03/2024   GAD (generalized anxiety disorder) 09/03/2024   Asymptomatic PVCs 08/31/2023   Genital herpes 05/13/2020   Social anxiety disorder 02/26/2020   Mixed hyperlipidemia 02/14/2019   Hypothyroidism 09/26/2018   Fibromyalgia affecting multiple sites 05/28/2014    Social Hx   Social History    Socioeconomic History   Marital status: Married    Spouse name: Not on file   Number of children: Not on file   Years of education: Not on file   Highest education level: Some college, no degree  Occupational History   Not on file  Tobacco Use   Smoking status: Former    Types: Cigarettes   Smokeless tobacco: Never   Tobacco comments:    quit feb 2016  Vaping Use   Vaping status: Never Used  Substance and Sexual Activity   Alcohol use: No    Alcohol/week: 0.0 standard drinks of alcohol   Drug use: No   Sexual activity: Yes    Birth control/protection: Surgical  Other Topics Concern   Not on file  Social History Narrative   Married since 1998.Lives with husband.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   Social Drivers of Health   Tobacco Use: Medium Risk (10/30/2024)   Patient History    Smoking Tobacco Use: Former    Smokeless Tobacco Use: Never    Passive Exposure: Not on file  Financial Resource Strain: Low Risk (09/03/2024)   Overall Financial Resource Strain (CARDIA)    Difficulty of Paying Living Expenses: Not hard at all  Food Insecurity: No Food Insecurity (09/03/2024)   Epic    Worried About Programme Researcher, Broadcasting/film/video in the Last Year: Never true    Ran Out of Food in the Last Year: Never true  Transportation Needs: No Transportation Needs (09/03/2024)   Epic    Lack  of Transportation (Medical): No    Lack of Transportation (Non-Medical): No  Physical Activity: Sufficiently Active (09/03/2024)   Exercise Vital Sign    Days of Exercise per Week: 3 days    Minutes of Exercise per Session: 60 min  Stress: Stress Concern Present (09/03/2024)   [employer] of Occupational Health - Occupational Stress Questionnaire    Feeling of Stress: To some extent  Social Connections: Moderately Isolated (09/03/2024)   Social Connection and Isolation Panel    Frequency of Communication with Friends and Family: More than three times a week     Frequency of Social Gatherings with Friends and Family: Twice a week    Attends Religious Services: Never    Database Administrator or Organizations: No    Attends Engineer, Structural: Not on file    Marital Status: Married  Depression (PHQ2-9): Medium Risk (10/30/2024)   Depression (PHQ2-9)    PHQ-2 Score: 6  Alcohol Screen: Not on file  Housing: Low Risk (09/03/2024)   Epic    Unable to Pay for Housing in the Last Year: No    Number of Times Moved in the Last Year: 0    Homeless in the Last Year: No  Utilities: Not At Risk (03/24/2024)   AHC Utilities    Threatened with loss of utilities: No  Health Literacy: Not on file    Review of Systems Per HPI  Objective:  BP 131/81   Pulse 85   Temp (!) 97 F (36.1 C)   Ht 5' 3 (1.6 m)   Wt 161 lb (73 kg)   SpO2 96%   BMI 28.52 kg/m      10/30/2024    9:07 AM 10/11/2024    1:33 PM 09/03/2024    9:19 AM  BP/Weight  Systolic BP 131 115 116  Diastolic BP 81 79 77  Wt. (Lbs) 161 158.8 160  BMI 28.52 kg/m2 28.13 kg/m2 28.34 kg/m2    Physical Exam  Lab Results  Component Value Date   WBC 6.0 09/17/2024   HGB 14.5 09/17/2024   HCT 44.4 09/17/2024   PLT 331 09/17/2024   GLUCOSE 84 09/17/2024   CHOL 224 (H) 09/17/2024   TRIG 103 09/17/2024   HDL 44 09/17/2024   LDLCALC 161 (H) 09/17/2024   ALT 17 09/17/2024   AST 15 09/17/2024   NA 138 09/17/2024   K 4.6 09/17/2024   CL 102 09/17/2024   CREATININE 0.93 09/17/2024   BUN 17 09/17/2024   CO2 22 09/17/2024   TSH 0.071 (L) 01/10/2023   HGBA1C 5.4 01/27/2021     Assessment & Plan:  Fibromyalgia affecting multiple sites Assessment & Plan: Recent exacerbation due to stress and issues regarding her workplace.  Will fill out paperwork today.  We had a lengthy discussion today regarding her functional status and her ability to work.  There is a time where she has increasing pain   GAD (generalized anxiety disorder) Assessment & Plan: Discontinue  lorazepam . Trial of clonazepam .  Orders: -     clonazePAM ; Take 1 tablet (0.5 mg total) by mouth 2 (two) times daily as needed for anxiety.  Dispense: 20 tablet; Refill: 1   Julen Rubert DO [facility] "

## 2024-11-19 ENCOUNTER — Ambulatory Visit: Admitting: Family Medicine

## 2024-11-20 ENCOUNTER — Telehealth: Payer: Self-pay | Admitting: Adult Health

## 2024-11-20 ENCOUNTER — Ambulatory Visit (INDEPENDENT_AMBULATORY_CARE_PROVIDER_SITE_OTHER): Admitting: Family Medicine

## 2024-11-20 ENCOUNTER — Encounter: Payer: Self-pay | Admitting: Family Medicine

## 2024-11-20 VITALS — BP 131/82 | HR 96 | Temp 97.9°F | Ht 63.0 in | Wt 162.4 lb

## 2024-11-20 DIAGNOSIS — J019 Acute sinusitis, unspecified: Secondary | ICD-10-CM

## 2024-11-20 MED ORDER — DOXYCYCLINE HYCLATE 100 MG PO TABS: 100.0000 mg | ORAL_TABLET | Freq: Two times a day (BID) | ORAL | 0 refills | Status: AC

## 2024-11-20 NOTE — Assessment & Plan Note (Signed)
Treating with doxycycline. 

## 2024-11-20 NOTE — Progress Notes (Signed)
 "  Subjective:  Patient ID: Mary [name], female    DOB: Dec 29, 1965  Age: 59 y.o. MRN: [id number]  CC:  Respiratory symptoms  HPI:  59 year old female presents for evaluation of the above.  Patient ports she has been sick for the past several days.  Fever.  Reports headache, pain and pressure, congestion.  She has taken Tylenol  with some improvement.  She has had some recent sick contacts.  She has recent missed work as a result of her infection and has been fired.   Patient Active Problem List   Diagnosis Date Noted   Acute sinusitis 11/20/2024   Major depressive disorder, recurrent episode, moderate (HCC) 09/03/2024   GAD (generalized anxiety disorder) 09/03/2024   Asymptomatic PVCs 08/31/2023   Genital herpes 05/13/2020   Social anxiety disorder 02/26/2020   Mixed hyperlipidemia 02/14/2019   Hypothyroidism 09/26/2018   Fibromyalgia affecting multiple sites 05/28/2014    Social Hx   Social History   Socioeconomic History   Marital status: Married    Spouse name: Not on file   Number of children: Not on file   Years of education: Not on file   Highest education level: Some college, no degree  Occupational History   Not on file  Tobacco Use   Smoking status: Former    Types: Cigarettes   Smokeless tobacco: Never   Tobacco comments:    quit feb 2016  Vaping Use   Vaping status: Never Used  Substance and Sexual Activity   Alcohol use: No    Alcohol/week: 0.0 standard drinks of alcohol   Drug use: No   Sexual activity: Yes    Birth control/protection: Surgical  Other Topics Concern   Not on file  Social History Narrative   Married since 1998.Lives with husband.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   Social Drivers of Health   Tobacco Use: Medium Risk (11/20/2024)   Patient History    Smoking Tobacco Use: Former    Smokeless Tobacco Use: Never    Passive Exposure: Not on file  Financial Resource Strain: Low Risk (09/03/2024)    Overall Financial Resource Strain (CARDIA)    Difficulty of Paying Living Expenses: Not hard at all  Food Insecurity: No Food Insecurity (09/03/2024)   Epic    Worried About Programme Researcher, Broadcasting/film/video in the Last Year: Never true    Ran Out of Food in the Last Year: Never true  Transportation Needs: No Transportation Needs (09/03/2024)   Epic    Lack of Transportation (Medical): No    Lack of Transportation (Non-Medical): No  Physical Activity: Sufficiently Active (09/03/2024)   Exercise Vital Sign    Days of Exercise per Week: 3 days    Minutes of Exercise per Session: 60 min  Stress: Stress Concern Present (09/03/2024)   [employer] of Occupational Health - Occupational Stress Questionnaire    Feeling of Stress: To some extent  Social Connections: Moderately Isolated (09/03/2024)   Social Connection and Isolation Panel    Frequency of Communication with Friends and Family: More than three times a week    Frequency of Social Gatherings with Friends and Family: Twice a week    Attends Religious Services: Never    Database Administrator or Organizations: No    Attends Banker Meetings: Not on file    Marital Status: Married  Depression (PHQ2-9): Medium Risk (11/20/2024)   Depression (PHQ2-9)    PHQ-2 Score: 6  Alcohol Screen: Not on file  Housing: Low  Risk (09/03/2024)   Epic    Unable to Pay for Housing in the Last Year: No    Number of Times Moved in the Last Year: 0    Homeless in the Last Year: No  Utilities: Not At Risk (03/24/2024)   A[employer] Utilities    Threatened with loss of utilities: No  Health Literacy: Not on file    Review of Systems Per HPI  Objective:  BP 131/82   Pulse 96   Temp 97.9 F (36.6 C)   Ht 5' 3 (1.6 m)   Wt 162 lb 6.4 oz (73.7 kg)   SpO2 96%   BMI 28.77 kg/m      11/20/2024    2:07 PM 10/30/2024    9:07 AM 10/11/2024    1:33 PM  BP/Weight  Systolic BP 131 131 115  Diastolic BP 82 81 79  Wt. (Lbs) 162.4 161 158.8  BMI  28.77 kg/m2 28.52 kg/m2 28.13 kg/m2    Physical Exam Constitutional:      General: She is not in acute distress. HENT:     Head: Normocephalic and atraumatic.     Right Ear: Tympanic membrane normal.     Left Ear: Tympanic membrane normal.     Nose:     Comments: Maxillary sinus and frontal sinus tenderness to palpation.    Mouth/Throat:     Pharynx: Oropharynx is clear.  Cardiovascular:     Rate and Rhythm: Normal rate and regular rhythm.  Pulmonary:     Effort: Pulmonary effort is normal.     Breath sounds: Normal breath sounds. No wheezing or rales.  Neurological:     Mental Status: She is alert.     Lab Results  Component Value Date   WBC 6.0 09/17/2024   HGB 14.5 09/17/2024   HCT 44.4 09/17/2024   PLT 331 09/17/2024   GLUCOSE 84 09/17/2024   CHOL 224 (H) 09/17/2024   TRIG 103 09/17/2024   HDL 44 09/17/2024   LDLCALC 161 (H) 09/17/2024   ALT 17 09/17/2024   AST 15 09/17/2024   NA 138 09/17/2024   K 4.6 09/17/2024   CL 102 09/17/2024   CREATININE 0.93 09/17/2024   BUN 17 09/17/2024   CO2 22 09/17/2024   TSH 0.071 (L) 01/10/2023   HGBA1C 5.4 01/27/2021     Assessment & Plan:  Acute sinusitis, recurrence not specified, unspecified location Assessment & Plan: Treating with doxycycline .  Orders: -     Doxycycline  Hyclate; Take 1 tablet (100 mg total) by mouth 2 (two) times daily.  Dispense: 20 tablet; Refill: 0    Follow-up:  Return if symptoms worsen or fail to improve.  Jacqulyn Ahle DO [facility] "

## 2024-11-20 NOTE — Telephone Encounter (Signed)
 Called patient and she is changing jobs and after January will not have insurance until June. She wanted to do a virtual with you by the end of the month, but you don't have availability. She asks to push appt out until June and she is concerned about refilling her medication by waiting until June for FU. She was last seen in Sept.

## 2024-11-20 NOTE — Patient Instructions (Signed)
 Rest. Fluids.  Medication as prescribed.   Take care  Dr. Adriana Simas

## 2024-11-20 NOTE — Telephone Encounter (Signed)
 Pt notified that scripts will be filled until June. She has a FU scheduled.

## 2024-11-20 NOTE — Telephone Encounter (Signed)
 FYI Pt called reporting new job and ins not eff until June. F/U due March. Need to move apt out 3 months. Concerned with getting meds during this time.  New apt 6/10.

## 2024-12-10 ENCOUNTER — Ambulatory Visit: Admitting: Adult Health

## 2025-04-02 ENCOUNTER — Telehealth: Admitting: Adult Health

## 2025-09-04 ENCOUNTER — Encounter: Admitting: Family Medicine
